# Patient Record
Sex: Female | Born: 1980 | Race: Black or African American | Hispanic: No | Marital: Single | Smoking: Current every day smoker
Health system: Southern US, Community
[De-identification: ages and names within clinical notes are randomized; demographics above are authoritative.]

## PROBLEM LIST (undated history)

## (undated) DIAGNOSIS — K8020 Calculus of gallbladder without cholecystitis without obstruction: Secondary | ICD-10-CM

## (undated) DIAGNOSIS — A549 Gonococcal infection, unspecified: Secondary | ICD-10-CM

## (undated) DIAGNOSIS — Z202 Contact with and (suspected) exposure to infections with a predominantly sexual mode of transmission: Secondary | ICD-10-CM

## (undated) DIAGNOSIS — R51 Headache: Secondary | ICD-10-CM

## (undated) DIAGNOSIS — D649 Anemia, unspecified: Secondary | ICD-10-CM

## (undated) DIAGNOSIS — R011 Cardiac murmur, unspecified: Secondary | ICD-10-CM

## (undated) HISTORY — PX: TUBAL LIGATION: SHX77

## (undated) HISTORY — PX: TONSILLECTOMY: SUR1361

---
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
## 2002-02-10 ENCOUNTER — Inpatient Hospital Stay (HOSPITAL_COMMUNITY): Admission: AD | Admit: 2002-02-10 | Discharge: 2002-02-10 | Payer: Self-pay | Admitting: Obstetrics and Gynecology

## 2002-03-07 ENCOUNTER — Inpatient Hospital Stay (HOSPITAL_COMMUNITY): Admission: AD | Admit: 2002-03-07 | Discharge: 2002-03-07 | Payer: Self-pay | Admitting: Obstetrics and Gynecology

## 2002-03-17 ENCOUNTER — Inpatient Hospital Stay (HOSPITAL_COMMUNITY): Admission: AD | Admit: 2002-03-17 | Discharge: 2002-03-17 | Payer: Self-pay | Admitting: Obstetrics and Gynecology

## 2002-03-20 ENCOUNTER — Inpatient Hospital Stay (HOSPITAL_COMMUNITY): Admission: AD | Admit: 2002-03-20 | Discharge: 2002-03-20 | Payer: Self-pay | Admitting: Obstetrics and Gynecology

## 2002-03-21 ENCOUNTER — Inpatient Hospital Stay (HOSPITAL_COMMUNITY): Admission: AD | Admit: 2002-03-21 | Discharge: 2002-03-21 | Payer: Self-pay | Admitting: Obstetrics and Gynecology

## 2002-03-30 ENCOUNTER — Inpatient Hospital Stay (HOSPITAL_COMMUNITY): Admission: AD | Admit: 2002-03-30 | Discharge: 2002-04-01 | Payer: Self-pay | Admitting: Obstetrics and Gynecology

## 2002-04-06 ENCOUNTER — Inpatient Hospital Stay (HOSPITAL_COMMUNITY): Admission: AD | Admit: 2002-04-06 | Discharge: 2002-04-06 | Payer: Self-pay | Admitting: Obstetrics and Gynecology

## 2002-11-22 ENCOUNTER — Ambulatory Visit (HOSPITAL_COMMUNITY): Admission: RE | Admit: 2002-11-22 | Discharge: 2002-11-22 | Payer: Self-pay | Admitting: Obstetrics and Gynecology

## 2002-11-22 ENCOUNTER — Other Ambulatory Visit: Admission: RE | Admit: 2002-11-22 | Discharge: 2002-11-22 | Payer: Self-pay | Admitting: Obstetrics and Gynecology

## 2002-11-22 ENCOUNTER — Encounter: Payer: Self-pay | Admitting: Obstetrics and Gynecology

## 2002-12-12 ENCOUNTER — Inpatient Hospital Stay (HOSPITAL_COMMUNITY): Admission: AD | Admit: 2002-12-12 | Discharge: 2002-12-12 | Payer: Self-pay | Admitting: Obstetrics and Gynecology

## 2003-01-30 ENCOUNTER — Inpatient Hospital Stay (HOSPITAL_COMMUNITY): Admission: AD | Admit: 2003-01-30 | Discharge: 2003-01-30 | Payer: Self-pay | Admitting: Obstetrics and Gynecology

## 2003-04-25 ENCOUNTER — Inpatient Hospital Stay (HOSPITAL_COMMUNITY): Admission: AD | Admit: 2003-04-25 | Discharge: 2003-04-25 | Payer: Self-pay | Admitting: Obstetrics and Gynecology

## 2003-05-22 ENCOUNTER — Inpatient Hospital Stay (HOSPITAL_COMMUNITY): Admission: AD | Admit: 2003-05-22 | Discharge: 2003-05-22 | Payer: Self-pay | Admitting: Obstetrics and Gynecology

## 2003-05-23 ENCOUNTER — Inpatient Hospital Stay (HOSPITAL_COMMUNITY): Admission: AD | Admit: 2003-05-23 | Discharge: 2003-05-23 | Payer: Self-pay | Admitting: Obstetrics and Gynecology

## 2003-05-24 ENCOUNTER — Inpatient Hospital Stay (HOSPITAL_COMMUNITY): Admission: AD | Admit: 2003-05-24 | Discharge: 2003-05-26 | Payer: Self-pay | Admitting: Obstetrics and Gynecology

## 2003-11-29 ENCOUNTER — Other Ambulatory Visit: Admission: RE | Admit: 2003-11-29 | Discharge: 2003-11-29 | Payer: Self-pay | Admitting: Obstetrics and Gynecology

## 2004-04-21 ENCOUNTER — Emergency Department (HOSPITAL_COMMUNITY): Admission: EM | Admit: 2004-04-21 | Discharge: 2004-04-21 | Payer: Self-pay | Admitting: Family Medicine

## 2004-06-12 ENCOUNTER — Inpatient Hospital Stay (HOSPITAL_COMMUNITY): Admission: RE | Admit: 2004-06-12 | Discharge: 2004-06-18 | Payer: Self-pay | Admitting: Psychiatry

## 2004-06-12 ENCOUNTER — Ambulatory Visit: Payer: Self-pay | Admitting: Psychiatry

## 2005-01-06 ENCOUNTER — Emergency Department (HOSPITAL_COMMUNITY): Admission: EM | Admit: 2005-01-06 | Discharge: 2005-01-06 | Payer: Self-pay | Admitting: Emergency Medicine

## 2005-11-22 ENCOUNTER — Encounter (INDEPENDENT_AMBULATORY_CARE_PROVIDER_SITE_OTHER): Payer: Self-pay | Admitting: *Deleted

## 2005-11-22 ENCOUNTER — Emergency Department (HOSPITAL_COMMUNITY): Admission: EM | Admit: 2005-11-22 | Discharge: 2005-11-22 | Payer: Self-pay | Admitting: Emergency Medicine

## 2005-12-04 ENCOUNTER — Inpatient Hospital Stay (HOSPITAL_COMMUNITY): Admission: AD | Admit: 2005-12-04 | Discharge: 2005-12-04 | Payer: Self-pay | Admitting: Obstetrics and Gynecology

## 2006-04-16 ENCOUNTER — Inpatient Hospital Stay (HOSPITAL_COMMUNITY): Admission: AD | Admit: 2006-04-16 | Discharge: 2006-04-16 | Payer: Self-pay | Admitting: Obstetrics and Gynecology

## 2006-04-16 ENCOUNTER — Encounter: Payer: Self-pay | Admitting: Vascular Surgery

## 2006-07-19 ENCOUNTER — Inpatient Hospital Stay (HOSPITAL_COMMUNITY): Admission: AD | Admit: 2006-07-19 | Discharge: 2006-07-22 | Payer: Self-pay | Admitting: Obstetrics and Gynecology

## 2006-09-04 ENCOUNTER — Ambulatory Visit (HOSPITAL_COMMUNITY): Admission: RE | Admit: 2006-09-04 | Discharge: 2006-09-04 | Payer: Self-pay | Admitting: Obstetrics and Gynecology

## 2007-09-03 ENCOUNTER — Emergency Department (HOSPITAL_COMMUNITY): Admission: EM | Admit: 2007-09-03 | Discharge: 2007-09-03 | Payer: Self-pay | Admitting: Emergency Medicine

## 2008-04-19 ENCOUNTER — Emergency Department (HOSPITAL_COMMUNITY): Admission: EM | Admit: 2008-04-19 | Discharge: 2008-04-19 | Payer: Self-pay | Admitting: Emergency Medicine

## 2008-10-05 ENCOUNTER — Emergency Department (HOSPITAL_COMMUNITY): Admission: EM | Admit: 2008-10-05 | Discharge: 2008-10-05 | Payer: Self-pay | Admitting: Emergency Medicine

## 2008-12-19 ENCOUNTER — Emergency Department (HOSPITAL_COMMUNITY): Admission: EM | Admit: 2008-12-19 | Discharge: 2008-12-20 | Payer: Self-pay | Admitting: Emergency Medicine

## 2010-06-12 ENCOUNTER — Inpatient Hospital Stay (HOSPITAL_COMMUNITY)
Admission: AD | Admit: 2010-06-12 | Discharge: 2010-06-12 | Disposition: A | Discharge status: Home / Self Care | Payer: Self-pay | Source: Ambulatory Visit | Attending: Obstetrics & Gynecology | Admitting: Obstetrics & Gynecology

## 2010-06-12 DIAGNOSIS — K089 Disorder of teeth and supporting structures, unspecified: Secondary | ICD-10-CM | POA: Insufficient documentation

## 2010-06-12 DIAGNOSIS — K120 Recurrent oral aphthae: Secondary | ICD-10-CM

## 2010-07-13 LAB — DIFFERENTIAL
Basophils Absolute: 0 10*3/uL (ref 0.0–0.1)
Basophils Relative: 0 % (ref 0–1)
Lymphocytes Relative: 32 % (ref 12–46)
Monocytes Absolute: 0.5 10*3/uL (ref 0.1–1.0)
Neutro Abs: 5.4 10*3/uL (ref 1.7–7.7)
Neutrophils Relative %: 62 % (ref 43–77)

## 2010-07-13 LAB — BASIC METABOLIC PANEL
Calcium: 8.7 mg/dL (ref 8.4–10.5)
Creatinine, Ser: 0.83 mg/dL (ref 0.4–1.2)
GFR calc Af Amer: >60 mL/min (ref >60)
GFR calc non Af Amer: >60 mL/min (ref >60)
Glucose, Bld: 98 mg/dL (ref 70–99)
Sodium: 134 mEq/L — ABNORMAL LOW (ref 135–145)

## 2010-07-13 LAB — CBC
Hemoglobin: 12.1 g/dL (ref 12.0–15.0)
RBC: 4.58 MIL/uL (ref 3.87–5.11)
RDW: 15.2 % (ref 11.5–15.5)

## 2010-07-13 LAB — URINE MICROSCOPIC-ADD ON

## 2010-07-13 LAB — URINALYSIS, ROUTINE W REFLEX MICROSCOPIC
Hgb urine dipstick: NEGATIVE
Nitrite: POSITIVE — AB
Protein, ur: NEGATIVE mg/dL
Specific Gravity, Urine: 1.014 (ref 1.005–1.030)
Urobilinogen, UA: 1 mg/dL (ref 0.0–1.0)

## 2010-07-13 LAB — URINE CULTURE

## 2010-07-16 LAB — POCT PREGNANCY, URINE: Preg Test, Ur: NEGATIVE

## 2010-07-16 LAB — CBC
Hemoglobin: 12.5 g/dL (ref 12.0–15.0)
RDW: 14.8 % (ref 11.5–15.5)

## 2010-07-16 LAB — URINALYSIS, ROUTINE W REFLEX MICROSCOPIC
Bilirubin Urine: NEGATIVE
Hgb urine dipstick: NEGATIVE
Nitrite: NEGATIVE
Specific Gravity, Urine: 1.014 (ref 1.005–1.030)
pH: 7.5 (ref 5.0–8.0)

## 2010-07-16 LAB — DIFFERENTIAL
Basophils Absolute: 0 10*3/uL (ref 0.0–0.1)
Lymphocytes Relative: 30 % (ref 12–46)
Monocytes Absolute: 0.6 10*3/uL (ref 0.1–1.0)
Neutro Abs: 5.1 10*3/uL (ref 1.7–7.7)
Neutrophils Relative %: 61 % (ref 43–77)

## 2010-07-16 LAB — BASIC METABOLIC PANEL
Calcium: 8.9 mg/dL (ref 8.4–10.5)
GFR calc Af Amer: >60 mL/min (ref >60)
GFR calc non Af Amer: >60 mL/min (ref >60)
Glucose, Bld: 66 mg/dL — ABNORMAL LOW (ref 70–99)
Sodium: 137 mEq/L (ref 135–145)

## 2010-07-16 LAB — URINE MICROSCOPIC-ADD ON

## 2010-07-16 LAB — GLUCOSE, CAPILLARY: Glucose-Capillary: 79 mg/dL (ref 70–99)

## 2010-08-21 NOTE — Op Note (Signed)
NAME:  Sabrina Harding, Sabrina Harding          ACCOUNT NO.:  [id number]   MEDICAL RECORD NO.:  [id number]          PATIENT TYPE:  AMB   LOCATION:  SDC                           FACILITY:  [facility]   PHYSICIAN:  Janine Limbo, M.D.DATE OF BIRTH:  [birth year]   DATE OF PROCEDURE:  09/04/2006  DATE OF DISCHARGE:                               OPERATIVE REPORT   PREOPERATIVE DIAGNOSIS:  1. Desires sterilization.   POSTOPERATIVE DIAGNOSIS:  1. Desires sterilization.   PROCEDURE:  Laparoscopic tubal cautery.   SURGEON:  Dr. Leonard Schwartz.   FIRST ASSISTANT:  None.   ANESTHETIC:  General.   DISPOSITION:  [name] is a 30 year old female, now para 3-0-1-3,  who desires sterilization.  She understands the indications for her  surgical procedure and she accepts the risks of, but not limited to,  anesthetic complications, bleeding, infection, possible damage to  surrounding organs, and possible tubal failure (24 per 1000).   FINDINGS:  The uterus, fallopian tubes, and ovaries were normal.  A  bowel and the upper abdomen appeared normal.   PROCEDURE:  The patient was taken to the operating room where a general  anesthetic was given.  The patient's abdomen was prepped with multiple  layers of Betadine as was the perineum and vagina.  Bladder was drained.  A Hulka tenaculum was placed inside the uterus.  The patient was  sterilely draped.  The subumbilical area was injected with 5 mL of half  percent Marcaine with epinephrine.  The subumbilical incision was made  and the Veress needle was inserted without difficulty.  Proper placement  was firmed using saline drop test.  A pneumoperitoneum was then  obtained.  The laparoscopic trocar and then the laparoscope were  substituted for the Veress needle.  The pelvis was visualized with  findings as mentioned above.  The right fallopian tube was identified  and followed to its fimbriated end.  The proximal portion of the right  fallopian  tube was cauterized in several segments using the bipolar  cautery.  Care was taken not to damage any of the surrounding organs.  Hemostasis was adequate.  An identical procedure was carried out on the  opposite side.  Again hemostasis was adequate.  The patient was given  Ancef 1 gram IV prior to her procedure.  She was given Toradol 30 mg IV  and 30 mg IM during the operative procedure.  All instruments were  removed.  The subumbilical fascia was closed using a figure-of-eight  suture of 0-0 Vicryl.  The skin was closed using a subcuticular suture  of 3-0 Monocryl.  Sponge, needle, instrument counts were correct on two  occasions.  Estimated blood loss for the procedure was 5 mL.  The  patient tolerated her procedure well.  She was awakened without  difficulty and taken to the recovery room in stable condition.   Follow-up instructions.  The patient was given a prescription for  Vicodin and she can take one or two tablets every four hours as needed  for severe pain.  She can take ibuprofen 600 mg every 6 hours as needed  for mild to  moderate  pain.  She will return to see Dr. Stefano Gaul in two to three weeks for  follow-up examination.  She was given a copy of the postoperative  instruction sheet as prepared by the [facility] for  patients who have undergone laparoscopy.      Janine Limbo, M.D.  Electronically Signed     AVS/MEDQ  D:  09/04/2006  T:  09/04/2006  Job:  [id number]

## 2010-08-21 NOTE — H&P (Signed)
NAME:  Sabrina Harding, Sabrina Harding NO.:  [id number]   MEDICAL RECORD NO.:  [id number]          PATIENT TYPE:  AMB   LOCATION:  SDC                           [facility]   PHYSICIAN:  Janine Limbo, M.D.DATE OF BIRTH:  [birth year]   DATE OF ADMISSION:  09/04/2006  DATE OF DISCHARGE:                              HISTORY & PHYSICAL   HISTORY OF PRESENT ILLNESS:  Sabrina Harding is a 30 year old female, now  para 3, 0-1-3, who presents for a laparoscopic tubal cautery.  The  patient has been followed at the [facility].   OBSTETRICAL HISTORY:  On July 20, 2006, the patient had a vaginal  delivery of a healthy female infant.  In 2005, the patient had a vaginal  delivery at term of a healthy female infant.  In 2003, the patient had a  vaginal delivery at term of a healthy female infant.  In 2000, the  patient had a first trimester miscarriage.   PAST MEDICAL HISTORY:  The patient has a history of Chlamydia dating  back to November, 2007.  She was appropriately treated.  Follow-up  cultures were negative.  The patient has a history of depression, but  she is currently not taking any medication.  The patient has a history  of sickle cell trait.   DRUG ALLERGIES:  No known drug allergies.   SOCIAL HISTORY:  The patient denies cigarette use, alcohol use, and  recreational drug use.   REVIEW OF SYSTEMS:  Noncontributory.   FAMILY HISTORY:  Noncontributory.   PHYSICAL EXAMINATION:  VITAL SIGNS:  Height is 5 feet 4 inches.  Weight  is 234 pounds.  HEENT:  Within normal limits.  CHEST:  Clear.  HEART:  Regular rate and rhythm.  BREASTS:  Without masses.  ABDOMEN:  Nontender.  EXTREMITIES:  Grossly normal.  NEUROLOGIC:  Grossly normal.  PELVIC:  External genitalia is normal.  Vagina is normal.  Cervix is  nontender.  Uterus is normal size, shape, and consistency.  Adnexa:  No  masses.   ASSESSMENT:  1. Desires sterilization.  2. Obesity.   PLAN:  The patient will undergo a laparoscopic tubal cautery.  She  understands the indications for her surgical procedure, and she accepts  the risks of but not limited to anesthetic complications, bleeding,  infection, possible damage to the surrounding organs, and possible tubal  failure (17 per 1000).      Janine Limbo, M.D.  Electronically Signed     AVS/MEDQ  D:  09/03/2006  T:  09/03/2006  Job:  [id number]

## 2010-08-24 NOTE — Discharge Summary (Signed)
NAME:  Sabrina Harding, KANTOR NO.:  [id number]   MEDICAL RECORD NO.:  [id number]          PATIENT TYPE:  IPS   LOCATION:  0301                          [facility]ACILITY:  [facility]   PHYSICIAN:  Jeanice Lim, M.D. DATE OF BIRTH:  01-04-1981   DATE OF ADMISSION:  06/12/2004  DATE OF DISCHARGE:  06/18/2004                                 DISCHARGE SUMMARY   IDENTIFYING DATA:  A 30 year old African-American female, single,  voluntarily admitted.  Mother of 2 daughters.  Presented with suicidal  thoughts.   ADMISSION MEDICATIONS:  None.   ALLERGIES:  No known drug allergies.   PHYSICAL EXAMINATION:  Within normal limits, neurologically nonfocal.   ROUTINE ADMISSION LABS:  Within normal limits.   MENTAL STATUS EXAM:  Fully alert, pleasant, cooperative, restricted affect,  appropriate.  Speech within normal limits.  Mood depressed, hopeless.  Thought processes goal directed, positive suicide ideation with plan.  Cognitively intact.  Judgment and insight fair.  Contracting for safety in  the hospital.   ADMISSION DIAGNOSES:   AXIS I:  1.  Major depressive disorder, recurrent severe.  2.  Rule out psychosis.   AXIS II:  Deferred.   AXIS III:  None.   AXIS IV:  Severe, financial and child stress, occupation and economic  issues.   AXIS V:  35/60.   HOSPITAL COURSE:  The patient was admitted and ordered routine p.r.n.  medications, underwent further monitoring, and was encouraged to participate  in individual, group and milieu therapy.  Pregnancy test, urine drug screen  and Risperdal and Celexa started to target depressive symptoms and thought  symptoms.  The patient reported suicide ideation, not sleeping well,  isolating and positive voices.  The patient reported  gradual decrease in  ruminating suicidal thoughts and more out of the room as mood improved.  A  family session was held and the patient had good support system and reported  a good plan and healthier coping  skills regarding her depression, showing a  positive response and tolerance to medications and crisis intervention.  The  patient was given medication education and discharged on:  1.  Ambien 10 mg q.h.s.  2.  Motrin 600 mg p.o. p.r.n.  3.  Depakote 250 mg 5 q.8 p.m.  4.  Celexa 20 mg q.a.m.  5.  Wellbutrin XL 150 mg q.a.m.  6.  Trazodone 150 mg q.8 p.m.  7.  Klonopin 0.5 mg 1/2 q.8 p.m.   DISPOSITION:  The patient discharged to follow up at [facility] on March  14 at 10 a.m.  Discharged in improved condition.   DISCHARGE DIAGNOSES:   AXIS I:  1.  Major depressive disorder, recurrent severe.  2.  Rule out psychosis.   AXIS II:  Deferred.   AXIS III:  None.   AXIS IV:  Severe, financial and child stress, occupation and economic  issues.   AXIS V:  Global assessment of function on discharge was 55.      JEM/MEDQ  D:  07/26/2004  T:  07/26/2004  Job:  [id number]
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
## 2010-10-07 ENCOUNTER — Emergency Department (HOSPITAL_COMMUNITY)
Admission: EM | Admit: 2010-10-07 | Discharge: 2010-10-08 | Disposition: A | Discharge status: Home / Self Care | Payer: Self-pay | Attending: Emergency Medicine | Admitting: Emergency Medicine

## 2010-10-07 DIAGNOSIS — M25469 Effusion, unspecified knee: Secondary | ICD-10-CM | POA: Insufficient documentation

## 2010-10-07 DIAGNOSIS — M7989 Other specified soft tissue disorders: Secondary | ICD-10-CM | POA: Insufficient documentation

## 2010-10-07 DIAGNOSIS — D573 Sickle-cell trait: Secondary | ICD-10-CM | POA: Insufficient documentation

## 2010-10-07 DIAGNOSIS — J45909 Unspecified asthma, uncomplicated: Secondary | ICD-10-CM | POA: Insufficient documentation

## 2010-10-07 DIAGNOSIS — M25569 Pain in unspecified knee: Secondary | ICD-10-CM | POA: Insufficient documentation

## 2010-10-07 DIAGNOSIS — R609 Edema, unspecified: Secondary | ICD-10-CM | POA: Insufficient documentation

## 2010-10-18 ENCOUNTER — Emergency Department (HOSPITAL_COMMUNITY): Payer: Self-pay

## 2010-10-18 ENCOUNTER — Emergency Department (HOSPITAL_COMMUNITY)
Admission: EM | Admit: 2010-10-18 | Discharge: 2010-10-18 | Disposition: A | Discharge status: Home / Self Care | Payer: Self-pay | Attending: Emergency Medicine | Admitting: Emergency Medicine

## 2010-10-18 DIAGNOSIS — M7989 Other specified soft tissue disorders: Secondary | ICD-10-CM | POA: Insufficient documentation

## 2010-10-18 DIAGNOSIS — J45909 Unspecified asthma, uncomplicated: Secondary | ICD-10-CM | POA: Insufficient documentation

## 2010-10-18 DIAGNOSIS — D573 Sickle-cell trait: Secondary | ICD-10-CM | POA: Insufficient documentation

## 2010-10-18 DIAGNOSIS — M25569 Pain in unspecified knee: Secondary | ICD-10-CM | POA: Insufficient documentation

## 2011-01-02 LAB — URINALYSIS, ROUTINE W REFLEX MICROSCOPIC
Bilirubin Urine: NEGATIVE
Glucose, UA: NEGATIVE
Hgb urine dipstick: NEGATIVE
Ketones, ur: NEGATIVE
Nitrite: POSITIVE — AB
Protein, ur: NEGATIVE
Specific Gravity, Urine: 1.007
Urobilinogen, UA: 0.2
pH: 6

## 2011-01-02 LAB — CBC
MCV: 77.4 — ABNORMAL LOW
RBC: 4.59
WBC: 8.3

## 2011-01-02 LAB — POCT PREGNANCY, URINE
Operator id: 272551
Preg Test, Ur: NEGATIVE

## 2011-01-02 LAB — COMPREHENSIVE METABOLIC PANEL
ALT: 15
AST: 19
Albumin: 3.6
CO2: 27
Chloride: 103
GFR calc Af Amer: >60
GFR calc non Af Amer: >60
Potassium: 4
Sodium: 135
Total Bilirubin: 0.6

## 2011-01-02 LAB — URINE MICROSCOPIC-ADD ON

## 2011-01-02 LAB — DIFFERENTIAL
Basophils Absolute: 0
Eosinophils Absolute: 0.1
Eosinophils Relative: 1
Monocytes Absolute: 0.6

## 2011-01-02 LAB — D-DIMER, QUANTITATIVE: D-Dimer, Quant: 0.82 — ABNORMAL HIGH

## 2011-10-01 ENCOUNTER — Other Ambulatory Visit: Payer: Self-pay

## 2011-10-17 ENCOUNTER — Encounter (HOSPITAL_COMMUNITY)
Admission: EM | Disposition: A | Discharge status: Home / Self Care | Payer: Self-pay | Source: Home / Self Care | Attending: Emergency Medicine

## 2011-10-17 ENCOUNTER — Encounter (HOSPITAL_COMMUNITY): Payer: Self-pay | Admitting: General Practice

## 2011-10-17 ENCOUNTER — Ambulatory Visit (HOSPITAL_COMMUNITY): Payer: Self-pay

## 2011-10-17 ENCOUNTER — Encounter (HOSPITAL_COMMUNITY): Payer: Self-pay | Admitting: Anesthesiology

## 2011-10-17 ENCOUNTER — Emergency Department (HOSPITAL_COMMUNITY): Payer: Self-pay

## 2011-10-17 ENCOUNTER — Ambulatory Visit (HOSPITAL_COMMUNITY)
Admission: EM | Admit: 2011-10-17 | Discharge: 2011-10-19 | Disposition: A | Discharge status: Home / Self Care | Payer: MEDICAID | Attending: Emergency Medicine | Admitting: Emergency Medicine

## 2011-10-17 ENCOUNTER — Encounter (HOSPITAL_COMMUNITY): Payer: Self-pay | Admitting: Emergency Medicine

## 2011-10-17 ENCOUNTER — Emergency Department (HOSPITAL_COMMUNITY): Payer: Self-pay | Admitting: Anesthesiology

## 2011-10-17 DIAGNOSIS — K810 Acute cholecystitis: Secondary | ICD-10-CM

## 2011-10-17 DIAGNOSIS — K8010 Calculus of gallbladder with chronic cholecystitis without obstruction: Secondary | ICD-10-CM | POA: Insufficient documentation

## 2011-10-17 HISTORY — DX: Cardiac murmur, unspecified: R01.1

## 2011-10-17 HISTORY — DX: Calculus of gallbladder without cholecystitis without obstruction: K80.20

## 2011-10-17 HISTORY — DX: Anemia, unspecified: D64.9

## 2011-10-17 HISTORY — PX: CHOLECYSTECTOMY: SHX55

## 2011-10-17 HISTORY — DX: Headache: R51

## 2011-10-17 LAB — CBC
MCHC: 33.4 g/dL (ref 30.0–36.0)
MCV: 76 fL — ABNORMAL LOW (ref 78.0–100.0)
Platelets: 334 10*3/uL (ref 150–400)
RDW: 15.8 % — ABNORMAL HIGH (ref 11.5–15.5)
WBC: 7.1 10*3/uL (ref 4.0–10.5)

## 2011-10-17 LAB — DIFFERENTIAL
Basophils Absolute: 0 10*3/uL (ref 0.0–0.1)
Basophils Relative: 0 % (ref 0–1)
Eosinophils Absolute: 0.2 10*3/uL (ref 0.0–0.7)
Eosinophils Relative: 2 % (ref 0–5)
Lymphocytes Relative: 35 % (ref 12–46)

## 2011-10-17 LAB — COMPREHENSIVE METABOLIC PANEL
ALT: 10 U/L (ref 0–35)
AST: 11 U/L (ref 0–37)
Albumin: 3.2 g/dL — ABNORMAL LOW (ref 3.5–5.2)
CO2: 25 mEq/L (ref 19–32)
Calcium: 9.7 mg/dL (ref 8.4–10.5)
Creatinine, Ser: 1.1 mg/dL (ref 0.50–1.10)
Sodium: 138 mEq/L (ref 135–145)
Total Protein: 7.1 g/dL (ref 6.0–8.3)

## 2011-10-17 LAB — URINALYSIS, ROUTINE W REFLEX MICROSCOPIC
Glucose, UA: NEGATIVE mg/dL
Hgb urine dipstick: NEGATIVE
Specific Gravity, Urine: 1.013 (ref 1.005–1.030)
pH: 6 (ref 5.0–8.0)

## 2011-10-17 LAB — URINE MICROSCOPIC-ADD ON

## 2011-10-17 LAB — CARDIAC PANEL(CRET KIN+CKTOT+MB+TROPI)
CK, MB: 1.3 ng/mL (ref 0.3–4.0)
Relative Index: 1.3 (ref 0.0–2.5)
Total CK: 104 U/L (ref 7–177)
Troponin I: <0.3 ng/mL (ref <0.30)

## 2011-10-17 LAB — POCT PREGNANCY, URINE: Preg Test, Ur: NEGATIVE

## 2011-10-17 SURGERY — LAPAROSCOPIC CHOLECYSTECTOMY
Anesthesia: General | Site: Abdomen | Wound class: Clean Contaminated

## 2011-10-17 MED ORDER — ENOXAPARIN SODIUM 40 MG/0.4ML ~~LOC~~ SOLN
40.0000 mg | SUBCUTANEOUS | Status: DC
  Administered 2011-10-18: 40 mg via SUBCUTANEOUS
  Filled 2011-10-17 (×2): qty 0.4

## 2011-10-17 MED ORDER — 0.9 % SODIUM CHLORIDE (POUR BTL) OPTIME
TOPICAL | Status: DC | PRN
  Administered 2011-10-17: 1000 mL

## 2011-10-17 MED ORDER — KETOROLAC TROMETHAMINE 30 MG/ML IJ SOLN
INTRAMUSCULAR | Status: AC
  Filled 2011-10-17: qty 1

## 2011-10-17 MED ORDER — KETOROLAC TROMETHAMINE 30 MG/ML IJ SOLN
30.0000 mg | Freq: Four times a day (QID) | INTRAMUSCULAR | Status: AC
  Administered 2011-10-17 (×2): 30 mg via INTRAVENOUS
  Filled 2011-10-17: qty 1

## 2011-10-17 MED ORDER — ONDANSETRON HCL 4 MG/2ML IJ SOLN
INTRAMUSCULAR | Status: DC | PRN
  Administered 2011-10-17: 4 mg via INTRAVENOUS

## 2011-10-17 MED ORDER — BUPIVACAINE-EPINEPHRINE 0.25% -1:200000 IJ SOLN
INTRAMUSCULAR | Status: DC | PRN
  Administered 2011-10-17: 20 mL

## 2011-10-17 MED ORDER — HYDROMORPHONE HCL PF 1 MG/ML IJ SOLN
INTRAMUSCULAR | Status: AC
  Filled 2011-10-17: qty 1

## 2011-10-17 MED ORDER — MIDAZOLAM HCL 5 MG/5ML IJ SOLN
INTRAMUSCULAR | Status: DC | PRN
  Administered 2011-10-17 (×2): 1 mg via INTRAVENOUS

## 2011-10-17 MED ORDER — SODIUM CHLORIDE 0.9 % IR SOLN
Status: DC | PRN
  Administered 2011-10-17: 1

## 2011-10-17 MED ORDER — HYDROMORPHONE HCL PF 1 MG/ML IJ SOLN
1.0000 mg | Freq: Once | INTRAMUSCULAR | Status: AC
  Administered 2011-10-17: 1 mg via INTRAVENOUS
  Filled 2011-10-17: qty 1

## 2011-10-17 MED ORDER — MORPHINE SULFATE 4 MG/ML IJ SOLN
4.0000 mg | INTRAMUSCULAR | Status: DC | PRN
  Administered 2011-10-17 (×2): 4 mg via INTRAVENOUS
  Filled 2011-10-17 (×2): qty 1

## 2011-10-17 MED ORDER — ONDANSETRON HCL 4 MG/2ML IJ SOLN
4.0000 mg | Freq: Four times a day (QID) | INTRAMUSCULAR | Status: DC | PRN
  Administered 2011-10-18 (×2): 4 mg via INTRAVENOUS
  Filled 2011-10-17 (×2): qty 2

## 2011-10-17 MED ORDER — LACTATED RINGERS IV SOLN
INTRAVENOUS | Status: DC | PRN
  Administered 2011-10-17 (×2): via INTRAVENOUS

## 2011-10-17 MED ORDER — ONDANSETRON HCL 4 MG/2ML IJ SOLN
4.0000 mg | Freq: Once | INTRAMUSCULAR | Status: AC
  Administered 2011-10-17: 4 mg via INTRAVENOUS
  Filled 2011-10-17: qty 2

## 2011-10-17 MED ORDER — NAPROXEN 500 MG PO TABS
500.0000 mg | ORAL_TABLET | Freq: Three times a day (TID) | ORAL | Status: DC | PRN
  Administered 2011-10-18: 500 mg via ORAL
  Filled 2011-10-17 (×2): qty 1

## 2011-10-17 MED ORDER — HYDROCODONE-ACETAMINOPHEN 5-325 MG PO TABS
1.0000 | ORAL_TABLET | ORAL | Status: DC | PRN
  Administered 2011-10-18 – 2011-10-19 (×6): 2 via ORAL
  Filled 2011-10-17 (×6): qty 2

## 2011-10-17 MED ORDER — ONDANSETRON HCL 4 MG/2ML IJ SOLN: 4.0000 mg | Freq: Once | INTRAMUSCULAR | Status: DC | PRN

## 2011-10-17 MED ORDER — HYDROMORPHONE HCL PF 1 MG/ML IJ SOLN
0.2500 mg | INTRAMUSCULAR | Status: DC | PRN
  Administered 2011-10-17 (×2): 0.5 mg via INTRAVENOUS

## 2011-10-17 MED ORDER — ONDANSETRON HCL 4 MG PO TABS
4.0000 mg | ORAL_TABLET | Freq: Four times a day (QID) | ORAL | Status: DC | PRN
  Administered 2011-10-17: 4 mg via ORAL
  Filled 2011-10-17: qty 1

## 2011-10-17 MED ORDER — POTASSIUM CHLORIDE IN NACL 20-0.9 MEQ/L-% IV SOLN
INTRAVENOUS | Status: DC
  Administered 2011-10-17 – 2011-10-19 (×4): via INTRAVENOUS
  Filled 2011-10-17 (×6): qty 1000

## 2011-10-17 MED ORDER — NEOSTIGMINE METHYLSULFATE 1 MG/ML IJ SOLN
INTRAMUSCULAR | Status: DC | PRN
  Administered 2011-10-17: 1 mg via INTRAVENOUS
  Administered 2011-10-17: 3 mg via INTRAVENOUS

## 2011-10-17 MED ORDER — CEFAZOLIN SODIUM-DEXTROSE 2-3 GM-% IV SOLR
2.0000 g | INTRAVENOUS | Status: AC
  Administered 2011-10-17: 2 g via INTRAVENOUS

## 2011-10-17 MED ORDER — GLYCOPYRROLATE 0.2 MG/ML IJ SOLN
INTRAMUSCULAR | Status: DC | PRN
  Administered 2011-10-17: 0.4 mg via INTRAVENOUS
  Administered 2011-10-17: 0.1 mg via INTRAVENOUS

## 2011-10-17 MED ORDER — ROCURONIUM BROMIDE 100 MG/10ML IV SOLN
INTRAVENOUS | Status: DC | PRN
  Administered 2011-10-17 (×2): 10 mg via INTRAVENOUS

## 2011-10-17 MED ORDER — FENTANYL CITRATE 0.05 MG/ML IJ SOLN
INTRAMUSCULAR | Status: DC | PRN
  Administered 2011-10-17 (×4): 50 ug via INTRAVENOUS

## 2011-10-17 MED ORDER — SUCCINYLCHOLINE CHLORIDE 20 MG/ML IJ SOLN
INTRAMUSCULAR | Status: DC | PRN
  Administered 2011-10-17: 100 mg via INTRAVENOUS

## 2011-10-17 MED ORDER — SODIUM CHLORIDE 0.9 % IV SOLN
INTRAVENOUS | Status: DC
  Administered 2011-10-17: 10:00:00 via INTRAVENOUS

## 2011-10-17 MED ORDER — PROPOFOL 10 MG/ML IV EMUL
INTRAVENOUS | Status: DC | PRN
  Administered 2011-10-17: 40 mg via INTRAVENOUS
  Administered 2011-10-17: 90 mg via INTRAVENOUS

## 2011-10-17 SURGICAL SUPPLY — 43 items
APPLIER CLIP 5 13 M/L LIGAMAX5 (MISCELLANEOUS) ×2
BANDAGE ADHESIVE 1X3 (GAUZE/BANDAGES/DRESSINGS) ×8 IMPLANT
BENZOIN TINCTURE PRP APPL 2/3 (GAUZE/BANDAGES/DRESSINGS) ×2 IMPLANT
CANISTER SUCTION 2500CC (MISCELLANEOUS) ×2 IMPLANT
CHLORAPREP W/TINT 26ML (MISCELLANEOUS) ×2 IMPLANT
CLIP APPLIE 5 13 M/L LIGAMAX5 (MISCELLANEOUS) ×1 IMPLANT
CLOTH BEACON ORANGE TIMEOUT ST (SAFETY) ×2 IMPLANT
COVER MAYO STAND STRL (DRAPES) IMPLANT
COVER SURGICAL LIGHT HANDLE (MISCELLANEOUS) ×2 IMPLANT
DECANTER SPIKE VIAL GLASS SM (MISCELLANEOUS) ×2 IMPLANT
DRAPE C-ARM 42X72 X-RAY (DRAPES) IMPLANT
DRSG TEGADERM 4X4.75 (GAUZE/BANDAGES/DRESSINGS) ×2 IMPLANT
ELECT REM PT RETURN 9FT ADLT (ELECTROSURGICAL) ×2
ELECTRODE REM PT RTRN 9FT ADLT (ELECTROSURGICAL) ×1 IMPLANT
GAUZE SPONGE 2X2 8PLY STRL LF (GAUZE/BANDAGES/DRESSINGS) ×1 IMPLANT
GLOVE BIO SURGEON STRL SZ7 (GLOVE) ×2 IMPLANT
GLOVE BIO SURGEON STRL SZ7.5 (GLOVE) ×2 IMPLANT
GLOVE BIOGEL PI IND STRL 7.0 (GLOVE) ×1 IMPLANT
GLOVE BIOGEL PI IND STRL 7.5 (GLOVE) ×1 IMPLANT
GLOVE BIOGEL PI INDICATOR 7.0 (GLOVE) ×1
GLOVE BIOGEL PI INDICATOR 7.5 (GLOVE) ×1
GLOVE SURG SIGNA 7.5 PF LTX (GLOVE) ×2 IMPLANT
GOWN PREVENTION PLUS XLARGE (GOWN DISPOSABLE) ×2 IMPLANT
GOWN STRL NON-REIN LRG LVL3 (GOWN DISPOSABLE) ×6 IMPLANT
KIT BASIN OR (CUSTOM PROCEDURE TRAY) ×2 IMPLANT
KIT ROOM TURNOVER OR (KITS) ×2 IMPLANT
NS IRRIG 1000ML POUR BTL (IV SOLUTION) ×2 IMPLANT
PAD ARMBOARD 7.5X6 YLW CONV (MISCELLANEOUS) ×4 IMPLANT
POUCH SPECIMEN RETRIEVAL 10MM (ENDOMECHANICALS) ×2 IMPLANT
SCISSORS LAP 5X35 DISP (ENDOMECHANICALS) ×2 IMPLANT
SET CHOLANGIOGRAPH 5 50 .035 (SET/KITS/TRAYS/PACK) IMPLANT
SET IRRIG TUBING LAPAROSCOPIC (IRRIGATION / IRRIGATOR) ×2 IMPLANT
SLEEVE ENDOPATH XCEL 5M (ENDOMECHANICALS) ×4 IMPLANT
SPECIMEN JAR SMALL (MISCELLANEOUS) ×2 IMPLANT
SPONGE GAUZE 2X2 STER 10/PKG (GAUZE/BANDAGES/DRESSINGS) ×1
STRIP CLOSURE SKIN 1/2X4 (GAUZE/BANDAGES/DRESSINGS) ×2 IMPLANT
SUT MON AB 4-0 PC3 18 (SUTURE) ×2 IMPLANT
TOWEL OR 17X24 6PK STRL BLUE (TOWEL DISPOSABLE) ×2 IMPLANT
TOWEL OR 17X26 10 PK STRL BLUE (TOWEL DISPOSABLE) ×2 IMPLANT
TRAY LAPAROSCOPIC (CUSTOM PROCEDURE TRAY) ×2 IMPLANT
TROCAR XCEL BLUNT TIP 100MML (ENDOMECHANICALS) ×2 IMPLANT
TROCAR XCEL NON-BLD 5MMX100MML (ENDOMECHANICALS) ×2 IMPLANT
WATER STERILE IRR 1000ML POUR (IV SOLUTION) IMPLANT

## 2011-10-17 NOTE — Anesthesia Procedure Notes (Signed)
Procedure Name: Intubation Date/Time: 10/17/2011 12:58 PM Performed by: Darcey Nora B Pre-anesthesia Checklist: Patient identified, Emergency Drugs available, Suction available and Patient being monitored Patient Re-evaluated:Patient Re-evaluated prior to inductionOxygen Delivery Method: Circle system utilized Preoxygenation: Pre-oxygenation with 100% oxygen Intubation Type: IV induction Ventilation: Oral airway inserted - appropriate to patient size and Mask ventilation without difficulty Laryngoscope Size: Mac and 3 Grade View: Grade II Tube type: Oral Number of attempts: 1 Airway Equipment and Method: Stylet Placement Confirmation: ETT inserted through vocal cords under direct vision,  breath sounds checked- equal and bilateral and positive ETCO2 Dental Injury: Teeth and Oropharynx as per pre-operative assessment

## 2011-10-17 NOTE — Progress Notes (Signed)
Called by primary RN to see pt with c/o chest pain after ambulation.  On arrival pt resting in bed & after having rcvd 4mg  Morphine IV.  Pt very sleepy but able to answer questions.  Pt states pain is in area of xiphoid, Lt clavicle, Rt mid chest under breast & moving to her back, plus Rt flank.  EKG without changes from previous.  Prob pain related to lap surgery.  MD aware.  Pending CXR & cardiac enzymes.  Will cont. To monitor.  Discussed with primary nurse importance of monitoring RR & O2 sats after Morphine due to increased sleepiness. Will cont. To monitor

## 2011-10-17 NOTE — ED Notes (Signed)
Waiting for MD to perform pelvic exam. No needs at this time. Pain still under control.

## 2011-10-17 NOTE — ED Notes (Signed)
Surgeon at bedside speaking with pt. 

## 2011-10-17 NOTE — ED Notes (Signed)
Patient transported to Ultrasound 

## 2011-10-17 NOTE — Anesthesia Preprocedure Evaluation (Signed)
Anesthesia Evaluation  Patient identified by MRN, date of birth, ID band  Reviewed: Allergy & Precautions, H&P , NPO status , Patient's Chart, lab work & pertinent test results  Airway Mallampati: I TM Distance: >3 FB Neck ROM: Full    Dental   Pulmonary          Cardiovascular     Neuro/Psych    GI/Hepatic   Endo/Other    Renal/GU      Musculoskeletal   Abdominal   Peds  Hematology   Anesthesia Other Findings   Reproductive/Obstetrics                           Anesthesia Physical Anesthesia Plan  ASA: II  Anesthesia Plan: General   Post-op Pain Management:    Induction: Intravenous  Airway Management Planned: Oral ETT  Additional Equipment:   Intra-op Plan:   Post-operative Plan: Extubation in OR  Informed Consent: I have reviewed the patients History and Physical, chart, labs and discussed the procedure including the risks, benefits and alternatives for the proposed anesthesia with the patient or authorized representative who has indicated his/her understanding and acceptance.     Plan Discussed with: CRNA and Surgeon  Anesthesia Plan Comments:         Anesthesia Quick Evaluation  

## 2011-10-17 NOTE — ED Notes (Signed)
Pt reports RLQ abdominal pain for the last two days radiating to back and flank area. Denies fevers, vomiting, changes in bowel movements c/o nausea. Pt with 20g IV left wrist. Prev hx gall stones, tubal ligation.

## 2011-10-17 NOTE — ED Provider Notes (Cosign Needed Addendum)
History     CSN: [id number]  Arrival date & time 10/17/11  0849   First MD Initiated Contact with Patient 10/17/11 [phone]      Chief Complaint  Patient presents with  . Abdominal Pain    (Consider location/radiation/quality/duration/timing/severity/associated sxs/prior treatment) HPI Comments: Patient is a 31 year old woman who complains of abdominal pain. It seemed to start in her periumbilical region and one into the right lower abdomen. Also though she feels it is her upper abdomen and in the right flank. The pain started yesterday it was not very bad. Tearing the night got much worse. She had nausea but no vomiting. There was no diarrhea and no dysuria. Her last menstrual period was June 25 was a normal period for her she has had a prior tubal ligation. She says that in the past she was diagnosed with gallstones in 2009, but did fairly did not pursue treatment at that time.  Patient is a 31 y.o. female presenting with abdominal pain. The history is provided by the patient.  Abdominal Pain The primary symptoms of the illness include abdominal pain and nausea. The primary symptoms of the illness do not include fever, vomiting or diarrhea. The current episode started yesterday. The onset of the illness was gradual. The problem has been gradually worsening.  Associated with: Nothing. The patient states that she believes she is currently not pregnant. The patient has not had a change in bowel habit. Risk factors: Prior tubal ligation. Symptoms associated with the illness do not include chills. Significant associated medical issues include gallstones.    Past Medical History  Diagnosis Date  . Gall stones     Past Surgical History  Procedure Date  . Tubal ligation     History reviewed. No pertinent family history.  History  Substance Use Topics  . Smoking status: Current Everyday Smoker -- 0.5 packs/day for 5 years    Types: Cigarettes  . Smokeless tobacco: Not on file  . Alcohol  Use: Yes     occasional    OB History    Grav Para Term Preterm Abortions TAB SAB Ect Mult Living                  Review of Systems  Constitutional: Negative.  Negative for fever and chills.  HENT: Negative.   Eyes: Negative.   Respiratory: Negative.   Cardiovascular: Negative.   Gastrointestinal: Positive for nausea and abdominal pain. Negative for vomiting and diarrhea.  Genitourinary: Negative.   Musculoskeletal: Negative.   Skin: Negative.   Neurological: Negative.   Psychiatric/Behavioral: Negative.     Allergies  Review of patient's allergies indicates no known allergies.  Home Medications   Current Outpatient Rx  Name Route Sig Dispense Refill  . NAPROXEN SODIUM 220 MG PO TABS Oral Take 440-660 mg by mouth 4 (four) times daily as needed. For pain      BP 123/62  Pulse 67  Temp 98.2 F (36.8 C) (Oral)  Resp 20  SpO2 100%  LMP 10/01/2011  Physical Exam  Nursing note and vitals reviewed. Constitutional: She is oriented to person, place, and time. She appears well-developed and well-nourished. Distressed: in mild to moderate distress with abdominal pain.  HENT:  Head: Normocephalic and atraumatic.  Right Ear: External ear normal.  Left Ear: External ear normal.  Mouth/Throat: Oropharynx is clear and moist.  Eyes: Conjunctivae and EOM are normal. Pupils are equal, round, and reactive to light. No scleral icterus.  Neck: Normal range of motion. Neck  supple.  Cardiovascular: Normal rate, regular rhythm and normal heart sounds.   Pulmonary/Chest: Effort normal and breath sounds normal.  Abdominal: Soft.       Right upper quadrant tenderness, no mass rebound or rigidity.  Musculoskeletal: Normal range of motion. She exhibits no edema and no tenderness.  Neurological: She is alert and oriented to person, place, and time.       No sensory or motor deficit.  Skin: Skin is warm and dry.  Psychiatric: She has a normal mood and affect. Her behavior is normal.     ED Course  Procedures (including critical care time)  Labs Reviewed  CBC - Abnormal; Notable for the following:    MCV 76.0 (*)     MCH 25.4 (*)     RDW 15.8 (*)     All other components within normal limits  COMPREHENSIVE METABOLIC PANEL - Abnormal; Notable for the following:    Albumin 3.2 (*)     Total Bilirubin 0.1 (*)     GFR calc non Af Amer 67 (*)     GFR calc Af Amer 78 (*)     All other components within normal limits  DIFFERENTIAL  LIPASE, BLOOD  URINALYSIS, ROUTINE W REFLEX MICROSCOPIC  URINE CULTURE  GC/CHLAMYDIA PROBE AMP, GENITAL  WET PREP, GENITAL   US Abdomen Complete  10/17/2011  *RADIOLOGY REPORT*  Clinical Data:  Right upper quadrant pain  COMPLETE ABDOMINAL ULTRASOUND  Comparison:  Ultrasound 08/26/2007  Findings:  Gallbladder:  There the gallbladder lumen is filled with echogenic material with dense posterior shadowing which limits evaluation of the posterior wall ("wall echo sign) ".  This is most consistent with a gallbladder filled with  small gallstones.  This finding is similar to comparison exam. Wall appears slightly thickened at 4 mm.  Difficult to assess the wall with the volume of stones. Positive sonographic Murphy's sign.  Common bile duct:  Upper limits of normal at 6 mm  Liver:  No focal lesion identified.  Within normal limits in parenchymal echogenicity.  IVC:  Appears normal.  Pancreas:  No focal abnormality seen.  Spleen:  Normal size and echogenicity.  Right Kidney:  10.0cm in length.  No evidence of hydronephrosis or stones.  Left Kidney:  10.20cm in length.  No evidence of hydronephrosis or stones.  Abdominal aorta:  No aneurysm identified.  IMPRESSION:  1.  "Wall echo sign" consistent with multiple gallstones packed within the gallbladder. This findings  is similar to prior ultrasound04/2000.  2. Gallbladder wall  thickening  coupled with positive sonographic Murphy's sign is  concerning for acute cholecystitis. 3.  Common bile duct is upper  limits of normal.  The findings conveyed to Dr. Ignacia Palma on 10/17/2007 at 1105 hours  Original Report Authenticated By: Genevive Bi, M.D.   11:29 AM Abdominal ultrasound shows gallstones, and signs of inflammation.  Call to [facility], who saw and will admit her for cholecystectomy.  1. Acute cholecystitis    12:06 PM  Date: 10/17/2011  Rate:61  Rhythm: normal sinus rhythm and premature atrial contractions (PAC)  QRS Axis: normal  Intervals: normal QRS:  Q waves in inferior lead suggests possible old inferior myocardial infarction.  ST/T Wave abnormalities: normal  Conduction Disutrbances:none  Narrative Interpretation: Abnormal EKG.  Old EKG Reviewed: none available         Carleene Cooper III, MD 10/17/11 1155  Carleene Cooper III, MD 10/17/11 [phone]

## 2011-10-17 NOTE — Preoperative (Signed)
Beta Blockers   Reason not to administer Beta Blockers:Not Applicable 

## 2011-10-17 NOTE — Op Note (Signed)
Laparoscopic Cholecystectomy Procedure Note  Indications: This patient presents with symptomatic gallbladder disease and will undergo laparoscopic cholecystectomy.  Pre-operative Diagnosis: Calculus of gallbladder without mention of cholecystitis or obstruction  Post-operative Diagnosis: Same  Surgeon: Abigail Miyamoto A   Assistants: 0  Anesthesia: General endotracheal anesthesia  ASA Class: 2  Procedure Details  The patient was seen again in the Holding Room. The risks, benefits, complications, treatment options, and expected outcomes were discussed with the patient. The possibilities of reaction to medication, pulmonary aspiration, perforation of viscus, bleeding, recurrent infection, finding a normal gallbladder, the need for additional procedures, failure to diagnose a condition, the possible need to convert to an open procedure, and creating a complication requiring transfusion or operation were discussed with the patient. The likelihood of improving the patient's symptoms with return to their baseline status is good.  The patient and/or family concurred with the proposed plan, giving informed consent. The site of surgery properly noted. The patient was taken to Operating Room, identified as Molli Hazard and the procedure verified as Laparoscopic Cholecystectomy with Intraoperative Cholangiogram. A Time Out was held and the above information confirmed.  Prior to the induction of general anesthesia, antibiotic prophylaxis was administered. General endotracheal anesthesia was then administered and tolerated well. After the induction, the abdomen was prepped with Chloraprep and draped in sterile fashion. The patient was positioned in the supine position.  Local anesthetic agent was injected into the skin near the umbilicus and an incision made. We dissected down to the abdominal fascia with blunt dissection.  The fascia was incised vertically and we entered the peritoneal cavity  bluntly.  A pursestring suture of 0-Vicryl was placed around the fascial opening.  The Hasson cannula was inserted and secured with the stay suture.  Pneumoperitoneum was then created with CO2 and tolerated well without any adverse changes in the patient's vital signs. An 11-mm port was placed in the subxiphoid position.  Two 5-mm ports were placed in the right upper quadrant. All skin incisions were infiltrated with a local anesthetic agent before making the incision and placing the trocars.   We positioned the patient in reverse Trendelenburg, tilted slightly to the patient's left.  The gallbladder was identified, the fundus grasped and retracted cephalad. Adhesions were lysed bluntly and with the electrocautery where indicated, taking care not to injure any adjacent organs or viscus. The infundibulum was grasped and retracted laterally, exposing the peritoneum overlying the triangle of Calot. This was then divided and exposed in a blunt fashion. The cystic duct was clearly identified and bluntly dissected circumferentially. A critical view of the cystic duct and cystic artery was obtained.  The cystic duct was then ligated with clips and divided. The cystic artery was, dissected free, ligated with clips and divided as well.   The gallbladder was dissected from the liver bed in retrograde fashion with the electrocautery. The gallbladder was removed and placed in an Endocatch sac. The liver bed was irrigated and inspected. Hemostasis was achieved with the electrocautery. Copious irrigation was utilized and was repeatedly aspirated until clear.  The gallbladder and Endocatch sac were then removed through the umbilical port site.  The pursestring suture was used to close the umbilical fascia.    We again inspected the right upper quadrant for hemostasis.  Pneumoperitoneum was released as we removed the trocars.  4-0 Monocryl was used to close the skin.   Benzoin, steri-strips, and clean dressings were applied.  The patient was then extubated and brought to the recovery room  in stable condition. Instrument, sponge, and needle counts were correct at closure and at the conclusion of the case.   Findings: Cholelithiasis without acute cholecystitis  Estimated Blood Loss: Minimal         Drains: 0         Specimens: Gallbladder           Complications: None; patient tolerated the procedure well.         Disposition: PACU - hemodynamically stable.         Condition: stable

## 2011-10-17 NOTE — Transfer of Care (Signed)
Immediate Anesthesia Transfer of Care Note  Patient: Sabrina Harding  Procedure(s) Performed: Procedure(s) (LRB): LAPAROSCOPIC CHOLECYSTECTOMY (N/A)  Patient Location: PACU  Anesthesia Type: General  Level of Consciousness: awake, alert  and pateint uncooperative  Airway & Oxygen Therapy: Patient Spontanous Breathing and Patient connected to nasal cannula oxygen Pt would not wear nasal cannula...  Post-op Assessment: Report given to PACU RN, Post -op Vital signs reviewed and stable and Patient moving all extremities  Post vital signs: Reviewed and stable  Complications: No apparent anesthesia complications

## 2011-10-17 NOTE — H&P (Signed)
Sabrina Harding is an 31 y.o. female.   Chief Complaint: Abdominal pain, nausea HPI: 31 yr old female with several day history of worsening RUQ pain.  She relates multiple episodes of this over the last several years but none that have been this severe or persistent.  She was diagnosed with gallstones in 2009 however at that time her symptoms were mild and intermittent.  She has had nausea and fevers in the last several days and her pain and nausea are now persistent.  Past Medical History  Diagnosis Date  . Gall stones     Past Surgical History  Procedure Date  . Tubal ligation     History reviewed. No pertinent family history. Social History:  reports that she has been smoking Cigarettes.  She has a 2.5 pack-year smoking history. She does not have any smokeless tobacco history on file. She reports that she drinks alcohol. She reports that she does not use illicit drugs.  Allergies: No Known Allergies   (Not in a hospital admission)  Results for orders placed during the hospital encounter of 10/17/11 (from the past 48 hour(s))  CBC     Status: Abnormal   Collection Time   10/17/11  8:59 AM      Component Value Range Comment   WBC 7.1  4.0 - 10.5 K/uL    RBC 4.80  3.87 - 5.11 MIL/uL    Hemoglobin 12.2  12.0 - 15.0 g/dL    HCT 96.0  45.4 - 09.8 %    MCV 76.0 (*) 78.0 - 100.0 fL    MCH 25.4 (*) 26.0 - 34.0 pg    MCHC 33.4  30.0 - 36.0 g/dL    RDW 11.9 (*) 14.7 - 15.5 %    Platelets 334  150 - 400 K/uL   DIFFERENTIAL     Status: Normal   Collection Time   10/17/11  8:59 AM      Component Value Range Comment   Neutrophils Relative 56  43 - 77 %    Neutro Abs 4.0  1.7 - 7.7 K/uL    Lymphocytes Relative 35  12 - 46 %    Lymphs Abs 2.5  0.7 - 4.0 K/uL    Monocytes Relative 6  3 - 12 %    Monocytes Absolute 0.4  0.1 - 1.0 K/uL    Eosinophils Relative 2  0 - 5 %    Eosinophils Absolute 0.2  0.0 - 0.7 K/uL    Basophils Relative 0  0 - 1 %    Basophils Absolute 0.0  0.0 - 0.1  K/uL   COMPREHENSIVE METABOLIC PANEL     Status: Abnormal   Collection Time   10/17/11  8:59 AM      Component Value Range Comment   Sodium 138  135 - 145 mEq/L    Potassium 4.4  3.5 - 5.1 mEq/L    Chloride 104  96 - 112 mEq/L    CO2 25  19 - 32 mEq/L    Glucose, Bld 80  70 - 99 mg/dL    BUN 17  6 - 23 mg/dL    Creatinine, Ser 8.29  0.50 - 1.10 mg/dL    Calcium 9.7  8.4 - 56.2 mg/dL    Total Protein 7.1  6.0 - 8.3 g/dL    Albumin 3.2 (*) 3.5 - 5.2 g/dL    AST 11  0 - 37 U/L    ALT 10  0 - 35 U/L  Alkaline Phosphatase 76  39 - 117 U/L    Total Bilirubin 0.1 (*) 0.3 - 1.2 mg/dL    GFR calc non Af Amer 67 (*) >90 mL/min    GFR calc Af Amer 78 (*) >90 mL/min   LIPASE, BLOOD     Status: Normal   Collection Time   10/17/11  9:42 AM      Component Value Range Comment   Lipase 18  11 - 59 U/L    US Abdomen Complete  10/17/2011  *RADIOLOGY REPORT*  Clinical Data:  Right upper quadrant pain  COMPLETE ABDOMINAL ULTRASOUND  Comparison:  Ultrasound 08/26/2007  Findings:  Gallbladder:  There the gallbladder lumen is filled with echogenic material with dense posterior shadowing which limits evaluation of the posterior wall ("wall echo sign) ".  This is most consistent with a gallbladder filled with  small gallstones.  This finding is similar to comparison exam. Wall appears slightly thickened at 4 mm.  Difficult to assess the wall with the volume of stones. Positive sonographic Murphy's sign.  Common bile duct:  Upper limits of normal at 6 mm  Liver:  No focal lesion identified.  Within normal limits in parenchymal echogenicity.  IVC:  Appears normal.  Pancreas:  No focal abnormality seen.  Spleen:  Normal size and echogenicity.  Right Kidney:  10.0cm in length.  No evidence of hydronephrosis or stones.  Left Kidney:  10.20cm in length.  No evidence of hydronephrosis or stones.  Abdominal aorta:  No aneurysm identified.  IMPRESSION:  1.  "Wall echo sign" consistent with multiple gallstones packed within  the gallbladder. This findings  is similar to prior ultrasound04/2000.  2. Gallbladder wall  thickening  coupled with positive sonographic Murphy's sign is  concerning for acute cholecystitis. 3.  Common bile duct is upper limits of normal.  The findings conveyed to Dr. Ignacia Palma on 10/17/2007 at 1105 hours  Original Report Authenticated By: Genevive Bi, M.D.    Review of Systems  Constitutional: Positive for fever. Negative for chills and weight loss.  HENT: Negative.   Eyes: Negative.   Respiratory: Negative.   Cardiovascular: Negative.   Gastrointestinal: Positive for nausea and abdominal pain.  Genitourinary: Negative.   Musculoskeletal: Negative.   Skin: Negative.   Neurological: Negative.   Endo/Heme/Allergies: Negative.   Psychiatric/Behavioral: Negative.     Blood pressure 123/62, pulse 67, temperature 98.2 F (36.8 C), temperature source Oral, resp. rate 20, last menstrual period 10/01/2011, SpO2 100.00%. Physical Exam  Constitutional: She is oriented to person, place, and time. She appears well-developed and well-nourished.  HENT:  Head: Normocephalic and atraumatic.  Eyes: EOM are normal. Pupils are equal, round, and reactive to light.  Neck: Normal range of motion. Neck supple.  Cardiovascular: Normal rate and regular rhythm.   Respiratory: Effort normal and breath sounds normal.  Genitourinary:       Deferred   Musculoskeletal: Normal range of motion.  Neurological: She is alert and oriented to person, place, and time.  Skin: Skin is warm and dry.  Psychiatric: She has a normal mood and affect. Her behavior is normal.     Assessment/Plan 1.  Acute cholecystitis/cholelithiasis: the patient now has persistent and severe symptoms related to her gallbladder.  There are no signs of obstruction at this time.  We will plan to take the patient to surgery today and undergo laparoscopic cholecystectomy with IOC.  RBA were discussed with the patient by Dr. Magnus Ivan and the  patient expresses understanding and wishes to  proceed.  She will be admitted to the hospital post-operatively.  Katria Botts 10/17/2011, 11:55 AM

## 2011-10-17 NOTE — H&P (Signed)
I have seen and examined the patient and agree with the assessment and plans.  Plan lap chole today.  Risks discussed in detail with patient.  These include, but are not limited to bleeding, infection, CBD injury, bile leak, injury to other structures, need to convert to an open procedure, etc.  Likelihood of success is good.  Sabrina Harding A. Magnus Ivan  MD, FACS

## 2011-10-17 NOTE — Anesthesia Postprocedure Evaluation (Signed)
  Anesthesia Post-op Note  Patient: Sabrina Harding  Procedure(s) Performed: Procedure(s) (LRB): LAPAROSCOPIC CHOLECYSTECTOMY (N/A)  Patient Location: PACU  Anesthesia Type: General  Level of Consciousness: awake  Airway and Oxygen Therapy: Patient Spontanous Breathing and Patient connected to nasal cannula oxygen  Post-op Pain: mild  Post-op Assessment: Post-op Vital signs reviewed, Patient's Cardiovascular Status Stable, Respiratory Function Stable, Patent Airway and No signs of Nausea or vomiting  Post-op Vital Signs: Reviewed and stable  Complications: No apparent anesthesia complications

## 2011-10-18 ENCOUNTER — Encounter (HOSPITAL_COMMUNITY): Payer: Self-pay | Admitting: Surgery

## 2011-10-18 MED ORDER — HYDROCODONE-ACETAMINOPHEN 5-325 MG PO TABS: 1.0000 | ORAL_TABLET | ORAL | Status: DC | PRN

## 2011-10-18 NOTE — Discharge Summary (Signed)
Physician Discharge Summary  Patient ID: Sabrina Harding MRN: [id number] DOB/AGE: [birth year] 31 y.o.  Admit date: 10/17/2011 Discharge date: 10/18/2011  Admitting Diagnosis: Acute Cholecystitis  Discharge Diagnosis S/P Laparoscopic Cholecystectomy   Consultants NONE  Procedures Laparoscopic Cholecystectomy 10/17/11   Hospital Course: 31 yr old female who presented to [facility] with several day history of abdominal pain and nausea.  Work up showed that she had cholelithiasis and gallbladder wall thickening.  She was diagnosed with gallstones in 2009 but was not having major problems until now.  She was admitted and underwent the procedure listed above.  She tolerated this well.  Post-operatively the patient did have some nausea and was slow to advance her diet therefore she stayed in the hospital an extra night.  Her abdomen was benign, vitals stable and overall pain well controlled.  Her anticipated discharge will be on 10/19/11.    Medication List  As of 10/18/2011  1:31 PM   TAKE these medications         ALEVE 220 MG tablet   Generic drug: naproxen sodium   Take 440-660 mg by mouth 4 (four) times daily as needed. For pain      HYDROcodone-acetaminophen 5-325 MG per tablet   Commonly known as: NORCO   Take 1-2 tablets by mouth every 4 (four) hours as needed.             Follow-up Information    Follow up with [facility], MD. Schedule an appointment as soon as possible for a visit in 2 weeks. (Call our office to schedule your follow up appointment in 2 weeks.)    Contact information:   [facility] [address], [address] [place] [address] [phone]          Signed: Denny Levy [facility] [phone]  10/18/2011, 1:31 PM

## 2011-10-18 NOTE — Discharge Instructions (Signed)
[facility] ______[facility] LAPAROSCOPIC SURGERY: POST OP INSTRUCTIONS Always review your discharge instruction sheet given to you by the facility where your surgery was performed. IF YOU HAVE DISABILITY OR FAMILY LEAVE FORMS, YOU MUST BRING THEM TO THE OFFICE FOR PROCESSING.   DO NOT GIVE THEM TO YOUR DOCTOR.  1. A prescription for pain medication may be given to you upon discharge.  Take your pain medication as prescribed, if needed.  If narcotic pain medicine is not needed, then you may take acetaminophen (Tylenol) or ibuprofen (Advil) as needed. 2. Take your usually prescribed medications unless otherwise directed. 3. If you need a refill on your pain medication, please contact your pharmacy.  They will contact our office to request authorization. Prescriptions will not be filled after 5pm or on week-ends. 4. You should follow a light diet the first few days after arrival home, such as soup and crackers, etc.  Be sure to include lots of fluids daily. 5. Most patients will experience some swelling and bruising in the area of the incisions.  Ice packs will help.  Swelling and bruising can take several days to resolve.  6. It is common to experience some constipation if taking pain medication after surgery.  Increasing fluid intake and taking a stool softener (such as Colace) will usually help or prevent this problem from occurring.  A mild laxative (Milk of Magnesia or Miralax) should be taken according to package instructions if there are no bowel movements after 48 hours. 7. Unless discharge instructions indicate otherwise, you may remove your bandages 24-48 hours after surgery, and you may shower at that time.  You may have steri-strips (small skin tapes) in place directly over the incision.  These strips should be left on the skin for 7-10 days.  If your surgeon used skin glue on the incision, you may shower in 24 hours.  The glue will flake off over the next 2-3 weeks.  Any sutures or  staples will be removed at the office during your follow-up visit. 8. ACTIVITIES:  You may resume regular (light) daily activities beginning the next day--such as daily self-care, walking, climbing stairs--gradually increasing activities as tolerated.  You may have sexual intercourse when it is comfortable.  Refrain from any heavy lifting or straining until approved by your doctor. a. You may drive when you are no longer taking prescription pain medication, you can comfortably wear a seatbelt, and you can safely maneuver your car and apply brakes. b. RETURN TO WORK:  ___________7-10 days_______________________________________________ 9. You should see your doctor in the office for a follow-up appointment approximately 2-3 weeks after your surgery.  Make sure that you call for this appointment within a day or two after you arrive home to insure a convenient appointment time. 10. OTHER INSTRUCTIONS: __________________________________________________________________________________________________________________________ __________________________________________________________________________________________________________________________ WHEN TO CALL YOUR DOCTOR: 1. Fever over 101.0 2. Inability to urinate 3. Continued bleeding from incision. 4. Increased pain, redness, or drainage from the incision. 5. Increasing abdominal pain  The clinic staff is available to answer your questions during regular business hours.  Please don't hesitate to call and ask to speak to one of the nurses for clinical concerns.  If you have a medical emergency, go to the nearest emergency room or call 911.  A surgeon from [facility] is always on call at the hospital. [address], [address], [place], [place]  [address] ? [address] [address], [place], [place]   [address] [phone] ? [phone] ? FAX [phone] Web  site: [email or web address]

## 2011-10-18 NOTE — Progress Notes (Signed)
I have seen and examined the patient and agree with the assessment and plans.  Krishon Adkison A. Taji Sather  MD, FACS  

## 2011-10-18 NOTE — Progress Notes (Signed)
1 Day Post-Op  Subjective: Feels nauseated this am, no report of emesis though, receiving zofran.  Objective: Vital signs in last 24 hours: Temp:  [97.4 F (36.3 C)-98.6 F (37 C)] 98.4 F (36.9 C) (07/12 1011) Pulse Rate:  [55-78] 66  (07/12 1011) Resp:  [14-34] 18  (07/12 1011) BP: (107-133)/(58-88) 109/58 mmHg (07/12 1011) SpO2:  [97 %-100 %] 100 % (07/12 1011) Weight:  [220 lb (99.791 kg)] 220 lb (99.791 kg) (07/11 1505) Last BM Date: 10/17/11  Intake/Output from previous day: 07/11 0701 - 07/12 0700 In: 2545 [P.O.:60; I.V.:2485] Out: 825 [Urine:825] Intake/Output this shift:    General appearance: alert, cooperative, appears stated age, fatigued, mild distress and moderately obese Chest: CTA chest pain c/o from last pm has resolved without reoccurance. Cardiac enzymes wnl Abdomen: tender to palpation, minimal BS, tympanic. Surgical sites appear to be healing well, scant amount of old bloody drainage on band aids. No erythema.  Lab Results:   [facility] 10/17/11 0859  WBC 7.1  HGB 12.2  HCT 36.5  PLT 334   BMET  Basename 10/17/11 0859  NA 138  K 4.4  CL 104  CO2 25  GLUCOSE 80  BUN 17  CREATININE 1.10  CALCIUM 9.7   PT/INR No results found for this basename: LABPROT:2,INR:2 in the last 72 hours ABG No results found for this basename: PHART:2,PCO2:2,PO2:2,HCO3:2 in the last 72 hours  Studies/Results: US Abdomen Complete  10/17/2011  *RADIOLOGY REPORT*  Clinical Data:  Right upper quadrant pain  COMPLETE ABDOMINAL ULTRASOUND  Comparison:  Ultrasound 08/26/2007  Findings:  Gallbladder:  There the gallbladder lumen is filled with echogenic material with dense posterior shadowing which limits evaluation of the posterior wall ("wall echo sign) ".  This is most consistent with a gallbladder filled with  small gallstones.  This finding is similar to comparison exam. Wall appears slightly thickened at 4 mm.  Difficult to assess the wall with the volume of stones.  Positive sonographic Murphy's sign.  Common bile duct:  Upper limits of normal at 6 mm  Liver:  No focal lesion identified.  Within normal limits in parenchymal echogenicity.  IVC:  Appears normal.  Pancreas:  No focal abnormality seen.  Spleen:  Normal size and echogenicity.  Right Kidney:  10.0cm in length.  No evidence of hydronephrosis or stones.  Left Kidney:  10.20cm in length.  No evidence of hydronephrosis or stones.  Abdominal aorta:  No aneurysm identified.  IMPRESSION:  1.  "Wall echo sign" consistent with multiple gallstones packed within the gallbladder. This findings  is similar to prior ultrasound04/2000.  2. Gallbladder wall  thickening  coupled with positive sonographic Murphy's sign is  concerning for acute cholecystitis. 3.  Common bile duct is upper limits of normal.  The findings conveyed to Dr. Ignacia Palma on 10/17/2007 at 1105 hours  Original Report Authenticated By: Genevive Bi, M.D.   Dg Chest Port 1 View  10/17/2011  *RADIOLOGY REPORT*  Clinical Data: Chest pain and shortness of breath.  PORTABLE CHEST - 1 VIEW  Comparison: Chest radiograph performed 10/05/2008  Findings: The lungs are mildly hypoexpanded; mild retrocardiac density is thought to reflect overlying soft tissues.  There is no evidence of pleural effusion or pneumothorax.  The cardiomediastinal silhouette is within normal limits.  No acute osseous abnormalities are seen.  IMPRESSION: Lungs mildly hypoexpanded but grossly clear.  Original Report Authenticated By: Tonia Ghent, M.D.    Anti-infectives: Anti-infectives     Start     Dose/Rate Route  Frequency Ordered Stop   10/17/11 1151   ceFAZolin (ANCEF) IVPB 2 g/50 mL premix        2 g 100 mL/hr over 30 Minutes Intravenous 60 min pre-op 10/17/11 1151 10/17/11 1304          Assessment/Plan: s/p Procedure(s) (LRB): LAPAROSCOPIC CHOLECYSTECTOMY (N/A) 1. Plan to discharge soon.  LOS: 1 day    Sabrina Harding 10/18/2011

## 2011-10-18 NOTE — Progress Notes (Signed)
Pt c/o chest pain in the mid to left chest area. States pain is an 8/10 and feels, "like it's tight and throbbing." When asked if it radiates anywhere she stated, "in my jaw a little bit." Patient had just received 4mg  of morphine IV. Pt states she's never had this type of pain before. 2L O2 applied via [place]. Rapid response paged. MD paged. Dr. Carolynne Edouard notified and cardiac enzymes + troponin ordered and chest xray to be done. Will continue to monitor.

## 2011-10-18 NOTE — Progress Notes (Deleted)
This  Is on the wrong patient

## 2011-10-19 LAB — URINE CULTURE: Colony Count: >=100000

## 2011-10-19 MED ORDER — HYDROCODONE-ACETAMINOPHEN 5-325 MG PO TABS: 1.0000 | ORAL_TABLET | ORAL | Status: DC | PRN

## 2011-10-19 NOTE — Progress Notes (Signed)
Pt was given all instructions for home care , S/S of infection /sites of sx also fu care with her RX, pt states that she understood with repeating that she needs to make her appoint as order and do fu care

## 2011-10-19 NOTE — Discharge Summary (Signed)
Physician Discharge Summary [facility]  Patient ID: Sabrina Harding MRN: [id number] DOB/AGE: [birth year] 29 y.o.  Admit date: 10/17/2011 Discharge date: 10/19/2011  Admission Diagnoses:  Symptomatic cholelithiasis  Discharge Diagnoses:  Active Problems:  * No active hospital problems. *    Discharged Condition: good  Hospital Course: patient admitted after lap chole.  Slow progress without complication.  Now tolerating diet and ambulatory.  Prepared for discharge home with family.  Consults: None  Significant Diagnostic Studies: none  Treatments: surgery: laparoscopic cholecystectomy  Discharge Exam: Blood pressure 113/62, pulse 64, temperature 98 F (36.7 C), temperature source Oral, resp. rate 16, height 5\' 5"  (1.651 m), weight 220 lb (99.791 kg), last menstrual period 10/01/2011, SpO2 100.00%. HEENT - clear Chest - clear bilat Cor - RRR Abd - soft, obese; BS present; wounds clear and dry  Disposition: Home with sister  Discharge Orders    Future Orders Please Complete By Expires   Diet - low sodium heart healthy      Increase activity slowly      Discharge instructions      Comments:   [facility] LAPAROSCOPIC SURGERY: POST OP INSTRUCTIONS  Always review your discharge instruction sheet given to you by the facility where your surgery was performed.  A prescription for pain medication may be given to you upon discharge.  Take your pain medication as prescribed.  If narcotic pain medicine is not needed, then you may take acetaminophen (Tylenol) or ibuprofen (Advil) as needed. Take your usually prescribed medications unless otherwise directed. If you need a refill on your pain medication, please contact your pharmacy.  They will contact our office to request authorization. Prescriptions will not be filled after 5 P.M. or on weekends. You should follow a light diet the first few days after arrival home, such as soup and  crackers or toast.  Be sure to include plenty of fluids daily. Most patients will experience some swelling and bruising in the area of the incisions.  Ice packs will help.  Swelling and bruising can take several days to resolve.  It is common to experience some constipation if taking pain medication after surgery.  Increasing fluid intake and taking a stool softener (such as Colace) will usually help or prevent this problem from occurring.  A mild laxative (Milk of Magnesia or Miralax) should be taken according to package instructions if there are no bowel movements after 48 hours. Unless discharge instructions indicate otherwise, you may remove your bandages 24-48 hours after surgery, and you may shower at that time.  You may have steri-strips (small skin tapes) in place directly over the incision.  These strips should be left on the skin for 7-10 days.  If your surgeon used skin glue on the incision, you may shower in 24 hours.  The glue will flake off over the next 2-3 weeks.  Any sutures or staples will be removed at the office during your follow-up visit. ACTIVITIES:  You may resume regular (light) daily activities beginning the next day-such as daily self-care, walking, climbing stairs-gradually increasing activities as tolerated.  You may have sexual intercourse when it is comfortable.  Refrain from any heavy lifting or straining until approved by your doctor. You may drive when you are no longer taking prescription pain medication, you can comfortably wear a seatbelt, and you can safely maneuver your car and apply brakes. You should see your doctor in the office for a follow-up appointment approximately 2-3 weeks after your  surgery.  Make sure that you call for this appointment within a day or two after you arrive home to insure a convenient appointment time.  WHEN TO CALL YOUR DOCTOR: Fever over 101.0 Inability to urinate Continued bleeding from incision Increased pain, redness, or drainage from  the incision Increasing abdominal pain  The clinic staff is available to answer your questions during regular business hours.  Please don't hesitate to call and ask to speak to one of the nurses for clinical concerns.  If you have a medical emergency, go to the nearest emergency room or call 911.  A surgeon from [facility] is always on call for the hospital. [phone] ? [phone] ? FAX [phone] Web site: [email or web address]   Remove dressing in 24 hours        Medication List  As of 10/19/2011  9:29 AM   TAKE these medications         ALEVE 220 MG tablet   Generic drug: naproxen sodium   Take 440-660 mg by mouth 4 (four) times daily as needed. For pain      HYDROcodone-acetaminophen 5-325 MG per tablet   Commonly known as: NORCO   Take 1-2 tablets by mouth every 4 (four) hours as needed.      HYDROcodone-acetaminophen 5-325 MG per tablet   Commonly known as: NORCO   Take 1-2 tablets by mouth every 4 (four) hours as needed for pain.           Follow-up Information    Follow up with [facility] A, MD. Schedule an appointment as soon as possible for a visit in 2 weeks. (Call our office to schedule your follow up appointment in 2 weeks.)    Contact information:   [facility] [address], [address] [place] [address] [phone]         Velora Heckler, MD, [facility] Office: [phone]    Signed: Velora Heckler 10/19/2011, 9:29 AM

## 2011-10-20 ENCOUNTER — Emergency Department (HOSPITAL_COMMUNITY)
Admission: EM | Admit: 2011-10-20 | Discharge: 2011-10-20 | Disposition: A | Discharge status: Home / Self Care | Payer: Self-pay | Attending: Emergency Medicine | Admitting: Emergency Medicine

## 2011-10-20 ENCOUNTER — Emergency Department (HOSPITAL_COMMUNITY): Payer: Self-pay

## 2011-10-20 ENCOUNTER — Encounter (HOSPITAL_COMMUNITY): Payer: Self-pay | Admitting: Family Medicine

## 2011-10-20 DIAGNOSIS — M542 Cervicalgia: Secondary | ICD-10-CM | POA: Insufficient documentation

## 2011-10-20 DIAGNOSIS — R51 Headache: Secondary | ICD-10-CM | POA: Insufficient documentation

## 2011-10-20 DIAGNOSIS — F17200 Nicotine dependence, unspecified, uncomplicated: Secondary | ICD-10-CM | POA: Insufficient documentation

## 2011-10-20 MED ORDER — DIPHENHYDRAMINE HCL 25 MG PO CAPS
25.0000 mg | ORAL_CAPSULE | Freq: Once | ORAL | Status: AC
  Administered 2011-10-20: 25 mg via ORAL
  Filled 2011-10-20: qty 1

## 2011-10-20 MED ORDER — HYDROMORPHONE HCL PF 2 MG/ML IJ SOLN
2.0000 mg | Freq: Once | INTRAMUSCULAR | Status: DC
  Filled 2011-10-20: qty 1

## 2011-10-20 MED ORDER — KETOROLAC TROMETHAMINE 60 MG/2ML IM SOLN
30.0000 mg | Freq: Once | INTRAMUSCULAR | Status: AC
  Administered 2011-10-20: 60 mg via INTRAMUSCULAR
  Filled 2011-10-20: qty 2

## 2011-10-20 MED ORDER — HYDROMORPHONE HCL PF 1 MG/ML IJ SOLN
1.0000 mg | Freq: Once | INTRAMUSCULAR | Status: AC
  Administered 2011-10-20: 1 mg via INTRAMUSCULAR

## 2011-10-20 MED ORDER — METOCLOPRAMIDE HCL 10 MG PO TABS
10.0000 mg | ORAL_TABLET | Freq: Once | ORAL | Status: AC
  Administered 2011-10-20: 10 mg via ORAL
  Filled 2011-10-20 (×2): qty 1

## 2011-10-20 NOTE — ED Notes (Signed)
Pt has had a severe HA since gallbladder surgery on Thursday. Pt also has knot on the back of her neck and swollen.

## 2011-10-20 NOTE — ED Notes (Signed)
+   Urine Chart sent to EDP office for review. 

## 2011-10-21 NOTE — ED Provider Notes (Signed)
History     CSN: [id number]  Arrival date & time 10/20/11  1406   First MD Initiated Contact with Patient 10/20/11 1426      Chief Complaint  Patient presents with  . Headache    (Consider location/radiation/quality/duration/timing/severity/associated sxs/prior treatment) Patient is a 31 y.o. female presenting with headaches. The history is provided by the patient and a parent. No language interpreter was used.  Headache  This is a recurrent problem. The current episode started more than 2 days ago. The problem occurs constantly. The headache is associated with nothing. The pain is located in the occipital region. The quality of the pain is described as throbbing. The pain is at a severity of 10/10. The pain radiates to the left neck. Pertinent negatives include no fever, no shortness of breath, no nausea and no vomiting. She has tried oral narcotic analgesics for the symptoms. The treatment provided mild relief.   31 year old patient post cholecystectomy 3 days ago with one of her typical headaches. States that the pain started in her cervical spine and radiates into her occipital lobe. States she's taken her narcotic pain medication with no relief. Denies nausea vomiting photophobia fever. Nontoxic appearance. Surgical site unremarkable. Past medical history tubal ligation tonsillectomy, headaches, anemia. Patient is a smoker. Neuro intact. Will check C-spine films. I suspect this pain is from lying on the OR table.  Past Medical History  Diagnosis Date  . Gall stones   . Heart murmur     childhood  . Headache   . Anemia     Past Surgical History  Procedure Date  . Tubal ligation   . Cholecystectomy 10/17/2011  . Tonsillectomy   . Cholecystectomy 10/17/2011    Procedure: LAPAROSCOPIC CHOLECYSTECTOMY;  Surgeon: Shelly Rubenstein, MD;  Location: [facility] OR;  Service: General;  Laterality: N/A;    History reviewed. No pertinent family history.  History  Substance Use Topics  .  Smoking status: Current Everyday Smoker -- 0.5 packs/day for 5 years    Types: Cigarettes  . Smokeless tobacco: Never Used  . Alcohol Use: Yes     occasional    OB History    Grav Para Term Preterm Abortions TAB SAB Ect Mult Living                  Review of Systems  Constitutional: Negative.  Negative for fever.  HENT: Positive for neck pain. Negative for neck stiffness.   Eyes: Negative.  Negative for photophobia, pain and visual disturbance.  Respiratory: Negative.  Negative for shortness of breath.   Cardiovascular: Negative.   Gastrointestinal: Negative.  Negative for nausea and vomiting.  Genitourinary: Negative for dysuria, urgency, frequency and vaginal discharge.  Musculoskeletal: Negative for back pain.  Neurological: Positive for headaches. Negative for weakness, light-headedness and numbness.  Psychiatric/Behavioral: Negative.   All other systems reviewed and are negative.    Allergies  Review of patient's allergies indicates no known allergies.  Home Medications   Current Outpatient Rx  Name Route Sig Dispense Refill  . HYDROCODONE-ACETAMINOPHEN 5-325 MG PO TABS Oral Take 1-2 tablets by mouth every 4 (four) hours as needed. For pain.    Marland Kitchen NAPROXEN SODIUM 220 MG PO TABS Oral Take 440-660 mg by mouth 4 (four) times daily as needed. For pain      BP 117/66  Pulse 80  Temp 97.9 F (36.6 C) (Oral)  Resp 18  SpO2 99%  LMP 10/01/2011  Physical Exam  Nursing note and vitals reviewed. Constitutional:  She is oriented to person, place, and time. She appears well-developed and well-nourished.  HENT:  Head: Normocephalic and atraumatic.  Eyes: Conjunctivae and EOM are normal. Pupils are equal, round, and reactive to light.  Neck: Normal range of motion. Neck supple.  Cardiovascular: Normal rate.   Pulmonary/Chest: Effort normal.  Abdominal: Soft.  Musculoskeletal: Normal range of motion. She exhibits no edema and no tenderness.  Neurological: She is alert and  oriented to person, place, and time. She has normal strength and normal reflexes. No cranial nerve deficit or sensory deficit. She displays a negative Romberg sign. GCS eye subscore is 4. GCS verbal subscore is 5. GCS motor subscore is 6.  Skin: Skin is warm and dry.  Psychiatric: She has a normal mood and affect.    ED Course  Procedures (including critical care time)  Labs Reviewed - No data to display Dg Cervical Spine Complete  10/20/2011  *RADIOLOGY REPORT*  Clinical Data: Neck pain.  CERVICAL SPINE - 4+ VIEWS  Comparison:  None.  Findings:  There is no evidence of cervical spine fracture or prevertebral soft tissue swelling.  Alignment is normal.  No other significant bone abnormalities are identified.  IMPRESSION: Negative cervical spine radiographs.  Original Report Authenticated By: Danae Orleans, M.D.     1. Headache, chronic daily       MDM  Typical headache with cervical point tenderness.  Cholecystectomy 3 days ago.  Some relief with migraine cocktail and dilaudid.  Will follow up with Pcp form list.  Continue norco at home for post op pain. Non-toxic.  No fever. Cervical films unremarkable.         Remi Haggard, NP 10/21/11 1029

## 2011-10-21 NOTE — ED Provider Notes (Signed)
Medical screening examination/treatment/procedure(s) were performed by non-physician practitioner and as supervising physician I was immediately available for consultation/collaboration.   Omere Marti Y. Oniyah Rohe, MD 10/21/11 1805 

## 2011-10-23 NOTE — ED Notes (Addendum)
Patient was hospitalized for cholecystectomy  and got abx treatment during her stay so I suspect she's probably been treated-but if she is symptomatic, Macrobid 100 po BID x 7 days #14 per Grant Fontana PAC.

## 2011-10-24 NOTE — ED Notes (Signed)
Attempt made to contact patient-no answer

## 2012-05-28 ENCOUNTER — Encounter (HOSPITAL_COMMUNITY): Payer: Self-pay | Admitting: *Deleted

## 2012-05-28 ENCOUNTER — Emergency Department (HOSPITAL_COMMUNITY): Payer: Self-pay

## 2012-05-28 ENCOUNTER — Emergency Department (HOSPITAL_COMMUNITY)
Admission: EM | Admit: 2012-05-28 | Discharge: 2012-05-29 | Disposition: A | Discharge status: Home / Self Care | Payer: Self-pay | Attending: Emergency Medicine | Admitting: Emergency Medicine

## 2012-05-28 DIAGNOSIS — M940 Chondrocostal junction syndrome [Tietze]: Secondary | ICD-10-CM | POA: Insufficient documentation

## 2012-05-28 DIAGNOSIS — R109 Unspecified abdominal pain: Secondary | ICD-10-CM | POA: Insufficient documentation

## 2012-05-28 DIAGNOSIS — L538 Other specified erythematous conditions: Secondary | ICD-10-CM | POA: Insufficient documentation

## 2012-05-28 DIAGNOSIS — F17200 Nicotine dependence, unspecified, uncomplicated: Secondary | ICD-10-CM | POA: Insufficient documentation

## 2012-05-28 DIAGNOSIS — Z8679 Personal history of other diseases of the circulatory system: Secondary | ICD-10-CM | POA: Insufficient documentation

## 2012-05-28 DIAGNOSIS — Z8719 Personal history of other diseases of the digestive system: Secondary | ICD-10-CM | POA: Insufficient documentation

## 2012-05-28 DIAGNOSIS — B9789 Other viral agents as the cause of diseases classified elsewhere: Secondary | ICD-10-CM | POA: Insufficient documentation

## 2012-05-28 DIAGNOSIS — J029 Acute pharyngitis, unspecified: Secondary | ICD-10-CM | POA: Insufficient documentation

## 2012-05-28 DIAGNOSIS — Z862 Personal history of diseases of the blood and blood-forming organs and certain disorders involving the immune mechanism: Secondary | ICD-10-CM | POA: Insufficient documentation

## 2012-05-28 DIAGNOSIS — J069 Acute upper respiratory infection, unspecified: Secondary | ICD-10-CM | POA: Insufficient documentation

## 2012-05-28 DIAGNOSIS — R079 Chest pain, unspecified: Secondary | ICD-10-CM | POA: Insufficient documentation

## 2012-05-28 DIAGNOSIS — Q248 Other specified congenital malformations of heart: Secondary | ICD-10-CM | POA: Insufficient documentation

## 2012-05-28 LAB — CBC WITH DIFFERENTIAL/PLATELET
Basophils Relative: 0 % (ref 0–1)
HCT: 35.8 % — ABNORMAL LOW (ref 36.0–46.0)
Hemoglobin: 12 g/dL (ref 12.0–15.0)
Lymphs Abs: 1.9 10*3/uL (ref 0.7–4.0)
MCHC: 33.5 g/dL (ref 30.0–36.0)
Monocytes Absolute: 0.6 10*3/uL (ref 0.1–1.0)
Monocytes Relative: 11 % (ref 3–12)
Neutro Abs: 2.7 10*3/uL (ref 1.7–7.7)
Neutrophils Relative %: 49 % (ref 43–77)
RBC: 4.73 MIL/uL (ref 3.87–5.11)

## 2012-05-28 LAB — POCT I-STAT, CHEM 8
BUN: 11 mg/dL (ref 6–23)
Calcium, Ion: 1.19 mmol/L (ref 1.12–1.23)
Chloride: 105 mEq/L (ref 96–112)
Creatinine, Ser: 0.9 mg/dL (ref 0.50–1.10)
Glucose, Bld: 96 mg/dL (ref 70–99)
HCT: 37 % (ref 36.0–46.0)
Potassium: 3.8 mEq/L (ref 3.5–5.1)

## 2012-05-28 NOTE — ED Notes (Addendum)
Throat has been hurting; headache. Taking tylenol and aleve without relief. Coughing a lot. Substernal cp with cough. Lower abd. Body been aching. X 2 weeks.

## 2012-05-29 MED ORDER — DM-GUAIFENESIN ER 30-600 MG PO TB12: 1.0000 | ORAL_TABLET | Freq: Two times a day (BID) | ORAL | Status: DC

## 2012-05-29 MED ORDER — DM-GUAIFENESIN ER 30-600 MG PO TB12
1.0000 | ORAL_TABLET | Freq: Once | ORAL | Status: AC
  Administered 2012-05-29: 1 via ORAL
  Filled 2012-05-29: qty 1

## 2012-05-29 MED ORDER — IBUPROFEN 800 MG PO TABS: 800.0000 mg | ORAL_TABLET | Freq: Three times a day (TID) | ORAL | Status: DC

## 2012-05-29 NOTE — ED Provider Notes (Signed)
History     CSN: [id number]  Arrival date & time 05/28/12  2207   First MD Initiated Contact with Patient 05/28/12 2357      Chief Complaint  Patient presents with  . Cough  . Abdominal Pain    (Consider location/radiation/quality/duration/timing/severity/associated sxs/prior treatment) HPI 32 year old female presents to the emergency department with complaint of 2 weeks of intermittent central chest pain worse with palpation and movement. She has had 3-4 days of nasal drainage, sore throat, cough. She denies any fevers. She has had spells of being hot and cold. She reports multiple people at work and around her have been ill. She is concerned that she may have strep throat or the flu. She's been taking Tylenol and Aleve without significant improvement in her chest symptoms. She denies any shortness of breath. She reports some mild abdominal pain with cough. He denies any nausea or vomiting. Bowel movements have been normal.  Patient reports diffuse body aches at times. Past Medical History  Diagnosis Date  . Gall stones   . Heart murmur     childhood  . Headache   . Anemia     Past Surgical History  Procedure Laterality Date  . Tubal ligation    . Cholecystectomy  10/17/2011  . Tonsillectomy    . Cholecystectomy  10/17/2011    Procedure: LAPAROSCOPIC CHOLECYSTECTOMY;  Surgeon: Shelly Rubenstein, MD;  Location: [facility] OR;  Service: General;  Laterality: N/A;    No family history on file.  History  Substance Use Topics  . Smoking status: Current Every Day Smoker -- 0.50 packs/day for 5 years    Types: Cigarettes  . Smokeless tobacco: Never Used  . Alcohol Use: Yes     Comment: occasional    OB History   Grav Para Term Preterm Abortions TAB SAB Ect Mult Living                  Review of Systems  All other systems reviewed and are negative.    Allergies  Review of patient's allergies indicates no known allergies.  Home Medications   Current Outpatient Rx  Name   Route  Sig  Dispense  Refill  . HYDROcodone-acetaminophen (NORCO) 5-325 MG per tablet   Oral   Take 1-2 tablets by mouth every 4 (four) hours as needed. For pain.         . naproxen sodium (ALEVE) 220 MG tablet   Oral   Take 440-660 mg by mouth 4 (four) times daily as needed. For pain           BP 125/71  Pulse 87  Temp(Src) 98.4 F (36.9 C) (Oral)  Resp 12  SpO2 100%  LMP 04/26/2012  Physical Exam  Nursing note and vitals reviewed. Constitutional: She is oriented to person, place, and time. She appears well-developed and well-nourished.  HENT:  Head: Normocephalic and atraumatic.  Right Ear: External ear normal.  Left Ear: External ear normal.  Rhinorrhea, mild erythema to posterior pharynx without exudate  Eyes: Conjunctivae and EOM are normal. Pupils are equal, round, and reactive to light.  Neck: Normal range of motion. Neck supple. No JVD present. No tracheal deviation present. No thyromegaly present.  Cardiovascular: Normal rate, regular rhythm, normal heart sounds and intact distal pulses.  Exam reveals no gallop and no friction rub.   No murmur heard. Pulmonary/Chest: Effort normal and breath sounds normal. No stridor. No respiratory distress. She has no wheezes. She has no rales. She exhibits no tenderness.  Abdominal: Soft. Bowel sounds are normal. She exhibits no distension and no mass. There is no tenderness. There is no rebound and no guarding.  Musculoskeletal: Normal range of motion. She exhibits no edema and no tenderness.  Lymphadenopathy:    She has no cervical adenopathy.  Neurological: She is alert and oriented to person, place, and time. No cranial nerve deficit. She exhibits normal muscle tone. Coordination normal.  Skin: Skin is warm and dry. No rash noted. No erythema. No pallor.  Psychiatric: She has a normal mood and affect. Her behavior is normal. Judgment and thought content normal.    ED Course  Procedures (including critical care  time)  Labs Reviewed  CBC WITH DIFFERENTIAL - Abnormal; Notable for the following:    HCT 35.8 (*)    MCV 75.7 (*)    MCH 25.4 (*)    Eosinophils Relative 6 (*)    All other components within normal limits  URINALYSIS, ROUTINE W REFLEX MICROSCOPIC  POCT I-STAT, CHEM 8   Dg Chest 2 View  05/28/2012  *RADIOLOGY REPORT*  Clinical Data: Chest pain, short of breath  CHEST - 2 VIEW  Comparison: Prior chest x-ray 10/17/2011  Findings: The lungs are well-aerated and free from pulmonary edema, focal airspace consolidation or pulmonary nodule.  Cardiac and mediastinal contours are within normal limits.  No pneumothorax, or pleural effusion. No acute osseous findings. surgical changes of prior cholecystectomy.  IMPRESSION:  No acute cardiopulmonary disease.   Original Report Authenticated By: Malachy Moan, M.D.      1. Viral upper respiratory illness   2. Costochondritis       MDM  31 year old female with upper respiratory infection. Patient advised to treat symptoms. She may have an element of costochondritis as well. Will have her continue NSAIDs.        Olivia Mackie, MD 05/29/12 [phone]

## 2012-07-05 ENCOUNTER — Emergency Department (HOSPITAL_COMMUNITY)
Admission: EM | Admit: 2012-07-05 | Discharge: 2012-07-06 | Disposition: A | Discharge status: Home / Self Care | Payer: Self-pay | Attending: Emergency Medicine | Admitting: Emergency Medicine

## 2012-07-05 ENCOUNTER — Encounter (HOSPITAL_COMMUNITY): Payer: Self-pay | Admitting: *Deleted

## 2012-07-05 DIAGNOSIS — Z8719 Personal history of other diseases of the digestive system: Secondary | ICD-10-CM | POA: Insufficient documentation

## 2012-07-05 DIAGNOSIS — Z862 Personal history of diseases of the blood and blood-forming organs and certain disorders involving the immune mechanism: Secondary | ICD-10-CM | POA: Insufficient documentation

## 2012-07-05 DIAGNOSIS — J020 Streptococcal pharyngitis: Secondary | ICD-10-CM | POA: Insufficient documentation

## 2012-07-05 DIAGNOSIS — R011 Cardiac murmur, unspecified: Secondary | ICD-10-CM | POA: Insufficient documentation

## 2012-07-05 DIAGNOSIS — F17200 Nicotine dependence, unspecified, uncomplicated: Secondary | ICD-10-CM | POA: Insufficient documentation

## 2012-07-05 DIAGNOSIS — R42 Dizziness and giddiness: Secondary | ICD-10-CM | POA: Insufficient documentation

## 2012-07-05 MED ORDER — OXYCODONE-ACETAMINOPHEN 5-325 MG PO TABS
1.0000 | ORAL_TABLET | Freq: Once | ORAL | Status: AC
  Administered 2012-07-05: 1 via ORAL
  Filled 2012-07-05: qty 1

## 2012-07-05 MED ORDER — IBUPROFEN 200 MG PO TABS
400.0000 mg | ORAL_TABLET | Freq: Once | ORAL | Status: AC
  Administered 2012-07-05: 400 mg via ORAL
  Filled 2012-07-05: qty 2

## 2012-07-05 MED ORDER — HYDROCODONE-ACETAMINOPHEN 5-325 MG PO TABS: 1.0000 | ORAL_TABLET | Freq: Four times a day (QID) | ORAL | Status: DC | PRN

## 2012-07-05 MED ORDER — ONDANSETRON 4 MG PO TBDP
8.0000 mg | ORAL_TABLET | Freq: Once | ORAL | Status: AC
  Administered 2012-07-05: 8 mg via ORAL
  Filled 2012-07-05: qty 2

## 2012-07-05 MED ORDER — HYDROCODONE-ACETAMINOPHEN 5-325 MG PO TABS
1.0000 | ORAL_TABLET | Freq: Once | ORAL | Status: AC
  Administered 2012-07-05: 1 via ORAL
  Filled 2012-07-05: qty 1

## 2012-07-05 MED ORDER — PENICILLIN G BENZATHINE 1200000 UNIT/2ML IM SUSP
1.2000 10*6.[IU] | Freq: Once | INTRAMUSCULAR | Status: AC
  Administered 2012-07-05: 1.2 10*6.[IU] via INTRAMUSCULAR
  Filled 2012-07-05: qty 2

## 2012-07-05 NOTE — ED Notes (Signed)
C/o sore throat, onset 2d ago, also reports dizziness, Ha and bilateral earache. Took 1 - 200mg  ibuprofen at 1700. Low grade fever noted in triage. Throat red irritated and swollen. Difficult to visualize posterior wall of orpharynx. Exudate possible. Rapid strep swab sent from triage.

## 2012-07-05 NOTE — ED Provider Notes (Signed)
History     CSN: [id number]  Arrival date & time 07/05/12  2134   First MD Initiated Contact with Patient 07/05/12 2258      Chief Complaint  Patient presents with  . Sore Throat  . Dizziness    HPI Patient presents to emergency room with complaints of sore throat that started 2 days ago. Patient also has a headache and bilateral ear pain. She has felt chilled but did not take a temperature at home. Today her symptoms have progressed. It is painful for her to swallow. She denies any nasal congestion. No cough no vomiting or diarrhea. Does have a headache associated with her symptoms Past Medical History  Diagnosis Date  . Gall stones   . Heart murmur     childhood  . Headache   . Anemia     Past Surgical History  Procedure Laterality Date  . Tubal ligation    . Cholecystectomy  10/17/2011  . Tonsillectomy    . Cholecystectomy  10/17/2011    Procedure: LAPAROSCOPIC CHOLECYSTECTOMY;  Surgeon: Shelly Rubenstein, MD;  Location: [facility] OR;  Service: General;  Laterality: N/A;    History reviewed. No pertinent family history.  History  Substance Use Topics  . Smoking status: Current Every Day Smoker -- 0.50 packs/day for 5 years    Types: Cigarettes  . Smokeless tobacco: Never Used  . Alcohol Use: Yes     Comment: occasional    OB History   Grav Para Term Preterm Abortions TAB SAB Ect Mult Living                  Review of Systems  All other systems reviewed and are negative.    Allergies  Review of patient's allergies indicates no known allergies.  Home Medications   Current Outpatient Rx  Name  Route  Sig  Dispense  Refill  . dextromethorphan-guaiFENesin (MUCINEX DM) 30-600 MG per 12 hr tablet   Oral   Take 1 tablet by mouth every 12 (twelve) hours.   30 tablet   0   . HYDROcodone-acetaminophen (NORCO) 5-325 MG per tablet   Oral   Take 1-2 tablets by mouth every 6 (six) hours as needed for pain.   16 tablet   0   . ibuprofen (ADVIL,MOTRIN) 800 MG  tablet   Oral   Take 1 tablet (800 mg total) by mouth 3 (three) times daily.   21 tablet   0   . naproxen sodium (ALEVE) 220 MG tablet   Oral   Take 440-660 mg by mouth 4 (four) times daily as needed. For pain           BP 126/82  Pulse 99  Temp(Src) 99.5 F (37.5 C) (Oral)  Resp 20  SpO2 100%  LMP 06/30/2012  Physical Exam  Nursing note and vitals reviewed. Constitutional: No distress.  Uncomfortable appearing  HENT:  Head: Normocephalic and atraumatic.  Right Ear: Tympanic membrane, external ear and ear canal normal.  Left Ear: External ear and ear canal normal.  Mouth/Throat: No edematous. Oropharyngeal exudate, posterior oropharyngeal edema and posterior oropharyngeal erythema present. No tonsillar abscesses.  Normal tympanic membranes bilaterally  Eyes: Conjunctivae are normal. Right eye exhibits no discharge. Left eye exhibits no discharge. No scleral icterus.  Neck: Neck supple. No tracheal deviation present.  Cardiovascular: Normal rate, regular rhythm and intact distal pulses.   Pulmonary/Chest: Effort normal and breath sounds normal. No stridor. No respiratory distress. She has no wheezes. She has no  rales.  Abdominal: Soft. Bowel sounds are normal. She exhibits no distension. There is no tenderness. There is no rebound and no guarding.  Musculoskeletal: She exhibits no edema and no tenderness.  Neurological: She is alert. She has normal strength. No sensory deficit. Cranial nerve deficit:  no gross defecits noted. She exhibits normal muscle tone. She displays no seizure activity. Coordination normal.  Skin: Skin is warm and dry. No rash noted.  Psychiatric: She has a normal mood and affect.    ED Course  Procedures (including critical care time)  Labs Reviewed  RAPID STREP SCREEN - Abnormal; Notable for the following:    Streptococcus, Group A Screen (Direct) POSITIVE (*)    All other components within normal limits   No results found.   1. Strep  throat       MDM  Patient will be treated with an injection of penicillin for her streptococcal pharyngitis. Will give her prescription for hydrocodone for her pain and discomfort        Celene Kras, MD 07/05/12 [phone]

## 2012-07-05 NOTE — ED Notes (Signed)
meds crushed in apple sauce 

## 2012-08-18 ENCOUNTER — Emergency Department (HOSPITAL_COMMUNITY): Payer: Self-pay

## 2012-08-18 ENCOUNTER — Encounter (HOSPITAL_COMMUNITY): Payer: Self-pay | Admitting: Emergency Medicine

## 2012-08-18 ENCOUNTER — Emergency Department (HOSPITAL_COMMUNITY)
Admission: EM | Admit: 2012-08-18 | Discharge: 2012-08-18 | Disposition: A | Discharge status: Home / Self Care | Payer: Self-pay | Attending: Emergency Medicine | Admitting: Emergency Medicine

## 2012-08-18 DIAGNOSIS — R112 Nausea with vomiting, unspecified: Secondary | ICD-10-CM | POA: Insufficient documentation

## 2012-08-18 DIAGNOSIS — R51 Headache: Secondary | ICD-10-CM | POA: Insufficient documentation

## 2012-08-18 DIAGNOSIS — R5383 Other fatigue: Secondary | ICD-10-CM | POA: Insufficient documentation

## 2012-08-18 DIAGNOSIS — D649 Anemia, unspecified: Secondary | ICD-10-CM | POA: Insufficient documentation

## 2012-08-18 DIAGNOSIS — R0789 Other chest pain: Secondary | ICD-10-CM | POA: Insufficient documentation

## 2012-08-18 DIAGNOSIS — R42 Dizziness and giddiness: Secondary | ICD-10-CM | POA: Insufficient documentation

## 2012-08-18 DIAGNOSIS — A5900 Urogenital trichomoniasis, unspecified: Secondary | ICD-10-CM | POA: Insufficient documentation

## 2012-08-18 DIAGNOSIS — F17200 Nicotine dependence, unspecified, uncomplicated: Secondary | ICD-10-CM | POA: Insufficient documentation

## 2012-08-18 DIAGNOSIS — A599 Trichomoniasis, unspecified: Secondary | ICD-10-CM

## 2012-08-18 DIAGNOSIS — R5381 Other malaise: Secondary | ICD-10-CM | POA: Insufficient documentation

## 2012-08-18 DIAGNOSIS — R011 Cardiac murmur, unspecified: Secondary | ICD-10-CM | POA: Insufficient documentation

## 2012-08-18 LAB — URINALYSIS W MICROSCOPIC + REFLEX CULTURE
Glucose, UA: NEGATIVE mg/dL
Ketones, ur: NEGATIVE mg/dL
Protein, ur: NEGATIVE mg/dL
Urobilinogen, UA: 1 mg/dL (ref 0.0–1.0)

## 2012-08-18 LAB — CBC
Hemoglobin: 12.4 g/dL (ref 12.0–15.0)
MCH: 25.4 pg — ABNORMAL LOW (ref 26.0–34.0)
RBC: 4.89 MIL/uL (ref 3.87–5.11)

## 2012-08-18 LAB — COMPREHENSIVE METABOLIC PANEL
ALT: 12 U/L (ref 0–35)
Alkaline Phosphatase: 82 U/L (ref 39–117)
CO2: 26 mEq/L (ref 19–32)
GFR calc Af Amer: 85 mL/min — ABNORMAL LOW (ref >90)
GFR calc non Af Amer: 73 mL/min — ABNORMAL LOW (ref >90)
Glucose, Bld: 88 mg/dL (ref 70–99)
Potassium: 3.9 mEq/L (ref 3.5–5.1)
Sodium: 136 mEq/L (ref 135–145)
Total Bilirubin: 0.3 mg/dL (ref 0.3–1.2)

## 2012-08-18 LAB — POCT I-STAT TROPONIN I: Troponin i, poc: 0 ng/mL (ref 0.00–0.08)

## 2012-08-18 MED ORDER — MORPHINE SULFATE 4 MG/ML IJ SOLN
2.0000 mg | Freq: Once | INTRAMUSCULAR | Status: AC
  Administered 2012-08-18: 2 mg via INTRAVENOUS
  Filled 2012-08-18: qty 1

## 2012-08-18 MED ORDER — SODIUM CHLORIDE 0.9 % IV BOLUS (SEPSIS)
500.0000 mL | Freq: Once | INTRAVENOUS | Status: AC
  Administered 2012-08-18: 500 mL via INTRAVENOUS

## 2012-08-18 MED ORDER — METRONIDAZOLE 500 MG PO TABS
2000.0000 mg | ORAL_TABLET | Freq: Once | ORAL | Status: AC
  Administered 2012-08-18: 2000 mg via ORAL
  Filled 2012-08-18: qty 4

## 2012-08-18 MED ORDER — MORPHINE SULFATE 4 MG/ML IJ SOLN: 2.0000 mg | Freq: Once | INTRAMUSCULAR | Status: DC

## 2012-08-18 MED ORDER — METOCLOPRAMIDE HCL 5 MG/ML IJ SOLN
10.0000 mg | Freq: Once | INTRAMUSCULAR | Status: AC
  Administered 2012-08-18: 10 mg via INTRAVENOUS
  Filled 2012-08-18: qty 2

## 2012-08-18 MED ORDER — DIPHENHYDRAMINE HCL 50 MG/ML IJ SOLN
25.0000 mg | Freq: Once | INTRAMUSCULAR | Status: AC
  Administered 2012-08-18: 25 mg via INTRAVENOUS
  Filled 2012-08-18: qty 1

## 2012-08-18 MED ORDER — SODIUM CHLORIDE 0.9 % IV SOLN
Freq: Once | INTRAVENOUS | Status: AC
  Administered 2012-08-18: 13:00:00 via INTRAVENOUS

## 2012-08-18 NOTE — ED Notes (Signed)
Pt alert and mentating appropriately upon d/c. Pt given d/c teaching and follow up care instructions. Pt verbalizes understanding and has no further questions upon d/c. NAD noted upon d/c. Pt instructed not to drive home and was brought here by EMS. PT endorses she will not be driving home and will be getting her sister to pick her from ED.

## 2012-08-18 NOTE — ED Notes (Signed)
Jen, PA at bedside.

## 2012-08-18 NOTE — ED Notes (Signed)
Sabrina Harding, [place] verifies she wants the morphine to be given now

## 2012-08-18 NOTE — ED Provider Notes (Signed)
History     CSN: [id number]  Arrival date & time 08/18/12  1119   First MD Initiated Contact with Patient 08/18/12 1124      Chief Complaint  Patient presents with  . Near Syncope    (Consider location/radiation/quality/duration/timing/severity/associated sxs/prior treatment) HPI  Patient is a 32 year old female past medical history significant for headaches presenting to the emergency department EMS for lightheadedness that began at work this morning. Patient states she woke up not feeling well which included headache with typical symptoms for her headaches, nausea. Patient also states she had central nonradiating chest pressure. She states she has had pains like this in the past with negative workups. She took one aspirin for the headache. Patient states she went to work at a [employer] progressively felt worse and became lightheaded and had 2 episodes of nonbloody nonbilious emesis. States she became so lightheaded she slipped on the wall to the ground and she bumped the left side of her head. She remembers the coworker giving her a course facecloth and then the paramedics arriving. Patient denies thunderclap headache. Denies shortness of breath or diaphoresis.  Past Medical History  Diagnosis Date  . Gall stones   . Heart murmur     childhood  . Headache   . Anemia     Past Surgical History  Procedure Laterality Date  . Tubal ligation    . Cholecystectomy  10/17/2011  . Tonsillectomy    . Cholecystectomy  10/17/2011    Procedure: LAPAROSCOPIC CHOLECYSTECTOMY;  Surgeon: Shelly Rubenstein, MD;  Location: [facility] OR;  Service: General;  Laterality: N/A;    History reviewed. No pertinent family history.  History  Substance Use Topics  . Smoking status: Current Every Day Smoker -- 0.50 packs/day for 5 years    Types: Cigarettes  . Smokeless tobacco: Never Used  . Alcohol Use: Yes     Comment: occasional    OB History   Grav Para Term Preterm Abortions TAB SAB Ect  Mult Living                  Review of Systems  Constitutional: Positive for fatigue. Negative for fever, chills and diaphoresis.  HENT: Negative for facial swelling, neck pain and neck stiffness.   Eyes: Negative for pain and visual disturbance.  Respiratory: Negative for cough and shortness of breath.   Cardiovascular: Positive for chest pain. Negative for palpitations and leg swelling.  Gastrointestinal: Positive for nausea and vomiting. Negative for abdominal pain, diarrhea, constipation and blood in stool.  Genitourinary: Negative for dysuria, vaginal bleeding, vaginal discharge, difficulty urinating and vaginal pain.  Musculoskeletal: Negative for back pain.  Skin: Negative.   Neurological: Positive for light-headedness and headaches. Negative for tremors, seizures, facial asymmetry, speech difficulty and numbness.    Allergies  Review of patient's allergies indicates no known allergies.  Home Medications   No current outpatient prescriptions on file.  BP 116/68  Pulse 58  Temp(Src) 98.3 F (36.8 C) (Oral)  Resp 13  Ht 5\' 6"  (1.676 m)  SpO2 100%  LMP 07/11/2012  Physical Exam  Constitutional: She is oriented to person, place, and time. She appears well-developed and well-nourished. No distress.  HENT:  Head: Normocephalic and atraumatic.  Mouth/Throat: Oropharynx is clear and moist. No oropharyngeal exudate.  Eyes: EOM are normal. Pupils are equal, round, and reactive to light.  Neck: Normal range of motion. Neck supple.  Cardiovascular: Normal rate, regular rhythm, normal heart sounds and intact distal pulses.  Pulmonary/Chest: Effort normal and breath sounds normal. No respiratory distress. She has no wheezes. She has no rales. She exhibits no tenderness.  Abdominal: Soft. Bowel sounds are normal. There is no tenderness.  Neurological: She is alert and oriented to person, place, and time. She has normal strength. She displays no atrophy and no tremor. No cranial  nerve deficit or sensory deficit. She exhibits normal muscle tone. She displays a negative Romberg sign. She displays no seizure activity. Coordination and gait normal. GCS eye subscore is 4. GCS verbal subscore is 5. GCS motor subscore is 6.  Skin: Skin is warm and dry. No rash noted. She is not diaphoretic.  Psychiatric: She has a normal mood and affect.    ED Course  Procedures (including critical care time)   Date: 08/18/2012  Rate: 67  Rhythm: sinus rhythm  QRS Axis: normal  Intervals: normal  ST/T Wave abnormalities: normal  Conduction Disutrbances:none  Narrative Interpretation:   Old EKG Reviewed: unchanged    Labs Reviewed  URINALYSIS W MICROSCOPIC + REFLEX CULTURE - Abnormal; Notable for the following:    APPearance CLOUDY (*)    Hgb urine dipstick TRACE (*)    Nitrite POSITIVE (*)    Leukocytes, UA SMALL (*)    Bacteria, UA MANY (*)    All other components within normal limits  CBC - Abnormal; Notable for the following:    MCV 75.9 (*)    MCH 25.4 (*)    All other components within normal limits  COMPREHENSIVE METABOLIC PANEL - Abnormal; Notable for the following:    Albumin 2.9 (*)    GFR calc non Af Amer 73 (*)    GFR calc Af Amer 85 (*)    All other components within normal limits  URINE CULTURE  LIPASE, BLOOD  POCT I-STAT TROPONIN I   Dg Chest 2 View  08/18/2012  *RADIOLOGY REPORT*  Clinical Data: Dizziness, weakness, shortness of breath, headache  CHEST - 2 VIEW  Comparison: 05/28/2012; 10/17/2011; 10/05/2008; chest CT - 11/22/2005.  Findings:  Grossly unchanged cardiac silhouette and mediastinal contours.  No focal airspace opacities.  No pleural effusion or pneumothorax.  No evidence of edema.  Unchanged bones.  Post cholecystectomy.  IMPRESSION: No acute cardiopulmonary disease.   Original Report Authenticated By: Tacey Ruiz, MD    Ct Head Wo Contrast  08/18/2012  *RADIOLOGY REPORT*  Clinical Data: Headache, nausea, vomiting, near-syncopal episode  CT  HEAD WITHOUT CONTRAST  Technique:  Contiguous axial images were obtained from the base of the skull through the vertex without contrast.  Comparison: None.  Findings:  Wallace Cullens white differentiation is maintained.  No CT evidence of acute large territory infarct.  No intraparenchymal or extra-axial mass or hemorrhage.  Normal size and configuration of the ventricles and basilar cisterns.  No midline shift.  Limited visualization of the paranasal sinuses and mastoid air cells are normal.  Regional soft tissues are normal.  No displaced calvarial fracture.  IMPRESSION: Negative noncontrast head CT.   Original Report Authenticated By: Tacey Ruiz, MD      1. Headache   2. Trichomonas       MDM  1) Headache: Pt HA treated and improved while in ED.  Presentation is like pts typical HA and non concerning for [facility], ICH, Meningitis, or temporal arteritis. CT r/o mass. Pt is afebrile with no focal neuro deficits, nuchal rigidity, or change in vision. Pt able to ambulate. Pt is to follow up with PCP to discuss prophylactic  medication. Patient is to be discharged with recommendation to follow up with PCP in regards to today's hospital visit.   2) Chest pain: Chest pain is not likely of cardiac or pulmonary etiology d/t presentation, perc negative, VSS, no tracheal deviation, no JVD or new murmur, RRR, breath sounds equal bilaterally, EKG without acute abnormalities, negative troponin, and negative CXR. Pt has been advised to return to the ED is CP becomes exertional, associated with diaphoresis or nausea, radiates to left jaw/arm, worsens or becomes concerning in any way. Pt appears reliable for follow up and is agreeable to discharge.   Pt treated for trichomonas in ED. Pt verbalizes understanding and is agreeable with plan to dc. Case has been discussed with and seen by Dr. Fredderick Phenix who agrees with the above plan to discharge.              Jeannetta Ellis, PA-C 08/19/12 1122

## 2012-08-18 NOTE — ED Notes (Signed)
Pt reports to the ED via [facility] for eval of near syncopal episode while she was at work. Pt reports she awoke this am with a HA and N/V. Pt went to work and began having chest tightness. Pt attempted to calm herself by taking deep breaths and reports she passed out. Pt extremely anxious upon arrival. Pt calm and cooperative at this time. Pt reports hitting her left forehead. No swelling noted. Pt A&O x4. No neuro deficits noted. Pt reports pain that begins in the epigastric region and radiates into her chest. Pt denies SOB and diaphoresis. Pt reports dizziness and N/V. 12 lead unremarkable per EMS.

## 2012-08-18 NOTE — ED Notes (Signed)
Pt taken to CT and xray

## 2012-08-19 NOTE — ED Provider Notes (Signed)
Medical screening examination/treatment/procedure(s) were performed by non-physician practitioner and as supervising physician I was immediately available for consultation/collaboration.   Rolan Bucco, MD 08/19/12 [phone]

## 2012-08-22 LAB — URINE CULTURE

## 2012-08-23 ENCOUNTER — Telehealth (HOSPITAL_COMMUNITY): Payer: Self-pay | Admitting: Emergency Medicine

## 2012-08-23 NOTE — Progress Notes (Signed)
ED Antimicrobial Stewardship Positive Culture Follow Up   Sabrina Harding is an 32 y.o. female who presented to [facility] on 08/18/2012 with a chief complaint of  Chief Complaint  Patient presents with  . Near Syncope    Recent Results (from the past 720 hour(s))  URINE CULTURE     Status: None   Collection Time    08/18/12  1:56 PM      Result Value Range Status   Specimen Description URINE, RANDOM   Final   Special Requests NONE   Final   Culture  Setup Time 08/18/2012 15:30   Final   Colony Count >=100,000 COLONIES/ML   Final   Culture     Final   Value: ESCHERICHIA COLI     Note: Two isolates with different morphologies were identified as the same organism.The most resistant organism was reported.   Report Status 08/22/2012 FINAL   Final   Organism ID, Bacteria ESCHERICHIA COLI   Final     [x]  Patient discharged originally without antimicrobial agent and treatment is now indicated  New antibiotic prescription: perform symptom check, find out if patient follow-up with PCP as directed by EDP, if symptoms of dysuria use cephalexin 500mg  PO four times daily x 7 days  ED Provider: Tyler Deis, PA-C   Laurence Slate 08/23/2012, 12:58 PM Infectious Diseases Pharmacist Phone# [phone]

## 2012-08-23 NOTE — ED Notes (Signed)
Post ED Visit - Positive Culture Follow-up: Successful Patient Follow-Up  Culture assessed and recommendations reviewed by: []  Wes Dulaney, Pharm.D., BCPS []  Celedonio Miyamoto, Pharm.D., BCPS []  Georgina Pillion, Pharm.D., BCPS []  [place], [address].D., BCPS, AAHIVP []  Estella Husk, Pharm.D., BCPS, AAHIVP [x]  Laurence Slate, Pharm.D.  Positive urine culture  [x]  Patient discharged without antimicrobial prescription and treatment is now indicated []  Organism is resistant to prescribed ED discharge antimicrobial []  Patient with positive blood cultures  Changes discussed with ED provider: Francee Piccolo PA-C New antibiotic prescription: Keflex 500 mg QID x 7 days    Sabrina Harding 08/23/2012, 2:37 PM

## 2012-09-28 ENCOUNTER — Encounter (HOSPITAL_COMMUNITY): Payer: Self-pay | Admitting: *Deleted

## 2012-09-28 ENCOUNTER — Emergency Department (HOSPITAL_COMMUNITY)
Admission: EM | Admit: 2012-09-28 | Discharge: 2012-09-28 | Disposition: A | Discharge status: Home / Self Care | Payer: Self-pay | Attending: Emergency Medicine | Admitting: Emergency Medicine

## 2012-09-28 DIAGNOSIS — Z862 Personal history of diseases of the blood and blood-forming organs and certain disorders involving the immune mechanism: Secondary | ICD-10-CM | POA: Insufficient documentation

## 2012-09-28 DIAGNOSIS — Z79899 Other long term (current) drug therapy: Secondary | ICD-10-CM | POA: Insufficient documentation

## 2012-09-28 DIAGNOSIS — R52 Pain, unspecified: Secondary | ICD-10-CM | POA: Insufficient documentation

## 2012-09-28 DIAGNOSIS — R011 Cardiac murmur, unspecified: Secondary | ICD-10-CM | POA: Insufficient documentation

## 2012-09-28 DIAGNOSIS — G43909 Migraine, unspecified, not intractable, without status migrainosus: Secondary | ICD-10-CM | POA: Insufficient documentation

## 2012-09-28 DIAGNOSIS — R1110 Vomiting, unspecified: Secondary | ICD-10-CM | POA: Insufficient documentation

## 2012-09-28 DIAGNOSIS — Z8719 Personal history of other diseases of the digestive system: Secondary | ICD-10-CM | POA: Insufficient documentation

## 2012-09-28 DIAGNOSIS — J029 Acute pharyngitis, unspecified: Secondary | ICD-10-CM | POA: Insufficient documentation

## 2012-09-28 DIAGNOSIS — Z8669 Personal history of other diseases of the nervous system and sense organs: Secondary | ICD-10-CM

## 2012-09-28 DIAGNOSIS — F17200 Nicotine dependence, unspecified, uncomplicated: Secondary | ICD-10-CM | POA: Insufficient documentation

## 2012-09-28 MED ORDER — HYDROMORPHONE HCL PF 1 MG/ML IJ SOLN
1.0000 mg | Freq: Once | INTRAMUSCULAR | Status: AC
  Administered 2012-09-28: 1 mg via INTRAMUSCULAR
  Filled 2012-09-28: qty 1

## 2012-09-28 MED ORDER — DIPHENHYDRAMINE HCL 25 MG PO CAPS
25.0000 mg | ORAL_CAPSULE | Freq: Once | ORAL | Status: AC
  Administered 2012-09-28: 25 mg via ORAL
  Filled 2012-09-28: qty 1

## 2012-09-28 MED ORDER — METOCLOPRAMIDE HCL 10 MG PO TABS: 10.0000 mg | ORAL_TABLET | Freq: Once | ORAL | Status: DC

## 2012-09-28 MED ORDER — TOPIRAMATE 25 MG PO TABS: 25.0000 mg | ORAL_TABLET | Freq: Every day | ORAL | Status: DC

## 2012-09-28 MED ORDER — KETOROLAC TROMETHAMINE 30 MG/ML IJ SOLN
30.0000 mg | Freq: Once | INTRAMUSCULAR | Status: AC
  Administered 2012-09-28: 30 mg via INTRAMUSCULAR
  Filled 2012-09-28: qty 1

## 2012-09-28 NOTE — ED Provider Notes (Signed)
History    CSN: [id number] Arrival date & time 09/28/12  4098  First MD Initiated Contact with Patient 09/28/12 2120     Chief Complaint  Patient presents with  . Headache  . Emesis   HPI Pt states that she has had a headache since Saturday; pt states that she has a history of migraines and that this is a typical migraine headache; pt states that she also has vomiting and a sore throat; pt states that she has vomited x 2 today; denies diarrhea; pt also c/o generalized body aches.    Past Medical History  Diagnosis Date  . Gall stones   . Heart murmur     childhood  . Headache(784.0)   . Anemia    Past Surgical History  Procedure Laterality Date  . Tubal ligation    . Cholecystectomy  10/17/2011  . Tonsillectomy    . Cholecystectomy  10/17/2011    Procedure: LAPAROSCOPIC CHOLECYSTECTOMY;  Surgeon: Shelly Rubenstein, MD;  Location: [facility] OR;  Service: General;  Laterality: N/A;   No family history on file. History  Substance Use Topics  . Smoking status: Current Every Day Smoker -- 0.50 packs/day for 5 years    Types: Cigarettes  . Smokeless tobacco: Never Used  . Alcohol Use: Yes     Comment: occasional   OB History   Grav Para Term Preterm Abortions TAB SAB Ect Mult Living                 Review of Systems  All other systems reviewed and are negative.   All other systems reviewed and are negative  Allergies  Review of patient's allergies indicates no known allergies.  Home Medications   Current Outpatient Rx  Name  Route  Sig  Dispense  Refill  . Aspirin-Salicylamide-Caffeine (BC HEADACHE POWDER PO)   Oral   Take 1 packet by mouth 2 (two) times daily as needed (for pain).         Marland Kitchen ibuprofen (ADVIL,MOTRIN) 200 MG tablet   Oral   Take 400 mg by mouth every 8 (eight) hours as needed for pain.         . naproxen sodium (ANAPROX) 220 MG tablet   Oral   Take 440 mg by mouth 2 (two) times daily as needed (for pain).         . topiramate (TOPAMAX) 25  MG tablet   Oral   Take 1 tablet (25 mg total) by mouth daily.   30 tablet   0     Take every evening    BP 115/68  Pulse 71  Temp(Src) 98.1 F (36.7 C) (Oral)  Resp 18  Ht 5\' 5"  (1.651 m)  Wt 245 lb (111.131 kg)  BMI 40.77 kg/m2  SpO2 100%  LMP 09/01/2012 Physical Exam  Nursing note and vitals reviewed. Constitutional: She is oriented to person, place, and time. She appears well-developed and well-nourished. No distress.  HENT:  Head: Normocephalic and atraumatic.  Eyes: Pupils are equal, round, and reactive to light.  Neck: Normal range of motion.  Cardiovascular: Normal rate and intact distal pulses.   Pulmonary/Chest: No respiratory distress.  Abdominal: Normal appearance. She exhibits no distension.  Musculoskeletal: Normal range of motion.  Neurological: She is alert and oriented to person, place, and time. No cranial nerve deficit.  Skin: Skin is warm and dry. No rash noted.  Psychiatric: She has a normal mood and affect. Her behavior is normal.    ED  Course  Procedures (including critical care time) Medications  diphenhydrAMINE (BENADRYL) capsule 25 mg (not administered)  metoCLOPramide (REGLAN) tablet 10 mg (not administered)  HYDROmorphone (DILAUDID) injection 1 mg (not administered)  ketorolac (TORADOL) 30 MG/ML injection 30 mg (not administered)      Labs Reviewed - No data to display No results found. 1. History of migraine headaches     MDM    Nelia Shi, MD 10/08/12 2020

## 2012-09-28 NOTE — ED Notes (Signed)
Pt states that she has had a headache since Saturday; pt states that she has a history of migraines and that this is a typical migraine headache; pt states that she also has vomiting and a sore throat; pt states that she has vomited x 2 today; denies diarrhea; pt also c/o generalized body aches.

## 2012-11-19 ENCOUNTER — Emergency Department (HOSPITAL_COMMUNITY)
Admission: EM | Admit: 2012-11-19 | Discharge: 2012-11-19 | Disposition: A | Discharge status: Home / Self Care | Payer: Self-pay | Attending: Emergency Medicine | Admitting: Emergency Medicine

## 2012-11-19 ENCOUNTER — Encounter (HOSPITAL_COMMUNITY): Payer: Self-pay | Admitting: *Deleted

## 2012-11-19 DIAGNOSIS — L03011 Cellulitis of right finger: Secondary | ICD-10-CM

## 2012-11-19 DIAGNOSIS — F17200 Nicotine dependence, unspecified, uncomplicated: Secondary | ICD-10-CM | POA: Insufficient documentation

## 2012-11-19 DIAGNOSIS — L03019 Cellulitis of unspecified finger: Secondary | ICD-10-CM | POA: Insufficient documentation

## 2012-11-19 DIAGNOSIS — D649 Anemia, unspecified: Secondary | ICD-10-CM | POA: Insufficient documentation

## 2012-11-19 DIAGNOSIS — Z8669 Personal history of other diseases of the nervous system and sense organs: Secondary | ICD-10-CM | POA: Insufficient documentation

## 2012-11-19 DIAGNOSIS — Z8719 Personal history of other diseases of the digestive system: Secondary | ICD-10-CM | POA: Insufficient documentation

## 2012-11-19 DIAGNOSIS — R011 Cardiac murmur, unspecified: Secondary | ICD-10-CM | POA: Insufficient documentation

## 2012-11-19 DIAGNOSIS — M79609 Pain in unspecified limb: Secondary | ICD-10-CM | POA: Insufficient documentation

## 2012-11-19 MED ORDER — TRAMADOL HCL 50 MG PO TABS
50.0000 mg | ORAL_TABLET | Freq: Once | ORAL | Status: AC
  Administered 2012-11-19: 50 mg via ORAL
  Filled 2012-11-19: qty 1

## 2012-11-19 MED ORDER — CEPHALEXIN 500 MG PO CAPS: 500.0000 mg | ORAL_CAPSULE | Freq: Four times a day (QID) | ORAL | Status: DC

## 2012-11-19 NOTE — ED Notes (Signed)
To ED for eval of right ring finger swelling, pain, and drainage for the past week. No known injury. Pt works with food and washes dishes at work.

## 2012-11-19 NOTE — ED Provider Notes (Signed)
Medical screening examination/treatment/procedure(s) were performed by non-physician practitioner and as supervising physician I was immediately available for consultation/collaboration.  Darlys Gales, MD 11/19/12 [phone]

## 2012-11-19 NOTE — ED Provider Notes (Signed)
CSN: [id number]     Arrival date & time 11/19/12  1215 History     First MD Initiated Contact with Patient 11/19/12 1236     Chief Complaint  Patient presents with  . Hand Pain   (Consider location/radiation/quality/duration/timing/severity/associated sxs/prior Treatment) HPI Comments: Patient is a 32 year old female presents to the emergency department for right distal ring finger pain and swelling with some purulent drainage over the last week. 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 Patient rates her pain time to time with no alleviating factors. Patient states her pain is worsened with palpation and movement. Denies fevers or chills.   Past Medical History  Diagnosis Date  . Gall stones   . Heart murmur     childhood  . Headache(784.0)   . Anemia    Past Surgical History  Procedure Laterality Date  . Tubal ligation    . Cholecystectomy  10/17/2011  . Tonsillectomy    . Cholecystectomy  10/17/2011    Procedure: LAPAROSCOPIC CHOLECYSTECTOMY;  Surgeon: Shelly Rubenstein, MD;  Location: [facility] OR;  Service: General;  Laterality: N/A;   History reviewed. No pertinent family history. History  Substance Use Topics  . Smoking status: Current Every Day Smoker -- 0.50 packs/day for 5 years    Types: Cigarettes  . Smokeless tobacco: Never Used  . Alcohol Use: Yes     Comment: occasional   OB History   Grav Para Term Preterm Abortions TAB SAB Ect Mult Living                 Review of Systems  Constitutional: Negative for fever.  Musculoskeletal: Negative for joint swelling.  Skin: Positive for color change. Negative for rash.    Allergies  Review of patient's allergies indicates no known allergies.  Home Medications   Current Outpatient Rx  Name  Route  Sig  Dispense  Refill  . cephALEXin (KEFLEX) 500 MG capsule   Oral   Take 1 capsule (500 mg total) by mouth 4 (four) times daily.   28 capsule   0    BP  136/87  Pulse 81  Temp(Src) 98.5 F (36.9 C) (Oral)  Resp 18  SpO2 100% Physical Exam  Constitutional: She is oriented to person, place, and time. She appears well-developed and well-nourished. No distress.  HENT:  Head: Normocephalic and atraumatic.  Eyes: Conjunctivae are normal.  Neck: Neck supple.  Musculoskeletal:       Right forearm: She exhibits tenderness.       Arms: Tender swollen area around right ring finger fingernail without fluctuance. Able to express some purulent discharge. Range of motion intact. Sensation intact. Refill less than 2 seconds.  Neurological: She is alert and oriented to person, place, and time.  Skin: Skin is warm and dry. She is not diaphoretic.  Psychiatric: She has a normal mood and affect.    ED Course   Procedures (including critical care time)  Labs Reviewed - No data to display No results found. 1. Paronychia of finger, right     MDM  R ring finger neurovascularly intact w/ swelling around the nail bed. Purulent drainage was expressed from area. No need to further open up paronychia. Given exposure to bacterial at work pt will be started on Abx. Advised to soak finger in clean warm water to help with drainage. Return precautions discussed. Advised PCP f/u. Patient is agreeable to plan. Patient is stable at time of discharge  Jeannetta Ellis, PA-C 11/19/12 1547

## 2015-05-25 ENCOUNTER — Emergency Department (INDEPENDENT_AMBULATORY_CARE_PROVIDER_SITE_OTHER)
Admission: EM | Admit: 2015-05-25 | Discharge: 2015-05-25 | Disposition: A | Discharge status: Home / Self Care | Payer: Self-pay | Source: Home / Self Care | Attending: Family Medicine | Admitting: Family Medicine

## 2015-05-25 ENCOUNTER — Encounter (HOSPITAL_COMMUNITY): Payer: Self-pay

## 2015-05-25 DIAGNOSIS — N912 Amenorrhea, unspecified: Secondary | ICD-10-CM

## 2015-05-25 NOTE — Discharge Instructions (Signed)
Primary Amenorrhea Primary amenorrhea is the absence of any menstrual flow in a female by the age of 27 years. An average age for the start of menstruation is the age of 46 years. Primary amenorrhea is not considered to have occurred until a female is older than 15 years and has never menstruated. This may occur with or without other signs of puberty. CAUSES  Some common causes of not menstruating include:  Chromosomal abnormality causing the ovaries to malfunction is the most common cause of primary amenorrhea.  Malnutrition.  Low blood sugar (hypoglycemia).  Polycystic ovary syndrome (cysts in the ovaries, not ovulating).  Absence of the vagina, uterus, or ovaries since birth (congenital).  Extreme obesity.  Cystic fibrosis.  Drastic weight loss from any cause.  Over-exercising (running, biking) causing loss of body fat.  Pituitary gland tumor in the brain.  Long-term (chronic) illnesses.  Cushing disease.  Thyroid disease (hypothyroidism, hyperthyroidism).  Part of the brain (hypothalamus) not functioning normally.  Premature ovarian failure. SYMPTOMS  No menstruation by age 31 years in normally developed females is the primary symptom. Other symptoms may include:  Discharge from the breasts.  Hot flashes.  Adult acne.  Facial or chest hair.  Headaches.  Impaired vision.  Recent stress.  Changes in weight, diet, or exercise patterns. DIAGNOSIS  Primary amenorrhea is diagnosed with the help of a medical history and a physical exam. Other tests that may be recommended include:  Blood tests to check for pregnancy, hormonal changes, a bleeding or thyroid disorder, low iron levels (anemia), or other problems.  Urine tests.  Specialized X-ray exams. TREATMENT  Treatment will depend on the cause. For example, some of the causes of primary amenorrhea, such as congenital absence of sex organs, will require surgery to correct. Others may respond to treatment  with medicine. SEEK MEDICAL CARE IF:  There has not been any menstrual flow by age 42 years.  Body maturation does not occur at a level typical of peers.  Pelvic area pain occurs.  There is unusual weight gain or hair growth.   This information is not intended to replace advice given to you by your health care provider. Make sure you discuss any questions you have with your health care provider.   Document Released: 03/25/2005 Document Revised: 04/15/2014 Document Reviewed: 11/04/2012 Elsevier Interactive Patient Education [employer].

## 2015-05-25 NOTE — ED Provider Notes (Signed)
CSN: [id number]     Arrival date & time 05/25/15  1318 History   First MD Initiated Contact with Patient 05/25/15 1455     Chief Complaint  Patient presents with  . Emesis  . Nausea   (Consider location/radiation/quality/duration/timing/severity/associated sxs/prior Treatment) HPI Patient presents with chief complaint that she has not had a period in 1 year. She has no pain she states that she has had a tubal ligation in the past approximately 9 years ago she states that she does not believe that she is pregnant. She has also had a bit of nausea and vomiting over the last couple of days. He states that she has been very afraid to go to the doctor for the last year to have her periods checked. She denies any other symptoms at this time. Past Medical History  Diagnosis Date  . Gall stones   . Heart murmur     childhood  . Headache(784.0)   . Anemia    Past Surgical History  Procedure Laterality Date  . Tubal ligation    . Cholecystectomy  10/17/2011  . Tonsillectomy    . Cholecystectomy  10/17/2011    Procedure: LAPAROSCOPIC CHOLECYSTECTOMY;  Surgeon: Harl Bowie, MD;  Location: [place];  Service: General;  Laterality: N/A;   History reviewed. No pertinent family history. Social History  Substance Use Topics  . Smoking status: Current Every Day Smoker -- 0.50 packs/day for 5 years    Types: Cigarettes  . Smokeless tobacco: Never Used  . Alcohol Use: Yes     Comment: occasional   OB History    No data available     Review of Systems Amenorrhea Allergies  Review of patient's allergies indicates no known allergies.  Home Medications   Prior to Admission medications   Medication Sig Start Date End Date Taking? Authorizing Provider  cephALEXin (KEFLEX) 500 MG capsule Take 1 capsule (500 mg total) by mouth 4 (four) times daily. 11/19/12   Baron Sane, PA-C   Meds Ordered and Administered this Visit  Medications - No data to display  BP 148/92 mmHg  Pulse 90   Temp(Src) 98.1 F (36.7 C) (Oral)  Resp 16  SpO2 100% No data found.   Physical Exam  Constitutional: She is oriented to person, place, and time. She appears well-developed and well-nourished.  HENT:  Head: Normocephalic and atraumatic.  Eyes: Conjunctivae are normal.  Musculoskeletal: Normal range of motion.  Neurological: She is alert and oriented to person, place, and time.  Skin: Skin is warm and dry.  Psychiatric: She has a normal mood and affect.  Vitals reviewed.   ED Course  Procedures (including critical care time)  Labs Review Labs Reviewed - No data to display  Imaging Review No results found.   Visual Acuity Review  Right Eye Distance:   Left Eye Distance:   Bilateral Distance:    Right Eye Near:   Left Eye Near:    Bilateral Near:        I have discussed with patient the utility of obtaining a urinalysis for both infection and possible pregnancy. Patient states that she would like to decline doing the test because she knows that she is not pregnant. And she would like to decline a pelvic examination at this time. She states that she will go to [facility] and discussed this with a gynecologist. I have agreed that this is probably most appropriate. MDM   1. Amenorrhea    Patient is advised to continue  home symptomatic treatment.  Patient is advised that if there are new or worsening symptoms or attend the emergency department, or contact primary care provider. Instructions of care provided discharged home in stable condition. Return to work/school note provided.  THIS NOTE WAS GENERATED USING A VOICE RECOGNITION SOFTWARE PROGRAM. ALL REASONABLE EFFORTS  WERE MADE TO PROOFREAD THIS DOCUMENT FOR ACCURACY.     Konrad Felix, [place] 05/25/15 [phone]

## 2015-05-25 NOTE — ED Notes (Signed)
Pt stated that she has not a menstrual cycle in a year but has had her tubes tied and she has recently been having nausea and vomiting.  Pt has had not abdominal pain Pt alert and oriented

## 2015-06-17 ENCOUNTER — Emergency Department (HOSPITAL_COMMUNITY)
Admission: EM | Admit: 2015-06-17 | Discharge: 2015-06-17 | Disposition: A | Discharge status: Home / Self Care | Payer: Self-pay | Attending: Emergency Medicine | Admitting: Emergency Medicine

## 2015-06-17 ENCOUNTER — Encounter (HOSPITAL_COMMUNITY): Payer: Self-pay

## 2015-06-17 DIAGNOSIS — Z792 Long term (current) use of antibiotics: Secondary | ICD-10-CM | POA: Insufficient documentation

## 2015-06-17 DIAGNOSIS — R5382 Chronic fatigue, unspecified: Secondary | ICD-10-CM

## 2015-06-17 DIAGNOSIS — N390 Urinary tract infection, site not specified: Secondary | ICD-10-CM | POA: Insufficient documentation

## 2015-06-17 DIAGNOSIS — F17210 Nicotine dependence, cigarettes, uncomplicated: Secondary | ICD-10-CM | POA: Insufficient documentation

## 2015-06-17 DIAGNOSIS — Z8719 Personal history of other diseases of the digestive system: Secondary | ICD-10-CM | POA: Insufficient documentation

## 2015-06-17 DIAGNOSIS — Z862 Personal history of diseases of the blood and blood-forming organs and certain disorders involving the immune mechanism: Secondary | ICD-10-CM | POA: Insufficient documentation

## 2015-06-17 DIAGNOSIS — R5383 Other fatigue: Secondary | ICD-10-CM | POA: Insufficient documentation

## 2015-06-17 DIAGNOSIS — R011 Cardiac murmur, unspecified: Secondary | ICD-10-CM | POA: Insufficient documentation

## 2015-06-17 DIAGNOSIS — Z3202 Encounter for pregnancy test, result negative: Secondary | ICD-10-CM | POA: Insufficient documentation

## 2015-06-17 LAB — POC URINE PREG, ED: Preg Test, Ur: NEGATIVE

## 2015-06-17 LAB — URINE MICROSCOPIC-ADD ON

## 2015-06-17 LAB — URINALYSIS, ROUTINE W REFLEX MICROSCOPIC
BILIRUBIN URINE: NEGATIVE
GLUCOSE, UA: NEGATIVE mg/dL
KETONES UR: NEGATIVE mg/dL
Nitrite: POSITIVE — AB
PH: 5.5 (ref 5.0–8.0)
Protein, ur: NEGATIVE mg/dL
Specific Gravity, Urine: 1.015 (ref 1.005–1.030)

## 2015-06-17 LAB — CBC
HEMATOCRIT: 37.8 % (ref 36.0–46.0)
Hemoglobin: 12.1 g/dL (ref 12.0–15.0)
MCH: 24.3 pg — AB (ref 26.0–34.0)
MCHC: 32 g/dL (ref 30.0–36.0)
MCV: 75.9 fL — AB (ref 78.0–100.0)
PLATELETS: 327 10*3/uL (ref 150–400)
RBC: 4.98 MIL/uL (ref 3.87–5.11)
RDW: 15.4 % (ref 11.5–15.5)
WBC: 7.8 10*3/uL (ref 4.0–10.5)

## 2015-06-17 LAB — COMPREHENSIVE METABOLIC PANEL
ALBUMIN: 3.5 g/dL (ref 3.5–5.0)
ALT: 19 U/L (ref 14–54)
AST: 21 U/L (ref 15–41)
Alkaline Phosphatase: 89 U/L (ref 38–126)
Anion gap: 13 (ref 5–15)
BUN: 8 mg/dL (ref 6–20)
CHLORIDE: 99 mmol/L — AB (ref 101–111)
CO2: 28 mmol/L (ref 22–32)
CREATININE: 0.98 mg/dL (ref 0.44–1.00)
Calcium: 9.2 mg/dL (ref 8.9–10.3)
GFR calc Af Amer: >60 mL/min (ref >60)
GFR calc non Af Amer: >60 mL/min (ref >60)
GLUCOSE: 103 mg/dL — AB (ref 65–99)
POTASSIUM: 3.9 mmol/L (ref 3.5–5.1)
Sodium: 140 mmol/L (ref 135–145)
Total Bilirubin: 0.5 mg/dL (ref 0.3–1.2)
Total Protein: 7.5 g/dL (ref 6.5–8.1)

## 2015-06-17 LAB — LIPASE, BLOOD: LIPASE: 20 U/L (ref 11–51)

## 2015-06-17 MED ORDER — ONDANSETRON 4 MG PO TBDP
4.0000 mg | ORAL_TABLET | Freq: Once | ORAL | Status: AC
  Administered 2015-06-17: 4 mg via ORAL
  Filled 2015-06-17: qty 1

## 2015-06-17 MED ORDER — ONDANSETRON 4 MG PO TBDP
ORAL_TABLET | ORAL | Status: AC
  Filled 2015-06-17: qty 1

## 2015-06-17 MED ORDER — CEPHALEXIN 500 MG PO CAPS: 500.0000 mg | ORAL_CAPSULE | Freq: Four times a day (QID) | ORAL | Status: DC

## 2015-06-17 MED ORDER — ONDANSETRON 4 MG PO TBDP
4.0000 mg | ORAL_TABLET | Freq: Once | ORAL | Status: AC | PRN
  Administered 2015-06-17: 4 mg via ORAL

## 2015-06-17 NOTE — ED Notes (Signed)
Pt stable, ambulatory, states understanding of discharge instructions 

## 2015-06-17 NOTE — Discharge Instructions (Signed)

## 2015-06-17 NOTE — ED Notes (Signed)
Nausea and vomiting for over 2  Months.  Pt. Feels very tired and exhausted.   Pt. Also has not had a period in 2 years.  Pt. Also having intermittent dizziness. Pt. Also having a headache. Skin is warm and dry.  Pt. Is alert and oriented X4

## 2015-06-17 NOTE — ED Provider Notes (Signed)
CSN: [id number]     Arrival date & time 06/17/15  1804 History   First MD Initiated Contact with Patient 06/17/15 2217     Chief Complaint  Patient presents with  . Emesis     (Consider location/radiation/quality/duration/timing/severity/associated sxs/prior Treatment) Patient is a 35 y.o. female presenting with vomiting. The history is provided by the patient. No language interpreter was used.  Emesis Severity:  Moderate Timing:  Constant Number of daily episodes:  Multiple Quality:  Undigested food Able to tolerate:  Liquids Progression:  Worsening Chronicity:  New Relieved by:  Nothing Worsened by:  Nothing tried Associated symptoms: no abdominal pain   Risk factors: no alcohol use, no diabetes and not pregnant now   Pt reports no period in 2 years.  Pt complains of nausea, tired,  Past Medical History  Diagnosis Date  . Gall stones   . Heart murmur     childhood  . Headache(784.0)   . Anemia    Past Surgical History  Procedure Laterality Date  . Tubal ligation    . Cholecystectomy  10/17/2011  . Tonsillectomy    . Cholecystectomy  10/17/2011    Procedure: LAPAROSCOPIC CHOLECYSTECTOMY;  Surgeon: Harl Bowie, MD;  Location: [place];  Service: General;  Laterality: N/A;   No family history on file. Social History  Substance Use Topics  . Smoking status: Current Every Day Smoker -- 0.50 packs/day for 5 years    Types: Cigarettes  . Smokeless tobacco: Never Used  . Alcohol Use: Yes     Comment: occasional   OB History    No data available     Review of Systems  Gastrointestinal: Positive for vomiting. Negative for abdominal pain.  All other systems reviewed and are negative.     Allergies  Review of patient's allergies indicates no known allergies.  Home Medications   Prior to Admission medications   Medication Sig Start Date End Date Taking? Authorizing Provider  cephALEXin (KEFLEX) 500 MG capsule Take 1 capsule (500 mg total) by mouth 4 (four)  times daily. 11/19/12   Jennifer Piepenbrink, PA-C   BP 128/106 mmHg  Temp(Src) 98.6 F (37 C) (Oral)  Resp 18  Ht 5\' 6"  (1.676 m)  Wt 124.314 kg  BMI 44.26 kg/m2  SpO2 100% Physical Exam  Constitutional: She is oriented to person, place, and time. She appears well-developed and well-nourished.  HENT:  Head: Normocephalic.  Eyes: EOM are normal. Pupils are equal, round, and reactive to light.  Neck: Normal range of motion.  Pulmonary/Chest: Effort normal.  Abdominal: Soft. She exhibits no distension.  Musculoskeletal: Normal range of motion.  Neurological: She is alert and oriented to person, place, and time.  Skin: Skin is warm.  Psychiatric: She has a normal mood and affect.  Nursing note and vitals reviewed.   ED Course  Procedures (including critical care time) Labs Review Labs Reviewed  COMPREHENSIVE METABOLIC PANEL - Abnormal; Notable for the following:    Chloride 99 (*)    Glucose, Bld 103 (*)    All other components within normal limits  CBC - Abnormal; Notable for the following:    MCV 75.9 (*)    MCH 24.3 (*)    All other components within normal limits  URINALYSIS, ROUTINE W REFLEX MICROSCOPIC (NOT AT [facility]) - Abnormal; Notable for the following:    APPearance CLOUDY (*)    Hgb urine dipstick SMALL (*)    Nitrite POSITIVE (*)    Leukocytes, UA TRACE (*)  All other components within normal limits  URINE MICROSCOPIC-ADD ON - Abnormal; Notable for the following:    Squamous Epithelial / LPF 0-5 (*)    Bacteria, UA MANY (*)    All other components within normal limits  LIPASE, BLOOD  POC URINE PREG, ED    Imaging Review No results found. I have personally reviewed and evaluated these images and lab results as part of my medical decision-making.   EKG Interpretation None      MDM Pt has many bacteria.  I will culture and cover for uti.   Pt advised to follow up at gyn clinic for evaluation.  Pt advised primary care    Final diagnoses:  UTI (lower  urinary tract infection)  Chronic fatigue    Meds ordered this encounter  Medications  . ondansetron (ZOFRAN-ODT) disintegrating tablet 4 mg    Sig:   . ondansetron (ZOFRAN-ODT) 4 MG disintegrating tablet    Sig:     Berdik, Lori   : cabinet override  . ondansetron (ZOFRAN-ODT) disintegrating tablet 4 mg    Sig:   . cephALEXin (KEFLEX) 500 MG capsule    Sig: Take 1 capsule (500 mg total) by mouth 4 (four) times daily.    Dispense:  28 capsule    Refill:  0    Order Specific Question:  Supervising Provider    Answer:  Noemi Chapel [[id number]]   An After Visit Summary was printed and given to the patient.   Hollace Kinnier Hoskins, PA-C 06/17/15 [place], MD 06/17/15 [phone]

## 2015-06-23 ENCOUNTER — Inpatient Hospital Stay (HOSPITAL_COMMUNITY)
Admission: AD | Admit: 2015-06-23 | Discharge: 2015-06-23 | Disposition: A | Discharge status: Home / Self Care | Payer: Self-pay | Source: Ambulatory Visit | Attending: Obstetrics & Gynecology | Admitting: Obstetrics & Gynecology

## 2015-06-23 ENCOUNTER — Encounter (HOSPITAL_COMMUNITY): Payer: Self-pay

## 2015-06-23 DIAGNOSIS — Z3202 Encounter for pregnancy test, result negative: Secondary | ICD-10-CM | POA: Insufficient documentation

## 2015-06-23 DIAGNOSIS — F17210 Nicotine dependence, cigarettes, uncomplicated: Secondary | ICD-10-CM | POA: Insufficient documentation

## 2015-06-23 DIAGNOSIS — R519 Headache, unspecified: Secondary | ICD-10-CM

## 2015-06-23 DIAGNOSIS — N911 Secondary amenorrhea: Secondary | ICD-10-CM | POA: Insufficient documentation

## 2015-06-23 DIAGNOSIS — R51 Headache: Secondary | ICD-10-CM | POA: Insufficient documentation

## 2015-06-23 DIAGNOSIS — R5382 Chronic fatigue, unspecified: Secondary | ICD-10-CM | POA: Insufficient documentation

## 2015-06-23 LAB — URINALYSIS, ROUTINE W REFLEX MICROSCOPIC
BILIRUBIN URINE: NEGATIVE
Glucose, UA: NEGATIVE mg/dL
HGB URINE DIPSTICK: NEGATIVE
KETONES UR: NEGATIVE mg/dL
Leukocytes, UA: NEGATIVE
NITRITE: POSITIVE — AB
PH: 6.5 (ref 5.0–8.0)
Protein, ur: NEGATIVE mg/dL
Specific Gravity, Urine: 1.01 (ref 1.005–1.030)

## 2015-06-23 LAB — URINE MICROSCOPIC-ADD ON

## 2015-06-23 LAB — POCT PREGNANCY, URINE: Preg Test, Ur: NEGATIVE

## 2015-06-23 MED ORDER — IBUPROFEN 600 MG PO TABS: 600.0000 mg | ORAL_TABLET | Freq: Four times a day (QID) | ORAL | Status: DC | PRN

## 2015-06-23 MED ORDER — IBUPROFEN 600 MG PO TABS
600.0000 mg | ORAL_TABLET | Freq: Once | ORAL | Status: AC
  Administered 2015-06-23: 600 mg via ORAL
  Filled 2015-06-23: qty 1

## 2015-06-23 MED ORDER — MEDROXYPROGESTERONE ACETATE 10 MG PO TABS: 10.0000 mg | ORAL_TABLET | Freq: Every day | ORAL | Status: DC

## 2015-06-23 MED ORDER — PROMETHAZINE HCL 25 MG PO TABS: 25.0000 mg | ORAL_TABLET | Freq: Four times a day (QID) | ORAL | Status: DC | PRN

## 2015-06-23 NOTE — MAU Note (Addendum)
Has not a period in 2 years, having headaches, N/V/D, tiredness. 3 episodes of diarrhea today, was seen at [facility] ED about a week ago told her BP was high, couldn't go to work today felt too bad.

## 2015-06-23 NOTE — Discharge Instructions (Signed)
Secondary Amenorrhea Secondary amenorrhea is the stopping of menstrual flow for 3-6 months in a female who has previously had periods. There are many possible causes. Most of these causes are not serious. Usually, treating the underlying problem causing the loss of menses will return your periods to normal. CAUSES  Some common and uncommon causes of not menstruating include:  Malnutrition.  Low blood sugar (hypoglycemia).  Polycystic ovary disease.  Stress or fear.  Breastfeeding.  Hormone imbalance.  Ovarian failure.  Medicines.  Extreme obesity.  Cystic fibrosis.  Low body weight or drastic weight reduction from any cause.  Early menopause.  Removal of ovaries or uterus.  Contraceptives.  Illness.  Long-term (chronic) illnesses.  Cushing syndrome.  Thyroid problems.  Birth control pills, patches, or vaginal rings for birth control. RISK FACTORS You may be at greater risk of secondary amenorrhea if:  You have a family history of this condition.  You have an eating disorder.  You do athletic training. DIAGNOSIS  A diagnosis is made by your health care provider taking a medical history and doing a physical exam. This will include a pelvic exam to check for problems with your reproductive organs. Pregnancy must be ruled out. Often, numerous blood tests are done to measure different hormones in the body. Urine testing may be done. Specialized exams (ultrasound, CT scan, MRI, or hysteroscopy) may have to be done as well as measuring the body mass index (BMI). TREATMENT  Treatment depends on the cause of the amenorrhea. If an eating disorder is present, this can be treated with an adequate diet and therapy. Chronic illnesses may improve with treatment of the illness. Amenorrhea may be corrected with medicines, lifestyle changes, or surgery. If the amenorrhea cannot be corrected, it is sometimes possible to create a false menstruation with medicines. HOME CARE  INSTRUCTIONS  Maintain a healthy diet.  Manage weight problems.  Exercise regularly but not excessively.  Get adequate sleep.  Manage stress.  Be aware of changes in your menstrual cycle. Keep a record of when your periods occur. Note the date your period starts, how long it lasts, and any problems. SEEK MEDICAL CARE IF: Your symptoms do not get better with treatment.   This information is not intended to replace advice given to you by your health care provider. Make sure you discuss any questions you have with your health care provider.   Document Released: 05/06/2006 Document Revised: 04/15/2014 Document Reviewed: 09/10/2012 Elsevier Interactive Patient Education 2016 [place] Headache Without Cause A headache is pain or discomfort felt around the head or neck area. The specific cause of a headache may not be found. There are many causes and types of headaches. A few common ones are:  Tension headaches.  Migraine headaches.  Cluster headaches.  Chronic daily headaches. HOME CARE INSTRUCTIONS  Watch your condition for any changes. Take these steps to help with your condition: Managing Pain  Take over-the-counter and prescription medicines only as told by your health care provider.  Lie down in a dark, quiet room when you have a headache.  If directed, apply ice to the head and neck area:  Put ice in a plastic bag.  Place a towel between your skin and the bag.  Leave the ice on for 20 minutes, 2-3 times per day.  Use a heating pad or hot shower to apply heat to the head and neck area as told by your health care provider.  Keep lights dim if bright lights bother  you or make your headaches worse. Eating and Drinking  Eat meals on a regular schedule.  Limit alcohol use.  Decrease the amount of caffeine you drink, or stop drinking caffeine. General Instructions  Keep all follow-up visits as told by your health care provider. This is  important.  Keep a headache journal to help find out what may trigger your headaches. For example, write down:  What you eat and drink.  How much sleep you get.  Any change to your diet or medicines.  Try massage or other relaxation techniques.  Limit stress.  Sit up straight, and do not tense your muscles.  Do not use tobacco products, including cigarettes, chewing tobacco, or e-cigarettes. If you need help quitting, ask your health care provider.  Exercise regularly as told by your health care provider.  Sleep on a regular schedule. Get 7-9 hours of sleep, or the amount recommended by your health care provider. SEEK MEDICAL CARE IF:   Your symptoms are not helped by medicine.  You have a headache that is different from the usual headache.  You have nausea or you vomit.  You have a fever. SEEK IMMEDIATE MEDICAL CARE IF:   Your headache becomes severe.  You have repeated vomiting.  You have a stiff neck.  You have a loss of vision.  You have problems with speech.  You have pain in the eye or ear.  You have muscular weakness or loss of muscle control.  You lose your balance or have trouble walking.  You feel faint or pass out.  You have confusion.   This information is not intended to replace advice given to you by your health care provider. Make sure you discuss any questions you have with your health care provider.   Document Released: 03/25/2005 Document Revised: 12/14/2014 Document Reviewed: 07/18/2014 Elsevier Interactive Patient Education [employer].

## 2015-06-23 NOTE — MAU Provider Note (Signed)
Chief Complaint: Amenorrhea; Emesis; Diarrhea; and Headache  First Provider Initiated Contact with Patient 06/23/15 2251      SUBJECTIVE HPI: Sabrina Harding is a 35 y.o. G3P0 female who presents to [facility] reporting: 1. No menstrual period x 2 years. Used to have regular monthly cycles. Is not on any birth control. Doesn't have a Editor, commissioning and has not had any work-up for this problem. 2. N/V/D that she was seen in [facility] ED on 06/17/15, improving, but still felt too bad to got to work today. The loose stools today.  3. Concerned because she was told her BP was elevated at [facility] last week. No Hx HTN.  4. Concerned because her energy level has been very low "for a while" Has not seen PCP for this problem. No anemia at [facility] ED last week. 5. Moderate HA. Has Hx of same. Hasn't tried anything for pain. No fever, chills, vision changes, difficulties w/ speech or gait.  Was Dx'd w/ UTI at [facility]. Taking Keflex as directed. Sx resolved. Denies associated fever, chills, flanks pain.   Past Medical History  Diagnosis Date  . Gall stones   . Heart murmur     childhood  . Headache(784.0)   . Anemia    OB History  Gravida Para Term Preterm AB SAB TAB Ectopic Multiple Living  3         3    # Outcome Date GA Lbr Len/2nd Weight Sex Delivery Anes PTL Lv  3 Gravida      Vag-Spont   Y  2 Gravida      Vag-Spont   Y  1 Gravida      Vag-Spont   Y     Past Surgical History  Procedure Laterality Date  . Tubal ligation    . Cholecystectomy  10/17/2011  . Tonsillectomy    . Cholecystectomy  10/17/2011    Procedure: LAPAROSCOPIC CHOLECYSTECTOMY;  Surgeon: Harl Bowie, MD;  Location: [facility];  Service: General;  Laterality: N/A;   Social History   Social History  . Marital Status: Single    Spouse Name: N/A  . Number of Children: N/A  . Years of Education: N/A   Occupational History  . Not on file.   Social History Main Topics  . Smoking status: Current Every Day Smoker --  0.50 packs/day for 5 years    Types: Cigarettes  . Smokeless tobacco: Never Used  . Alcohol Use: Yes     Comment: occasional  . Drug Use: No  . Sexual Activity: Yes   Other Topics Concern  . Not on file   Social History Narrative   No current facility-administered medications on file prior to encounter.   Current Outpatient Prescriptions on File Prior to Encounter  Medication Sig Dispense Refill  . cephALEXin (KEFLEX) 500 MG capsule Take 1 capsule (500 mg total) by mouth 4 (four) times daily. 28 capsule 0   No Known Allergies  I have reviewed the past Medical Hx, Surgical Hx, Social Hx, Allergies and Medications.   Review of Systems  Constitutional: Negative for fever, chills and unexpected weight change.  Gastrointestinal: Positive for nausea, vomiting and diarrhea. Negative for abdominal pain, constipation and abdominal distention.  Endocrine: Negative for cold intolerance and heat intolerance.  Genitourinary: Positive for menstrual problem. Negative for dysuria, frequency, hematuria, flank pain, vaginal bleeding, vaginal discharge and pelvic pain.  Neurological: Negative for dizziness.    OBJECTIVE Patient Vitals for the past 24 hrs:  BP Temp Temp  src Pulse Resp SpO2 Height Weight  06/23/15 2207 112/72 mmHg 98.8 F (37.1 C) Oral 77 16 100 % - -  06/23/15 1900 125/78 mmHg 98.4 F (36.9 C) Oral - 18 - 5\' 5"  (1.651 m) 270 lb 12.8 oz (122.834 kg)   Constitutional: Well-developed, well-nourished, obese female in no acute distress.  Head: Mucus membranes moist Cardiovascular: normal rate Respiratory: normal rate and effort.  GI: Abd soft, non-tender. Pos BS x 4 Neurologic: Alert and oriented x 4.  GU: Neg CVAT.  LAB RESULTS Results for orders placed or performed during the hospital encounter of 06/23/15 (from the past 24 hour(s))  Urinalysis, Routine w reflex microscopic (not at [facility])     Status: Abnormal   Collection Time: 06/23/15  7:04 PM  Result Value Ref Range    Color, Urine YELLOW YELLOW   APPearance CLEAR CLEAR   Specific Gravity, Urine 1.010 1.005 - 1.030   pH 6.5 5.0 - 8.0   Glucose, UA NEGATIVE NEGATIVE mg/dL   Hgb urine dipstick NEGATIVE NEGATIVE   Bilirubin Urine NEGATIVE NEGATIVE   Ketones, ur NEGATIVE NEGATIVE mg/dL   Protein, ur NEGATIVE NEGATIVE mg/dL   Nitrite POSITIVE (A) NEGATIVE   Leukocytes, UA NEGATIVE NEGATIVE  Urine microscopic-add on     Status: Abnormal   Collection Time: 06/23/15  7:04 PM  Result Value Ref Range   Squamous Epithelial / LPF 0-5 (A) NONE SEEN   WBC, UA 0-5 0 - 5 WBC/hpf   RBC / HPF 0-5 0 - 5 RBC/hpf   Bacteria, UA MANY (A) NONE SEEN  Pregnancy, urine POC     Status: None   Collection Time: 06/23/15  7:18 PM  Result Value Ref Range   Preg Test, Ur NEGATIVE NEGATIVE    IMAGING No results found.  MAU COURSE UPT, UA, ibuprofen. Offered antiemetic, declined.  HA resolved. No N/V/D in MAU.    MDM - 35 year-old non-pregnant female w/ secondary amenorrhea of unknown etiology. Will try Provera to stimulate a withdrawal bleed. F/U for additional work-up in Gyn clinic. - N/V/D likely viral gastroenteritis, improving. Keeping down PO's. No dehydration.  - UTI resolving. No evidence of pyelo. - Normotensive today.   ASSESSMENT 1. Nonintractable headache   2. Chronic fatigue   3. Secondary amenorrhea     PLAN Discharge home in stable condition. HA Precautions Provera for withdrawal bleed. In-basket message sent to [facility].  Given contact info for PCP. Discussed role of ED's vs office PCP, Gynecologist.      Follow-up Information    Follow up with [facility].   Why:  Primary Care   Contact information:   [address] [place] [phone]      Follow up with [facility].   Why:  Primary Care   Contact information:   [address] [place] [phone]      Follow up with [facility].   Specialty:  Obstetrics and Gynecology   Why:  Will call you to schedule Gynecology appointment   Contact information:   [place] [phone]      Follow up with [facility].   Why:  As needed in emergencies   Contact information:   [address] [id number] [place] [phone]       Medication List    TAKE these medications  cephALEXin 500 MG capsule  Commonly known as:  KEFLEX  Take 1 capsule (500 mg total) by mouth 4 (four) times daily.     ibuprofen 600 MG tablet  Commonly known as:  ADVIL,MOTRIN  Take 1 tablet (600 mg total) by mouth every 6 (six) hours as needed.     medroxyPROGESTERone 10 MG tablet  Commonly known as:  PROVERA  Take 1 tablet (10 mg total) by mouth daily. Use for ten days     promethazine 25 MG tablet  Commonly known as:  PHENERGAN  Take 1 tablet (25 mg total) by mouth every 6 (six) hours as needed for nausea or vomiting.       [place], CNM 06/23/2015  11:15 PM

## 2015-07-09 ENCOUNTER — Encounter (HOSPITAL_COMMUNITY): Payer: Self-pay | Admitting: Emergency Medicine

## 2015-07-09 ENCOUNTER — Emergency Department (HOSPITAL_COMMUNITY): Payer: Self-pay

## 2015-07-09 ENCOUNTER — Emergency Department (HOSPITAL_COMMUNITY)
Admission: EM | Admit: 2015-07-09 | Discharge: 2015-07-09 | Disposition: A | Discharge status: Home / Self Care | Payer: Self-pay | Attending: Emergency Medicine | Admitting: Emergency Medicine

## 2015-07-09 DIAGNOSIS — Y9289 Other specified places as the place of occurrence of the external cause: Secondary | ICD-10-CM | POA: Insufficient documentation

## 2015-07-09 DIAGNOSIS — F17210 Nicotine dependence, cigarettes, uncomplicated: Secondary | ICD-10-CM | POA: Insufficient documentation

## 2015-07-09 DIAGNOSIS — S8251XA Displaced fracture of medial malleolus of right tibia, initial encounter for closed fracture: Secondary | ICD-10-CM | POA: Insufficient documentation

## 2015-07-09 DIAGNOSIS — Z8719 Personal history of other diseases of the digestive system: Secondary | ICD-10-CM | POA: Insufficient documentation

## 2015-07-09 DIAGNOSIS — W1849XA Other slipping, tripping and stumbling without falling, initial encounter: Secondary | ICD-10-CM | POA: Insufficient documentation

## 2015-07-09 DIAGNOSIS — Y9301 Activity, walking, marching and hiking: Secondary | ICD-10-CM | POA: Insufficient documentation

## 2015-07-09 DIAGNOSIS — S82891A Other fracture of right lower leg, initial encounter for closed fracture: Secondary | ICD-10-CM

## 2015-07-09 DIAGNOSIS — Z792 Long term (current) use of antibiotics: Secondary | ICD-10-CM | POA: Insufficient documentation

## 2015-07-09 DIAGNOSIS — Z79899 Other long term (current) drug therapy: Secondary | ICD-10-CM | POA: Insufficient documentation

## 2015-07-09 DIAGNOSIS — Z862 Personal history of diseases of the blood and blood-forming organs and certain disorders involving the immune mechanism: Secondary | ICD-10-CM | POA: Insufficient documentation

## 2015-07-09 DIAGNOSIS — E669 Obesity, unspecified: Secondary | ICD-10-CM | POA: Insufficient documentation

## 2015-07-09 DIAGNOSIS — R011 Cardiac murmur, unspecified: Secondary | ICD-10-CM | POA: Insufficient documentation

## 2015-07-09 DIAGNOSIS — Y998 Other external cause status: Secondary | ICD-10-CM | POA: Insufficient documentation

## 2015-07-09 MED ORDER — OXYCODONE-ACETAMINOPHEN 5-325 MG PO TABS: 2.0000 | ORAL_TABLET | ORAL | Status: DC | PRN

## 2015-07-09 MED ORDER — OXYCODONE-ACETAMINOPHEN 5-325 MG PO TABS
1.0000 | ORAL_TABLET | Freq: Once | ORAL | Status: AC
  Administered 2015-07-09: 1 via ORAL
  Filled 2015-07-09: qty 1

## 2015-07-09 NOTE — ED Notes (Signed)
Per EMS, pt walking outside when missed a step, slipping off uneven ground. Pt says heard a pop & felt pain. No crepitus or obvious deformity per EMS.

## 2015-07-09 NOTE — ED Provider Notes (Signed)
CSN: [id number]     Arrival date & time 07/09/15  1909 History   First MD Initiated Contact with Patient 07/09/15 1952     Chief Complaint  Patient presents with  . Ankle Pain     (Consider location/radiation/quality/duration/timing/severity/associated sxs/prior Treatment) HPI   35 year old female brought here via EMS for evaluation of ankle injury. Patient report prior to arrival she was at a cook out while walking down the steps she may have misstepped and fell down injuring her right ankle. She report that she felt a pop follows with acute onset of sharp throbbing pain throughout her ankle, 10 out of 10, nonradiating, unable to bear weight on her foot. She denies any hip or knee pain. She denies any precipitating symptoms prior to the fall. She did report feeling lightheadedness after injuring herself. She has not received any specific treatment aside from immobilizer that was placed by EMS. No complaints of numbness or weakness. She has sprained her ankle in the past.  Past Medical History  Diagnosis Date  . Gall stones   . Heart murmur     childhood  . Headache(784.0)   . Anemia    Past Surgical History  Procedure Laterality Date  . Tubal ligation    . Cholecystectomy  10/17/2011  . Tonsillectomy    . Cholecystectomy  10/17/2011    Procedure: LAPAROSCOPIC CHOLECYSTECTOMY;  Surgeon: Harl , MD;  Location: [facility] OR;  Service: General;  Laterality: N/A;   Family History  Problem Relation Age of Onset  . Hypertension Father    Social History  Substance Use Topics  . Smoking status: Current Every Day Smoker -- 0.50 packs/day for 5 years    Types: Cigarettes  . Smokeless tobacco: Never Used  . Alcohol Use: Yes     Comment: occasional   OB History    Gravida Para Term Preterm AB TAB SAB Ectopic Multiple Living   3         3     Review of Systems  Constitutional: Negative for fever.  Musculoskeletal: Positive for arthralgias.  Skin: Negative for rash and wound.   Neurological: Negative for numbness.      Allergies  Review of patient's allergies indicates no known allergies.  Home Medications   Prior to Admission medications   Medication Sig Start Date End Date Taking? Authorizing Provider  aspirin-acetaminophen-caffeine (EXCEDRIN MIGRAINE) [phone] MG tablet Take 1 tablet by mouth every 6 (six) hours as needed for headache.    Historical Provider, MD  cephALEXin (KEFLEX) 500 MG capsule Take 1 capsule (500 mg total) by mouth 4 (four) times daily. 06/17/15   Fransico Meadow, PA-C  ibuprofen (ADVIL,MOTRIN) 600 MG tablet Take 1 tablet (600 mg total) by mouth every 6 (six) hours as needed. 06/23/15   [name], CNM  medroxyPROGESTERone (PROVERA) 10 MG tablet Take 1 tablet (10 mg total) by mouth daily. Use for ten days 06/23/15   [name], CNM  promethazine (PHENERGAN) 25 MG tablet Take 1 tablet (25 mg total) by mouth every 6 (six) hours as needed for nausea or vomiting. 06/23/15   Virginia Smith, CNM   BP 122/90 mmHg  Pulse 85  Temp(Src) 98.1 F (36.7 C) (Oral)  Resp 20  SpO2 96% Physical Exam  Constitutional: She appears well-developed and well-nourished. No distress.  Obese African-American female laying in bed in no acute discomfort.  HENT:  Head: Atraumatic.  Eyes: Conjunctivae are normal.  Neck: Neck supple.  Musculoskeletal: She exhibits tenderness (Right ankle:  Diffuse tenderness to light palpation without obvious swelling or deformity noted. Decreased range of motion secondary to pain. Pain at fifth metatarsal region on palpation.).  Dorsalis pedis pulse palpable bilaterally with intact sensation throughout bilateral lower extremities. Brisk as refills to all toes. Right hip and right ankle nontender to palpation.  Neurological: She is alert.  Skin: No rash noted.  Psychiatric: She has a normal mood and affect.  Nursing note and vitals reviewed.   ED Course  Procedures (including critical care time) Labs Review Labs  Reviewed - No data to display  Imaging Review Dg Ankle Complete Right  07/09/2015  CLINICAL DATA:  [place] today and injured right ankle. EXAM: RIGHT ANKLE - COMPLETE 3+ VIEW COMPARISON:  None. FINDINGS: The ankle mortise is maintained. On the mortise view there is a small bony density between the tip of the medial malleolus and the talus. This is suspicious for a small avulsion fracture. No osteochondral abnormality. No fibular fracture. No joint effusion. The mid and hindfoot bony structures are intact. IMPRESSION: Small avulsion fracture near the distal tip of the medial malleolus. Electronically Signed   By: Marijo Sanes M.D.   On: 07/09/2015 20:41   Dg Foot Complete Right  07/09/2015  CLINICAL DATA:  Injured foot today. EXAM: RIGHT FOOT COMPLETE - 3+ VIEW COMPARISON:  Ankle films, same date. FINDINGS: The joint spaces are maintained. There is a small bony density near the tip of the medial malleolus which is most likely an avulsion fracture off of the talus. No other bony abnormalities are identified. IMPRESSION: Small avulsion fracture near the medial malleolus is most likely off of the medial talus. Electronically Signed   By: Marijo Sanes M.D.   On: 07/09/2015 20:43   I have personally reviewed and evaluated these images and lab results as part of my medical decision-making.   EKG Interpretation None      MDM   Final diagnoses:  Ankle fracture, right, closed, initial encounter    BP 122/90 mmHg  Pulse 85  Temp(Src) 98.1 F (36.7 C) (Oral)  Resp 20  SpO2 96%   Patient injured her right ankle and right foot when she misstepped. Significant tenderness noted to ankle on exam with decreased range of motion. No significant swelling or deformity noted  9:22 PM X-ray of right ankle demonstrate a small avulsion fracture near the distal tip of the medial malleolus. This is a closed injury. Patient will be provided with an Ace wrap, Cam Walker, as well as crutches for stability and  support. Pain medication prescribed. She will follow-up with orthopedics for further care. [place] therapy discussed.  [name], PA-C 07/09/15 [place], DO 07/09/15 2308

## 2015-07-09 NOTE — ED Notes (Signed)
Pt departed in NAD.  

## 2015-07-09 NOTE — Discharge Instructions (Signed)
Ankle Fracture A fracture is a break in a bone. The ankle joint is made up of three bones. These include the lower (distal)sections of your lower leg bones, called the tibia and fibula, along with a bone in your foot, called the talus. Depending on how bad the break is and if more than one ankle joint bone is broken, a cast or splint is used to protect and keep your injured bone from moving while it heals. Sometimes, surgery is required to help the fracture heal properly.  There are two general types of fractures:  Stable fracture. This includes a single fracture line through one bone, with no injury to ankle ligaments. A fracture of the talus that does not have any displacement (movement of the bone on either side of the fracture line) is also stable.  Unstable fracture. This includes more than one fracture line through one or more bones in the ankle joint. It also includes fractures that have displacement of the bone on either side of the fracture line. CAUSES  A direct blow to the ankle.   Quickly and severely twisting your ankle.  Trauma, such as a car accident or falling from a significant height. RISK FACTORS You may be at a higher risk of ankle fracture if:  You have certain medical conditions.  You are involved in high-impact sports.  You are involved in a high-impact car accident. SIGNS AND SYMPTOMS   Tender and swollen ankle.  Bruising around the injured ankle.  Pain on movement of the ankle.  Difficulty walking or putting weight on the ankle.  A cold foot below the site of the ankle injury. This can occur if the blood vessels passing through your injured ankle were also damaged.  Numbness in the foot below the site of the ankle injury. DIAGNOSIS  An ankle fracture is usually diagnosed with a physical exam and X-rays. A CT scan may also be required for complex fractures. TREATMENT  Stable fractures are treated with a cast or splint and using crutches to avoid putting  weight on your injured ankle. This is followed by an ankle strengthening program. Some patients require a special type of cast, depending on other medical problems they may have. Unstable fractures require surgery to ensure the bones heal properly. Your health care provider will tell you what type of fracture you have and the best treatment for your condition. HOME CARE INSTRUCTIONS   Review correct crutch use with your health care provider and use your crutches as directed. Safe use of crutches is extremely important. Misuse of crutches can cause you to fall or cause injury to nerves in your hands or armpits.  Do not put weight or pressure on the injured ankle until directed by your health care provider.  To lessen the swelling, keep the injured leg elevated while sitting or lying down.  Apply ice to the injured area:  Put ice in a plastic bag.  Place a towel between your cast and the bag.  Leave the ice on for 20 minutes, 2-3 times a day.  If you have a plaster or fiberglass cast:  Do not try to scratch the skin under the cast with any objects. This can increase your risk of skin infection.  Check the skin around the cast every day. You may put lotion on any red or sore areas.  Keep your cast dry and clean.  If you have a plaster splint:  Wear the splint as directed.  You may loosen the elastic   around the splint if your toes become numb, tingle, or turn cold or blue.  Do not put pressure on any part of your cast or splint; it may break. Rest your cast only on a pillow the first 24 hours until it is fully hardened.  Your cast or splint can be protected during bathing with a plastic bag sealed to your skin with medical tape. Do not lower the cast or splint into water.  Take medicines as directed by your health care provider. Only take over-the-counter or prescription medicines for pain, discomfort, or fever as directed by your health care provider.  Do not drive a vehicle until  your health care provider specifically tells you it is safe to do so.  If your health care provider has given you a follow-up appointment, it is very important to keep that appointment. Not keeping the appointment could result in a chronic or permanent injury, pain, and disability. If you have any problem keeping the appointment, call the facility for assistance. SEEK MEDICAL CARE IF: You develop increased swelling or discomfort. SEEK IMMEDIATE MEDICAL CARE IF:   Your cast gets damaged or breaks.  You have continued severe pain.  You develop new pain or swelling after the cast was put on.  Your skin or toenails below the injury turn blue or gray.  Your skin or toenails below the injury feel cold, numb, or have loss of sensitivity to touch.  There is a bad smell or pus draining from under the cast. MAKE SURE YOU:   Understand these instructions.  Will watch your condition.  Will get help right away if you are not doing well or get worse.   This information is not intended to replace advice given to you by your health care provider. Make sure you discuss any questions you have with your health care provider.   Document Released: 03/22/2000 Document Revised: 03/30/2013 Document Reviewed: 10/22/2012 Elsevier Interactive Patient Education 2016 [employer]  

## 2015-07-17 ENCOUNTER — Encounter: Payer: Self-pay | Admitting: Obstetrics & Gynecology

## 2015-07-17 ENCOUNTER — Ambulatory Visit (INDEPENDENT_AMBULATORY_CARE_PROVIDER_SITE_OTHER): Payer: Self-pay | Admitting: Obstetrics & Gynecology

## 2015-07-17 VITALS — BP 140/81 | HR 86 | Wt 270.8 lb

## 2015-07-17 DIAGNOSIS — N912 Amenorrhea, unspecified: Secondary | ICD-10-CM

## 2015-07-17 LAB — TSH: TSH: 0.79 m[IU]/L

## 2015-07-17 MED ORDER — MEGESTROL ACETATE 40 MG PO TABS: 40.0000 mg | ORAL_TABLET | Freq: Two times a day (BID) | ORAL | Status: DC

## 2015-07-17 NOTE — Progress Notes (Signed)
   Subjective:    Patient ID: Sabrina Harding, female    DOB: Jan 04, 1981, 35 y.o.   MRN: [id number]  HPI 35 yo SAAP3 (80, 24, and 53 yo kids) here for the issue of no periods for 2 years. She is having sex, using condoms and she had a BTL. [facility]. She took provera for 10 days but did not bleed.   Review of Systems     Objective:   Physical Exam        Assessment & Plan:  Preventative care-refer to free cancer screening clinic Extended amenorrhea- check TSH, gyn u/s

## 2015-07-26 ENCOUNTER — Ambulatory Visit (HOSPITAL_COMMUNITY)
Admission: RE | Admit: 2015-07-26 | Discharge: 2015-07-26 | Disposition: A | Discharge status: Home / Self Care | Payer: Self-pay | Source: Ambulatory Visit | Attending: Obstetrics & Gynecology | Admitting: Obstetrics & Gynecology

## 2015-07-26 DIAGNOSIS — N912 Amenorrhea, unspecified: Secondary | ICD-10-CM | POA: Insufficient documentation

## 2015-07-26 DIAGNOSIS — D251 Intramural leiomyoma of uterus: Secondary | ICD-10-CM | POA: Insufficient documentation

## 2015-07-26 DIAGNOSIS — D250 Submucous leiomyoma of uterus: Secondary | ICD-10-CM | POA: Insufficient documentation

## 2015-07-31 ENCOUNTER — Telehealth: Payer: Self-pay | Admitting: *Deleted

## 2015-07-31 NOTE — Telephone Encounter (Signed)
B[place]lin called today at 12:00 and left a message requesting results.  I called her and she wanted results of Her Ultrasound. I informed her I would need Dr. Hulan Fray to interpret results and tell us whether or not she Needed any follow up /treatment. I explained I would route to Dr. Hulan Fray and we will call her back when We Dr. Hulan Fray responds. She voices understanding.

## 2015-08-11 NOTE — Telephone Encounter (Signed)
Per Dr. Hulan Fray will need to make an appointment to come in and discuss results.   Called Sabrina Harding and left message I was calling back from her previous call. I am going to have front office Call next week with an appointment to come in to disuss results. If she does not receive a call -please call our office.

## 2015-08-24 ENCOUNTER — Encounter (HOSPITAL_COMMUNITY): Payer: Self-pay | Admitting: *Deleted

## 2015-08-24 ENCOUNTER — Inpatient Hospital Stay (HOSPITAL_COMMUNITY)
Admission: AD | Admit: 2015-08-24 | Discharge: 2015-08-25 | Disposition: A | Discharge status: Home / Self Care | Payer: Self-pay | Source: Ambulatory Visit | Attending: Obstetrics & Gynecology | Admitting: Obstetrics & Gynecology

## 2015-08-24 DIAGNOSIS — R109 Unspecified abdominal pain: Secondary | ICD-10-CM | POA: Insufficient documentation

## 2015-08-24 DIAGNOSIS — A5901 Trichomonal vulvovaginitis: Secondary | ICD-10-CM | POA: Insufficient documentation

## 2015-08-24 DIAGNOSIS — F17210 Nicotine dependence, cigarettes, uncomplicated: Secondary | ICD-10-CM | POA: Insufficient documentation

## 2015-08-24 DIAGNOSIS — Z7982 Long term (current) use of aspirin: Secondary | ICD-10-CM | POA: Insufficient documentation

## 2015-08-24 DIAGNOSIS — A599 Trichomoniasis, unspecified: Secondary | ICD-10-CM

## 2015-08-24 DIAGNOSIS — Z3202 Encounter for pregnancy test, result negative: Secondary | ICD-10-CM | POA: Insufficient documentation

## 2015-08-24 DIAGNOSIS — R102 Pelvic and perineal pain: Secondary | ICD-10-CM | POA: Insufficient documentation

## 2015-08-24 LAB — POCT PREGNANCY, URINE: Preg Test, Ur: NEGATIVE

## 2015-08-24 NOTE — MAU Note (Signed)
Pt reports nausea, vomiting, abd pain, back pain " for a while", states the symptoms are worsening

## 2015-08-24 NOTE — MAU Provider Note (Signed)
History     CSN: [id number]  Arrival date and time: 08/24/15 2325   First Provider Initiated Contact with Patient 08/24/15 2353      Chief Complaint  Patient presents with  . Back Pain  . Abdominal Pain   HPI Comments: Sabrina Harding is a 35 y.o. [id number] who presents today with lower back and abdominal pain. She states that this has been ongoing for >1 month. She was seen in the clinic and has a FU appointment there on 09/15/15. She also has not had a menstrual cycle. She states that she took "two medications", but not produce a menstrual cycle.   Back Pain This is a new problem. The current episode started more than 1 month ago. The problem occurs intermittently. The problem has been gradually worsening since onset. The pain is present in the lumbar spine. The quality of the pain is described as cramping. The pain is at a severity of 8/10. Associated symptoms include abdominal pain. Pertinent negatives include no dysuria or fever. She has tried nothing for the symptoms.  Abdominal Pain This is a new problem. The current episode started more than 1 month ago. The onset quality is gradual. The problem occurs intermittently. The problem has been gradually worsening. The pain is located in the LLQ. The pain is at a severity of 8/10. The quality of the pain is cramping. The abdominal pain radiates to the back. Associated symptoms include nausea and vomiting (3x today ). Pertinent negatives include no constipation, diarrhea, dysuria, fever or frequency. The pain is aggravated by certain positions and movement. The pain is relieved by nothing. Treatments tried: aleve. The treatment provided no relief.    Past Medical History  Diagnosis Date  . Gall stones   . Heart murmur     childhood  . Headache(784.0)   . Anemia     Past Surgical History  Procedure Laterality Date  . Tubal ligation    . Cholecystectomy  10/17/2011  . Tonsillectomy    . Cholecystectomy  10/17/2011    Procedure:  LAPAROSCOPIC CHOLECYSTECTOMY;  Surgeon: Harl Bowie, MD;  Location: [facility] OR;  Service: General;  Laterality: N/A;    Family History  Problem Relation Age of Onset  . Hypertension Father     Social History  Substance Use Topics  . Smoking status: Current Every Day Smoker -- 0.50 packs/day for 5 years    Types: Cigarettes  . Smokeless tobacco: Never Used  . Alcohol Use: Yes     Comment: occasional    Allergies: No Known Allergies  Prescriptions prior to admission  Medication Sig Dispense Refill Last Dose  . aspirin-acetaminophen-caffeine (EXCEDRIN MIGRAINE) 250-250-65 MG tablet Take 1 tablet by mouth every 6 (six) hours as needed for headache. Reported on 07/17/2015   Not Taking  . cephALEXin (KEFLEX) 500 MG capsule Take 1 capsule (500 mg total) by mouth 4 (four) times daily. (Patient not taking: Reported on 07/17/2015) 28 capsule 0 Not Taking  . ibuprofen (ADVIL,MOTRIN) 600 MG tablet Take 1 tablet (600 mg total) by mouth every 6 (six) hours as needed. 30 tablet 1 Taking  . medroxyPROGESTERone (PROVERA) 10 MG tablet Take 1 tablet (10 mg total) by mouth daily. Use for ten days (Patient not taking: Reported on 07/17/2015) 10 tablet 0 Not Taking  . megestrol (MEGACE) 40 MG tablet Take 1 tablet (40 mg total) by mouth 2 (two) times daily. 60 tablet 5   . oxyCODONE-acetaminophen (PERCOCET/ROXICET) 5-325 MG tablet Take 2 tablets by mouth  every 4 (four) hours as needed for severe pain. 15 tablet 0 Taking  . promethazine (PHENERGAN) 25 MG tablet Take 1 tablet (25 mg total) by mouth every 6 (six) hours as needed for nausea or vomiting. 30 tablet 0 Taking    Review of Systems  Constitutional: Negative for fever and chills.  Gastrointestinal: Positive for nausea, vomiting (3x today ) and abdominal pain. Negative for diarrhea and constipation.  Genitourinary: Negative for dysuria, urgency and frequency.  Musculoskeletal: Positive for back pain.   Physical Exam   Blood pressure 125/82, pulse  86, temperature 98.5 F (36.9 C), temperature source Oral, resp. rate 17, height 5\' 4"  (1.626 m), weight 121.11 kg (267 lb), SpO2 100 %.  Physical Exam  Nursing note and vitals reviewed. Constitutional: She is oriented to person, place, and time. She appears well-developed and well-nourished. No distress.  HENT:  Head: Normocephalic.  Cardiovascular: Normal rate.   Respiratory: Effort normal.  GI: Soft. There is no tenderness. There is no rebound.  Musculoskeletal: Normal range of motion.  Neurological: She is alert and oriented to person, place, and time.  Skin: Skin is warm and dry.  Psychiatric: She has a normal mood and affect.   Results for orders placed or performed during the hospital encounter of 08/24/15 (from the past 24 hour(s))  Urinalysis, Routine w reflex microscopic (not at [facility])     Status: Abnormal   Collection Time: 08/24/15 11:35 PM  Result Value Ref Range   Color, Urine YELLOW YELLOW   APPearance CLEAR CLEAR   Specific Gravity, Urine 1.020 1.005 - 1.030   pH 6.0 5.0 - 8.0   Glucose, UA NEGATIVE NEGATIVE mg/dL   Hgb urine dipstick NEGATIVE NEGATIVE   Bilirubin Urine NEGATIVE NEGATIVE   Ketones, ur NEGATIVE NEGATIVE mg/dL   Protein, ur NEGATIVE NEGATIVE mg/dL   Nitrite NEGATIVE NEGATIVE   Leukocytes, UA MODERATE (A) NEGATIVE  Urine microscopic-add on     Status: Abnormal   Collection Time: 08/24/15 11:35 PM  Result Value Ref Range   Squamous Epithelial / LPF 0-5 (A) NONE SEEN   WBC, UA 6-30 0 - 5 WBC/hpf   RBC / HPF 0-5 0 - 5 RBC/hpf   Bacteria, UA FEW (A) NONE SEEN   Urine-Other TRICHOMONAS PRESENT   Pregnancy, urine POC     Status: None   Collection Time: 08/24/15 11:46 PM  Result Value Ref Range   Preg Test, Ur NEGATIVE NEGATIVE  CBC     Status: Abnormal   Collection Time: 08/25/15 12:09 AM  Result Value Ref Range   WBC 9.1 4.0 - 10.5 K/uL   RBC 4.93 3.87 - 5.11 MIL/uL   Hemoglobin 12.1 12.0 - 15.0 g/dL   HCT 36.9 36.0 - 46.0 %   MCV 74.8 (L)  78.0 - 100.0 fL   MCH 24.5 (L) 26.0 - 34.0 pg   MCHC 32.8 30.0 - 36.0 g/dL   RDW 15.5 11.5 - 15.5 %   Platelets 344 150 - 400 K/uL  Comprehensive metabolic panel     Status: Abnormal   Collection Time: 08/25/15 12:09 AM  Result Value Ref Range   Sodium 138 135 - 145 mmol/L   Potassium 4.0 3.5 - 5.1 mmol/L   Chloride 102 101 - 111 mmol/L   CO2 30 22 - 32 mmol/L   Glucose, Bld 105 (H) 65 - 99 mg/dL   BUN 15 6 - 20 mg/dL   Creatinine, Ser 0.98 0.44 - 1.00 mg/dL   Calcium 9.2 8.9 -  10.3 mg/dL   Total Protein 7.5 6.5 - 8.1 g/dL   Albumin 3.6 3.5 - 5.0 g/dL   AST 16 15 - 41 U/L   ALT 16 14 - 54 U/L   Alkaline Phosphatase 80 38 - 126 U/L   Total Bilirubin 0.2 (L) 0.3 - 1.2 mg/dL   GFR calc non Af Amer >60 >60 mL/min   GFR calc Af Amer >60 >60 mL/min   Anion gap 6 5 - 15  Amylase     Status: None   Collection Time: 08/25/15 12:09 AM  Result Value Ref Range   Amylase 85 28 - 100 U/L  Lipase, blood     Status: None   Collection Time: 08/25/15 12:09 AM  Result Value Ref Range   Lipase 17 11 - 51 U/L    MAU Course  Procedures  MDM Treated in MAU with 2g flagyl  Assessment and Plan   1. Trichomoniasis   2. Pelvic pain in female    DC home Comfort measures reviewed  UC, GC/CT pending  Patient declines RX for partner  RX: none  Return to MAU as needed FU with OB as planned  Follow-up Information    Follow up with [facility].   Specialty:  Obstetrics and Gynecology   Why:  As scheduled   Contact information:   [place] [phone]        Mathis Bud 08/24/2015, 11:55 PM

## 2015-08-25 DIAGNOSIS — A599 Trichomoniasis, unspecified: Secondary | ICD-10-CM

## 2015-08-25 LAB — URINE MICROSCOPIC-ADD ON

## 2015-08-25 LAB — COMPREHENSIVE METABOLIC PANEL
ALBUMIN: 3.6 g/dL (ref 3.5–5.0)
ALT: 16 U/L (ref 14–54)
ANION GAP: 6 (ref 5–15)
AST: 16 U/L (ref 15–41)
Alkaline Phosphatase: 80 U/L (ref 38–126)
BUN: 15 mg/dL (ref 6–20)
CHLORIDE: 102 mmol/L (ref 101–111)
CO2: 30 mmol/L (ref 22–32)
Calcium: 9.2 mg/dL (ref 8.9–10.3)
Creatinine, Ser: 0.98 mg/dL (ref 0.44–1.00)
GFR calc Af Amer: >60 mL/min (ref >60)
GFR calc non Af Amer: >60 mL/min (ref >60)
GLUCOSE: 105 mg/dL — AB (ref 65–99)
POTASSIUM: 4 mmol/L (ref 3.5–5.1)
SODIUM: 138 mmol/L (ref 135–145)
TOTAL PROTEIN: 7.5 g/dL (ref 6.5–8.1)
Total Bilirubin: 0.2 mg/dL — ABNORMAL LOW (ref 0.3–1.2)

## 2015-08-25 LAB — CBC
HCT: 36.9 % (ref 36.0–46.0)
Hemoglobin: 12.1 g/dL (ref 12.0–15.0)
MCH: 24.5 pg — ABNORMAL LOW (ref 26.0–34.0)
MCHC: 32.8 g/dL (ref 30.0–36.0)
MCV: 74.8 fL — ABNORMAL LOW (ref 78.0–100.0)
PLATELETS: 344 10*3/uL (ref 150–400)
RBC: 4.93 MIL/uL (ref 3.87–5.11)
RDW: 15.5 % (ref 11.5–15.5)
WBC: 9.1 10*3/uL (ref 4.0–10.5)

## 2015-08-25 LAB — URINALYSIS, ROUTINE W REFLEX MICROSCOPIC
Bilirubin Urine: NEGATIVE
Glucose, UA: NEGATIVE mg/dL
Hgb urine dipstick: NEGATIVE
KETONES UR: NEGATIVE mg/dL
NITRITE: NEGATIVE
PH: 6 (ref 5.0–8.0)
Protein, ur: NEGATIVE mg/dL
Specific Gravity, Urine: 1.02 (ref 1.005–1.030)

## 2015-08-25 LAB — AMYLASE: Amylase: 85 U/L (ref 28–100)

## 2015-08-25 LAB — GC/CHLAMYDIA PROBE AMP (~~LOC~~) NOT AT ARMC
CHLAMYDIA, DNA PROBE: NEGATIVE
NEISSERIA GONORRHEA: NEGATIVE

## 2015-08-25 LAB — LIPASE, BLOOD: Lipase: 17 U/L (ref 11–51)

## 2015-08-25 MED ORDER — KETOROLAC TROMETHAMINE 60 MG/2ML IM SOLN
60.0000 mg | Freq: Once | INTRAMUSCULAR | Status: AC
  Administered 2015-08-25: 60 mg via INTRAMUSCULAR
  Filled 2015-08-25: qty 2

## 2015-08-25 MED ORDER — METRONIDAZOLE 500 MG PO TABS
2000.0000 mg | ORAL_TABLET | Freq: Once | ORAL | Status: AC
  Administered 2015-08-25: 2000 mg via ORAL
  Filled 2015-08-25: qty 4

## 2015-08-25 NOTE — Discharge Instructions (Signed)
Trichomoniasis Trichomoniasis is an infection caused by an organism called Trichomonas. The infection can affect both women and men. In women, the outer female genitalia and the vagina are affected. In men, the penis is mainly affected, but the prostate and other reproductive organs can also be involved. Trichomoniasis is a sexually transmitted infection (STI) and is most often passed to another person through sexual contact.  RISK FACTORS  Having unprotected sexual intercourse.  Having sexual intercourse with an infected partner. SIGNS AND SYMPTOMS  Symptoms of trichomoniasis in women include:  Abnormal gray-green frothy vaginal discharge.  Itching and irritation of the vagina.  Itching and irritation of the area outside the vagina. Symptoms of trichomoniasis in men include:   Penile discharge with or without pain.  Pain during urination. This results from inflammation of the urethra. DIAGNOSIS  Trichomoniasis may be found during a Pap test or physical exam. Your health care provider may use one of the following methods to help diagnose this infection:  Testing the pH of the vagina with a test tape.  Using a vaginal swab test that checks for the Trichomonas organism. A test is available that provides results within a few minutes.  Examining a urine sample.  Testing vaginal secretions. Your health care provider may test you for other STIs, including HIV. TREATMENT   You may be given medicine to fight the infection. Women should inform their health care provider if they could be or are pregnant. Some medicines used to treat the infection should not be taken during pregnancy.  Your health care provider may recommend over-the-counter medicines or creams to decrease itching or irritation.  Your sexual partner will need to be treated if infected.  Your health care provider may test you for infection again 3 months after treatment. HOME CARE INSTRUCTIONS   Take medicines only as  directed by your health care provider.  Take over-the-counter medicine for itching or irritation as directed by your health care provider.  Do not have sexual intercourse while you have the infection.  Women should not douche or wear tampons while they have the infection.  Discuss your infection with your partner. Your partner may have gotten the infection from you, or you may have gotten it from your partner.  Have your sex partner get examined and treated if necessary.  Practice safe, informed, and protected sex.  See your health care provider for other STI testing. SEEK MEDICAL CARE IF:   You still have symptoms after you finish your medicine.  You develop abdominal pain.  You have pain when you urinate.  You have bleeding after sexual intercourse.  You develop a rash.  Your medicine makes you sick or makes you throw up (vomit). MAKE SURE YOU:  Understand these instructions.  Will watch your condition.  Will get help right away if you are not doing well or get worse.   This information is not intended to replace advice given to you by your health care provider. Make sure you discuss any questions you have with your health care provider.   Document Released: 09/18/2000 Document Revised: 04/15/2014 Document Reviewed: 01/04/2013 Elsevier Interactive Patient Education 2016 [employer]vier Inc.  

## 2015-08-26 LAB — URINE CULTURE

## 2015-09-15 ENCOUNTER — Encounter: Payer: Self-pay | Admitting: Obstetrics & Gynecology

## 2015-09-15 ENCOUNTER — Ambulatory Visit (INDEPENDENT_AMBULATORY_CARE_PROVIDER_SITE_OTHER): Payer: Self-pay | Admitting: Obstetrics & Gynecology

## 2015-09-15 VITALS — BP 117/80 | HR 89 | Ht 65.0 in | Wt 259.0 lb

## 2015-09-15 DIAGNOSIS — N912 Amenorrhea, unspecified: Secondary | ICD-10-CM

## 2015-09-15 DIAGNOSIS — Z0000 Encounter for general adult medical examination without abnormal findings: Secondary | ICD-10-CM

## 2015-09-15 NOTE — Progress Notes (Signed)
   Subjective:    Patient ID: Sabrina Harding, female    DOB: 1980/04/26, 35 y.o.   MRN: [id number]  HPI  35 yo S AA P3 here for follow up of u/s result. She has had amenorrhea for 2 years (had BTL in the past).   Review of Systems     Objective:   Physical Exam Morbidly obese pleasant BFNAD Limping a little (h/o foot fracture)       Assessment & Plan:  Amenorrhea- check FSH and prolactin Chronic nausea- refer to [facility]

## 2015-09-16 LAB — FOLLICLE STIMULATING HORMONE: FSH: 42.4 m[IU]/mL

## 2015-09-16 LAB — PROLACTIN: Prolactin: 6.8 ng/mL

## 2015-12-03 ENCOUNTER — Emergency Department (HOSPITAL_COMMUNITY)
Admission: EM | Admit: 2015-12-03 | Discharge: 2015-12-03 | Disposition: A | Discharge status: Home / Self Care | Payer: Self-pay | Attending: Emergency Medicine | Admitting: Emergency Medicine

## 2015-12-03 ENCOUNTER — Emergency Department (HOSPITAL_COMMUNITY): Payer: Self-pay

## 2015-12-03 ENCOUNTER — Encounter (HOSPITAL_COMMUNITY): Payer: Self-pay | Admitting: Emergency Medicine

## 2015-12-03 DIAGNOSIS — M25471 Effusion, right ankle: Secondary | ICD-10-CM | POA: Insufficient documentation

## 2015-12-03 DIAGNOSIS — Z7982 Long term (current) use of aspirin: Secondary | ICD-10-CM | POA: Insufficient documentation

## 2015-12-03 DIAGNOSIS — Z87891 Personal history of nicotine dependence: Secondary | ICD-10-CM | POA: Insufficient documentation

## 2015-12-03 NOTE — ED Triage Notes (Signed)
Pt from home with mild swelling in her right ankle. Pt states she had a fracture in the ankle in March because she fell down stairs. Pt states this injury was treated. Pt states that she has had swelling x 3 days. Pt is able to ambulate, the area is not warm to the touch, and pt states that she has had no exertion more than normal. Pt describes the pain as a sharp ache. Pt has full range of motion

## 2015-12-03 NOTE — Discharge Instructions (Signed)
There were no abnormalities on your x-ray. You have been scheduled for an ultrasound evaluation of your leg to check for blood clots. Please proceed to the [facility] admissions desk tomorrow morning at 8 AM, as instructed on this discharge summary.

## 2015-12-03 NOTE — ED Provider Notes (Signed)
[facility] DEPT Provider Note   CSN: [id number] Arrival date & time: 12/03/15  1836  By signing my name below, I, Sabrina Harding, attest that this documentation has been prepared under the direction and in the presence of Sabrina Ron, PA-C. Electronically Signed: Hansel Harding, ED Scribe. 12/03/15. 7:42 PM.    History   Chief Complaint Chief Complaint  Patient presents with  . Ankle Pain    HPI Sabrina Harding is a 35 y.o. female who presents to the Emergency Department complaining of moderate, gradually increasing right ankle swelling onset 3 days ago with associated right ankle pain. Pt states her swelling is worsened with prolonged periods of standing. She states her pain is worsened with movement and touch. Pt is ambulatory without difficulty. She reports h/o right ankle fracture to the right ankle in 06/2015, but denies any recent surgery to the ankle. No h/o PE/DVT. Pt states she recently traveled to [place], but notes no other long travel. She denies CP, SOB, neuro deficits, or any other complaints.   The history is provided by the patient. No language interpreter was used.    Past Medical History:  Diagnosis Date  . Anemia   . Gall stones   . Headache(784.0)   . Heart murmur    childhood    There are no active problems to display for this patient.   Past Surgical History:  Procedure Laterality Date  . CHOLECYSTECTOMY  10/17/2011  . CHOLECYSTECTOMY  10/17/2011   Procedure: LAPAROSCOPIC CHOLECYSTECTOMY;  Surgeon: Harl Bowie, MD;  Location: [facility];  Service: General;  Laterality: N/A;  . TONSILLECTOMY    . TUBAL LIGATION      OB History    Gravida Para Term Preterm AB Living   6 3 3     3    SAB TAB Ectopic Multiple Live Births           3       Home Medications    Prior to Admission medications   Medication Sig Start Date End Date Taking? Authorizing Provider  aspirin-acetaminophen-caffeine (EXCEDRIN MIGRAINE) [phone] MG tablet Take 1 tablet by  mouth every 6 (six) hours as needed for headache. Reported on 07/17/2015    Historical Provider, MD  ibuprofen (ADVIL,MOTRIN) 600 MG tablet Take 1 tablet (600 mg total) by mouth every 6 (six) hours as needed. 06/23/15   [name], CNM  megestrol (MEGACE) 40 MG tablet Take 1 tablet (40 mg total) by mouth 2 (two) times daily. 07/17/15   [name], MD  oxyCODONE-acetaminophen (PERCOCET/ROXICET) 5-325 MG tablet Take 2 tablets by mouth every 4 (four) hours as needed for severe pain. 07/09/15   [name], PA-C  promethazine (PHENERGAN) 25 MG tablet Take 1 tablet (25 mg total) by mouth every 6 (six) hours as needed for nausea or vomiting. 06/23/15   [name], CNM    Family History Family History  Problem Relation Age of Onset  . Hypertension Father     Social History Social History  Substance Use Topics  . Smoking status: Former Smoker    Packs/day: 0.50    Years: 5.00    Types: Cigarettes  . Smokeless tobacco: Never Used  . Alcohol use 0.0 oz/week     Comment: occasional     Allergies   Review of patient's allergies indicates no known allergies.   Review of Systems Review of Systems  Respiratory: Negative for shortness of breath.   Cardiovascular: Negative for chest pain.  Musculoskeletal: Positive for arthralgias  and joint swelling.    Physical Exam Updated Vital Signs BP 122/78 (BP Location: Right Arm)   Pulse 87   Temp 98.4 F (36.9 C) (Oral)   Resp 15   Ht 5\' 5"  (1.651 m)   Wt 254 lb (115.2 kg)   SpO2 98%   BMI 42.27 kg/m   Physical Exam  Constitutional: She appears well-developed and well-nourished. No distress.  HENT:  Head: Normocephalic and atraumatic.  Eyes: Conjunctivae are normal.  Neck: Neck supple.  Cardiovascular: Normal rate and regular rhythm.   DP 2+ on the right.   Pulmonary/Chest: Effort normal. No respiratory distress.  Abdominal: She exhibits no distension.  Musculoskeletal: Normal range of motion. She exhibits edema and tenderness.    Some swelling and tenderness to the entire right ankle. Tenderness extends into the posterior distal calf. FROM in the right ankle. Weight bearing intact.   Neurological: She is alert.  No sensory deficits. Strength 5/5 to the RLE.   Skin: Skin is warm and dry. She is not diaphoretic.  Psychiatric: She has a normal mood and affect. Her behavior is normal.  Nursing note and vitals reviewed.   ED Treatments / Results  Labs (all labs ordered are listed, but only abnormal results are displayed) Labs Reviewed - No data to display  EKG  EKG Interpretation None       Radiology Dg Ankle Complete Right  Result Date: 12/03/2015 CLINICAL DATA:  Having pain and increased swelling around entire right ankle for the last 3 days. No injury. EXAM: RIGHT ANKLE - COMPLETE 3+ VIEW COMPARISON:  07/09/2015 FINDINGS: Tiny avulsion fragments distal to the medial malleolus seen on the previous study are again noted. Spur or heterotopic ossification along the medial malleolus is new in the interval likely related to associated prior avulsion injury. Ankle mortise is preserved today. No evidence for an acute fracture. No subluxation. Soft tissue swelling is evident. IMPRESSION: No acute bony findings with soft tissue swelling noted. Electronically Signed   By: Misty Stanley M.D.   On: 12/03/2015 20:04    Procedures Procedures (including critical care time)  DIAGNOSTIC STUDIES: Oxygen Saturation is 98% on RA, normal by my interpretation.    COORDINATION OF CARE: 7:40 PM Discussed treatment plan with pt at bedside which includes XR and pt agreed to plan.    Medications Ordered in ED Medications - No data to display   Initial Impression / Assessment and Plan / ED Course  I have reviewed the triage vital signs and the nursing notes.  Pertinent labs & imaging results that were available during my care of the patient were reviewed by me and considered in my medical decision making (see chart for  details).  Clinical Course   Patient with right ankle swelling pain beginning 3 days ago. No abnormalities on x-ray. Patient to receive outpatient duplex ultrasound to check for DVT patient is low risk for DVT. Instructions were communicated with the patient. The patient was also given instructions for home care as well as return precautions. Patient voices understanding of these instructions, accepts the plan, and is comfortable with discharge.  Final Clinical Impressions(s) / ED Diagnoses   Final diagnoses:  Ankle swelling, right    New Prescriptions New Prescriptions   No medications on file   I personally performed the services described in this documentation, which was scribed in my presence. The recorded information has been reviewed and is accurate.    Lorayne Bender, PA-C 12/03/15 2025    Annie Main  Wilson Singer, MD 12/14/15 [phone]

## 2015-12-04 ENCOUNTER — Ambulatory Visit (HOSPITAL_COMMUNITY): Admission: RE | Admit: 2015-12-04 | Payer: Self-pay | Source: Ambulatory Visit

## 2016-01-03 ENCOUNTER — Encounter (HOSPITAL_COMMUNITY): Payer: Self-pay | Admitting: *Deleted

## 2016-01-03 ENCOUNTER — Inpatient Hospital Stay (HOSPITAL_COMMUNITY)
Admission: AD | Admit: 2016-01-03 | Discharge: 2016-01-03 | Disposition: A | Discharge status: Home / Self Care | Payer: Self-pay | Source: Ambulatory Visit | Attending: Family Medicine | Admitting: Family Medicine

## 2016-01-03 DIAGNOSIS — N760 Acute vaginitis: Secondary | ICD-10-CM | POA: Insufficient documentation

## 2016-01-03 DIAGNOSIS — N3001 Acute cystitis with hematuria: Secondary | ICD-10-CM | POA: Insufficient documentation

## 2016-01-03 DIAGNOSIS — B9689 Other specified bacterial agents as the cause of diseases classified elsewhere: Secondary | ICD-10-CM | POA: Insufficient documentation

## 2016-01-03 DIAGNOSIS — A499 Bacterial infection, unspecified: Secondary | ICD-10-CM

## 2016-01-03 DIAGNOSIS — Z87891 Personal history of nicotine dependence: Secondary | ICD-10-CM | POA: Insufficient documentation

## 2016-01-03 DIAGNOSIS — Z7982 Long term (current) use of aspirin: Secondary | ICD-10-CM | POA: Insufficient documentation

## 2016-01-03 HISTORY — DX: Contact with and (suspected) exposure to infections with a predominantly sexual mode of transmission: Z20.2

## 2016-01-03 HISTORY — DX: Gonococcal infection, unspecified: A54.9

## 2016-01-03 LAB — WET PREP, GENITAL
SPERM: NONE SEEN
Trich, Wet Prep: NONE SEEN
Yeast Wet Prep HPF POC: NONE SEEN

## 2016-01-03 LAB — URINALYSIS, ROUTINE W REFLEX MICROSCOPIC
BILIRUBIN URINE: NEGATIVE
Glucose, UA: NEGATIVE mg/dL
KETONES UR: NEGATIVE mg/dL
NITRITE: POSITIVE — AB
PROTEIN: 30 mg/dL — AB
Specific Gravity, Urine: 1.025 (ref 1.005–1.030)
pH: 5.5 (ref 5.0–8.0)

## 2016-01-03 LAB — POCT PREGNANCY, URINE: PREG TEST UR: NEGATIVE

## 2016-01-03 LAB — URINE MICROSCOPIC-ADD ON: RBC / HPF: NONE SEEN RBC/hpf (ref 0–5)

## 2016-01-03 MED ORDER — SULFAMETHOXAZOLE-TRIMETHOPRIM 800-160 MG PO TABS: 1.0000 | ORAL_TABLET | Freq: Two times a day (BID) | ORAL | 0 refills | Status: DC

## 2016-01-03 MED ORDER — METRONIDAZOLE 500 MG PO TABS: 500.0000 mg | ORAL_TABLET | Freq: Two times a day (BID) | ORAL | 0 refills | Status: DC

## 2016-01-03 NOTE — Discharge Instructions (Signed)
Urinary Tract Infection Urinary tract infections (UTIs) can develop anywhere along your urinary tract. Your urinary tract is your body's drainage system for removing wastes and extra water. Your urinary tract includes two kidneys, two ureters, a bladder, and a urethra. Your kidneys are a pair of bean-shaped organs. Each kidney is about the size of your fist. They are located below your ribs, one on each side of your spine. CAUSES Infections are caused by microbes, which are microscopic organisms, including fungi, viruses, and bacteria. These organisms are so small that they can only be seen through a microscope. Bacteria are the microbes that most commonly cause UTIs. SYMPTOMS  Symptoms of UTIs may vary by age and gender of the patient and by the location of the infection. Symptoms in young women typically include a frequent and intense urge to urinate and a painful, burning feeling in the bladder or urethra during urination. Older women and men are more likely to be tired, shaky, and weak and have muscle aches and abdominal pain. A fever may mean the infection is in your kidneys. Other symptoms of a kidney infection include pain in your back or sides below the ribs, nausea, and vomiting. DIAGNOSIS To diagnose a UTI, your caregiver will ask you about your symptoms. Your caregiver will also ask you to provide a urine sample. The urine sample will be tested for bacteria and white blood cells. White blood cells are made by your body to help fight infection. TREATMENT  Typically, UTIs can be treated with medication. Because most UTIs are caused by a bacterial infection, they usually can be treated with the use of antibiotics. The choice of antibiotic and length of treatment depend on your symptoms and the type of bacteria causing your infection. HOME CARE INSTRUCTIONS  If you were prescribed antibiotics, take them exactly as your caregiver instructs you. Finish the medication even if you feel better after  you have only taken some of the medication.  Drink enough water and fluids to keep your urine clear or pale yellow.  Avoid caffeine, tea, and carbonated beverages. They tend to irritate your bladder.  Empty your bladder often. Avoid holding urine for long periods of time.  Empty your bladder before and after sexual intercourse.  After a bowel movement, women should cleanse from front to back. Use each tissue only once. SEEK MEDICAL CARE IF:   You have back pain.  You develop a fever.  Your symptoms do not begin to resolve within 3 days. SEEK IMMEDIATE MEDICAL CARE IF:   You have severe back pain or lower abdominal pain.  You develop chills.  You have nausea or vomiting.  You have continued burning or discomfort with urination. MAKE SURE YOU:   Understand these instructions.  Will watch your condition.  Will get help right away if you are not doing well or get worse.   This information is not intended to replace advice given to you by your health care provider. Make sure you discuss any questions you have with your health care provider.   Document Released: 01/02/2005 Document Revised: 12/14/2014 Document Reviewed: 05/03/2011 Elsevier Interactive Patient Education 2016 Elsevier Inc.     Bacterial Vaginosis Bacterial vaginosis is a vaginal infection that occurs when the normal balance of bacteria in the vagina is disrupted. It results from an overgrowth of certain bacteria. This is the most common vaginal infection in women of childbearing age. Treatment is important to prevent complications, especially in pregnant women, as it can cause a premature  delivery. CAUSES  Bacterial vaginosis is caused by an increase in harmful bacteria that are normally present in smaller amounts in the vagina. Several different kinds of bacteria can cause bacterial vaginosis. However, the reason that the condition develops is not fully understood. RISK FACTORS Certain activities or  behaviors can put you at an increased risk of developing bacterial vaginosis, including:  Having a new sex partner or multiple sex partners.  Douching.  Using an intrauterine device (IUD) for contraception. Women do not get bacterial vaginosis from toilet seats, bedding, swimming pools, or contact with objects around them. SIGNS AND SYMPTOMS  Some women with bacterial vaginosis have no signs or symptoms. Common symptoms include:  Grey vaginal discharge.  A fishlike odor with discharge, especially after sexual intercourse.  Itching or burning of the vagina and vulva.  Burning or pain with urination. DIAGNOSIS  Your health care provider will take a medical history and examine the vagina for signs of bacterial vaginosis. A sample of vaginal fluid may be taken. Your health care provider will look at this sample under a microscope to check for bacteria and abnormal cells. A vaginal pH test may also be done.  TREATMENT  Bacterial vaginosis may be treated with antibiotic medicines. These may be given in the form of a pill or a vaginal cream. A second round of antibiotics may be prescribed if the condition comes back after treatment. Because bacterial vaginosis increases your risk for sexually transmitted diseases, getting treated can help reduce your risk for chlamydia, gonorrhea, HIV, and herpes. HOME CARE INSTRUCTIONS   Only take over-the-counter or prescription medicines as directed by your health care provider.  If antibiotic medicine was prescribed, take it as directed. Make sure you finish it even if you start to feel better.  During treatment, it is important that you follow these instructions:  Avoid sexual activity or use condoms correctly.  Do not douche.  Avoid alcohol as directed by your health care provider.  Avoid breastfeeding as directed by your health care provider. SEEK MEDICAL CARE IF:   Your symptoms are not improving after 3 days of treatment.  You have  increased discharge or pain.  You have a fever. MAKE SURE YOU:   Understand these instructions.  Will watch your condition.  Will get help right away if you are not doing well or get worse. FOR MORE INFORMATION  Centers for Disease Control and Prevention, Division of STD Prevention: [employer] American Sexual Health Association (ASHA): www.ashastd.org    This information is not intended to replace advice given to you by your health care provider. Make sure you discuss any questions you have with your health care provider.   Document Released: 03/25/2005 Document Revised: 04/15/2014 Document Reviewed: 11/04/2012 Elsevier Interactive Patient Education [employer].

## 2016-01-03 NOTE — MAU Provider Note (Signed)
History     CSN: [id number]  Arrival date and time: 01/03/16 [id number]   First Provider Initiated Contact with Patient 01/03/16 1043        Chief Complaint  Patient presents with  . urinary pressure   Sabrina Harding is a 35 y.o. Female who presents with urinary complaints. Denies dysuria. Reports urinary urgency, increased frequency, and lower abdominal pressure. States she feels like she can't empty her bladder.  Is sexually active. States she had negative STI testing last month & doesn't want testing today.    Urinary Tract Infection   This is a new problem. The current episode started yesterday. The problem occurs every urination. The problem has been gradually worsening. The patient is experiencing no pain. There has been no fever. She is sexually active. There is no history of pyelonephritis. Associated symptoms include frequency, hesitancy and urgency. Pertinent negatives include no chills, discharge, flank pain, hematuria, nausea, possible pregnancy or vomiting. She has tried nothing for the symptoms. The treatment provided no relief. There is no history of catheterization, kidney stones, recurrent UTIs, a single kidney or a urological procedure.    OB History    Gravida Para Term Preterm AB Living   6 0 0   3 3   SAB TAB Ectopic Multiple Live Births   3       3      Past Medical History:  Diagnosis Date  . Anemia   . Gall stones   . Gonorrhea   . Headache(784.0)   . Heart murmur    childhood  . Trichomonas contact     Past Surgical History:  Procedure Laterality Date  . CHOLECYSTECTOMY  10/17/2011   Procedure: LAPAROSCOPIC CHOLECYSTECTOMY;  Surgeon: Harl Bowie, MD;  Location: [place];  Service: General;  Laterality: N/A;  . TONSILLECTOMY    . TUBAL LIGATION      Family History  Problem Relation Age of Onset  . Hypertension Father     Social History  Substance Use Topics  . Smoking status: Former Smoker    Packs/day: 0.50    Years: 5.00   Types: Cigarettes  . Smokeless tobacco: Never Used  . Alcohol use 0.0 oz/week     Comment: occasional    Allergies: No Known Allergies  Prescriptions Prior to Admission  Medication Sig Dispense Refill Last Dose  . ibuprofen (ADVIL,MOTRIN) 200 MG tablet Take 800 mg by mouth every 6 (six) hours as needed for headache.   01/02/2016 at Unknown time  . aspirin-acetaminophen-caffeine (EXCEDRIN MIGRAINE) 250-250-65 MG tablet Take 1 tablet by mouth every 6 (six) hours as needed for headache. Reported on 07/17/2015   Not Taking at Unknown time  . ibuprofen (ADVIL,MOTRIN) 600 MG tablet Take 1 tablet (600 mg total) by mouth every 6 (six) hours as needed. (Patient not taking: Reported on 01/03/2016) 30 tablet 1 Not Taking at Unknown time  . megestrol (MEGACE) 40 MG tablet Take 1 tablet (40 mg total) by mouth 2 (two) times daily. (Patient not taking: Reported on 01/03/2016) 60 tablet 5 Not Taking at Unknown time  . oxyCODONE-acetaminophen (PERCOCET/ROXICET) 5-325 MG tablet Take 2 tablets by mouth every 4 (four) hours as needed for severe pain. (Patient not taking: Reported on 01/03/2016) 15 tablet 0 Not Taking at Unknown time  . promethazine (PHENERGAN) 25 MG tablet Take 1 tablet (25 mg total) by mouth every 6 (six) hours as needed for nausea or vomiting. (Patient not taking: Reported on 01/03/2016) 30 tablet 0 Not Taking  at Unknown time    Review of Systems  Constitutional: Negative.  Negative for chills and fever.  Gastrointestinal: Negative.  Negative for nausea and vomiting.  Genitourinary: Positive for frequency, hesitancy and urgency. Negative for dysuria, flank pain and hematuria.   Physical Exam   Blood pressure 145/92, pulse 76, temperature 98.5 F (36.9 C), temperature source Oral, resp. rate 18.  Physical Exam  Nursing note and vitals reviewed. Constitutional: She is oriented to person, place, and time. She appears well-developed and well-nourished. No distress.  HENT:  Head: Normocephalic  and atraumatic.  Eyes: Conjunctivae are normal. Right eye exhibits no discharge. Left eye exhibits no discharge. No scleral icterus.  Neck: Normal range of motion.  Respiratory: Effort normal. No respiratory distress.  GI: Soft. There is no tenderness. There is no CVA tenderness.  Genitourinary: Cervix exhibits discharge (moderate amount of thin white frothy discharge). Cervix exhibits no motion tenderness and no friability.  Neurological: She is alert and oriented to person, place, and time.  Skin: Skin is warm and dry. She is not diaphoretic.  Psychiatric: She has a normal mood and affect. Her behavior is normal. Judgment and thought content normal.    MAU Course  Procedures Results for orders placed or performed during the hospital encounter of 01/03/16 (from the past 24 hour(s))  Urinalysis, Routine w reflex microscopic (not at [facility])     Status: Abnormal   Collection Time: 01/03/16 10:06 AM  Result Value Ref Range   Color, Urine YELLOW YELLOW   APPearance HAZY (A) CLEAR   Specific Gravity, Urine 1.025 1.005 - 1.030   pH 5.5 5.0 - 8.0   Glucose, UA NEGATIVE NEGATIVE mg/dL   Hgb urine dipstick LARGE (A) NEGATIVE   Bilirubin Urine NEGATIVE NEGATIVE   Ketones, ur NEGATIVE NEGATIVE mg/dL   Protein, ur 30 (A) NEGATIVE mg/dL   Nitrite POSITIVE (A) NEGATIVE   Leukocytes, UA MODERATE (A) NEGATIVE  Urine microscopic-add on     Status: Abnormal   Collection Time: 01/03/16 10:06 AM  Result Value Ref Range   Squamous Epithelial / LPF 0-5 (A) NONE SEEN   WBC, UA TOO NUMEROUS TO COUNT 0 - 5 WBC/hpf   RBC / HPF NONE SEEN 0 - 5 RBC/hpf   Bacteria, UA FEW (A) NONE SEEN  Pregnancy, urine POC     Status: None   Collection Time: 01/03/16 10:25 AM  Result Value Ref Range   Preg Test, Ur NEGATIVE NEGATIVE  Wet prep, genital     Status: Abnormal   Collection Time: 01/03/16 11:25 AM  Result Value Ref Range   Yeast Wet Prep HPF POC NONE SEEN NONE SEEN   Trich, Wet Prep NONE SEEN NONE SEEN    Clue Cells Wet Prep HPF POC PRESENT (A) NONE SEEN   WBC, Wet Prep HPF POC MANY (A) NONE SEEN   Sperm NONE SEEN     MDM UPT negative U/a + nitrites Bladder scan shows 60 ml urine  Assessment and Plan  A: 1. Acute cystitis with hematuria   2. BV (bacterial vaginosis)     P: Discharge home Rx bactrim & flagyl Urine culture & GC/CT pending Discussed reasons to seek further treatment  Jorje Guild 01/03/2016, 10:26 AM

## 2016-01-03 NOTE — MAU Note (Signed)
Pt states she sex on Monday night, started having trouble urinating yesterday, feels like her bladder is full, but she is just trickling urine, has a lot of pressure.  "Feels like something is blocking it."  Denies bleeding or discharge.  No dysuria.

## 2016-01-03 NOTE — MAU Note (Signed)
Bladder scan shows 60 cc's.

## 2016-01-04 LAB — GC/CHLAMYDIA PROBE AMP (~~LOC~~) NOT AT ARMC
Chlamydia: NEGATIVE
Neisseria Gonorrhea: POSITIVE — AB

## 2016-01-05 LAB — URINE CULTURE

## 2016-01-06 ENCOUNTER — Inpatient Hospital Stay (HOSPITAL_COMMUNITY)
Admission: EM | Admit: 2016-01-06 | Discharge: 2016-01-11 | DRG: 690 | Disposition: A | Discharge status: Home / Self Care | Payer: Self-pay | Attending: Internal Medicine | Admitting: Internal Medicine

## 2016-01-06 ENCOUNTER — Emergency Department (HOSPITAL_COMMUNITY): Payer: Self-pay

## 2016-01-06 ENCOUNTER — Encounter (HOSPITAL_COMMUNITY): Payer: Self-pay | Admitting: Emergency Medicine

## 2016-01-06 DIAGNOSIS — A549 Gonococcal infection, unspecified: Secondary | ICD-10-CM | POA: Diagnosis present

## 2016-01-06 DIAGNOSIS — D649 Anemia, unspecified: Secondary | ICD-10-CM | POA: Diagnosis present

## 2016-01-06 DIAGNOSIS — R112 Nausea with vomiting, unspecified: Secondary | ICD-10-CM | POA: Diagnosis present

## 2016-01-06 DIAGNOSIS — N941 Unspecified dyspareunia: Secondary | ICD-10-CM | POA: Diagnosis present

## 2016-01-06 DIAGNOSIS — B9620 Unspecified Escherichia coli [E. coli] as the cause of diseases classified elsewhere: Secondary | ICD-10-CM | POA: Diagnosis present

## 2016-01-06 DIAGNOSIS — N12 Tubulo-interstitial nephritis, not specified as acute or chronic: Secondary | ICD-10-CM

## 2016-01-06 DIAGNOSIS — N10 Acute tubulo-interstitial nephritis: Principal | ICD-10-CM | POA: Diagnosis present

## 2016-01-06 DIAGNOSIS — Z833 Family history of diabetes mellitus: Secondary | ICD-10-CM

## 2016-01-06 DIAGNOSIS — B002 Herpesviral gingivostomatitis and pharyngotonsillitis: Secondary | ICD-10-CM | POA: Diagnosis present

## 2016-01-06 DIAGNOSIS — Z79899 Other long term (current) drug therapy: Secondary | ICD-10-CM

## 2016-01-06 DIAGNOSIS — N739 Female pelvic inflammatory disease, unspecified: Secondary | ICD-10-CM | POA: Diagnosis present

## 2016-01-06 DIAGNOSIS — E669 Obesity, unspecified: Secondary | ICD-10-CM | POA: Diagnosis present

## 2016-01-06 DIAGNOSIS — N760 Acute vaginitis: Secondary | ICD-10-CM | POA: Diagnosis present

## 2016-01-06 DIAGNOSIS — F17210 Nicotine dependence, cigarettes, uncomplicated: Secondary | ICD-10-CM | POA: Diagnosis present

## 2016-01-06 DIAGNOSIS — Z6841 Body Mass Index (BMI) 40.0 and over, adult: Secondary | ICD-10-CM

## 2016-01-06 DIAGNOSIS — N730 Acute parametritis and pelvic cellulitis: Secondary | ICD-10-CM

## 2016-01-06 DIAGNOSIS — B009 Herpesviral infection, unspecified: Secondary | ICD-10-CM | POA: Diagnosis present

## 2016-01-06 DIAGNOSIS — R21 Rash and other nonspecific skin eruption: Secondary | ICD-10-CM | POA: Diagnosis present

## 2016-01-06 DIAGNOSIS — Z8249 Family history of ischemic heart disease and other diseases of the circulatory system: Secondary | ICD-10-CM

## 2016-01-06 DIAGNOSIS — K5900 Constipation, unspecified: Secondary | ICD-10-CM | POA: Diagnosis present

## 2016-01-06 LAB — URINALYSIS, ROUTINE W REFLEX MICROSCOPIC
Bilirubin Urine: NEGATIVE
GLUCOSE, UA: NEGATIVE mg/dL
KETONES UR: NEGATIVE mg/dL
Nitrite: POSITIVE — AB
PH: 5.5 (ref 5.0–8.0)
Protein, ur: 30 mg/dL — AB
Specific Gravity, Urine: 1.012 (ref 1.005–1.030)

## 2016-01-06 LAB — WET PREP, GENITAL
Clue Cells Wet Prep HPF POC: NONE SEEN
Sperm: NONE SEEN
Trich, Wet Prep: NONE SEEN
Yeast Wet Prep HPF POC: NONE SEEN

## 2016-01-06 LAB — CBC
HEMATOCRIT: 38.9 % (ref 36.0–46.0)
HEMOGLOBIN: 12.5 g/dL (ref 12.0–15.0)
MCH: 24.6 pg — AB (ref 26.0–34.0)
MCHC: 32.1 g/dL (ref 30.0–36.0)
MCV: 76.4 fL — ABNORMAL LOW (ref 78.0–100.0)
Platelets: 324 10*3/uL (ref 150–400)
RBC: 5.09 MIL/uL (ref 3.87–5.11)
RDW: 14.7 % (ref 11.5–15.5)
WBC: 10.3 10*3/uL (ref 4.0–10.5)

## 2016-01-06 LAB — BASIC METABOLIC PANEL
Anion gap: 10 (ref 5–15)
BUN: 8 mg/dL (ref 6–20)
CHLORIDE: 100 mmol/L — AB (ref 101–111)
CO2: 25 mmol/L (ref 22–32)
CREATININE: 1.07 mg/dL — AB (ref 0.44–1.00)
Calcium: 9.1 mg/dL (ref 8.9–10.3)
GFR calc Af Amer: >60 mL/min (ref >60)
GFR calc non Af Amer: >60 mL/min (ref >60)
GLUCOSE: 142 mg/dL — AB (ref 65–99)
Potassium: 3.6 mmol/L (ref 3.5–5.1)
SODIUM: 135 mmol/L (ref 135–145)

## 2016-01-06 LAB — PREGNANCY, URINE: Preg Test, Ur: NEGATIVE

## 2016-01-06 LAB — URINE MICROSCOPIC-ADD ON

## 2016-01-06 MED ORDER — IOPAMIDOL (ISOVUE-300) INJECTION 61%
INTRAVENOUS | Status: AC
  Filled 2016-01-06: qty 100

## 2016-01-06 MED ORDER — METOCLOPRAMIDE HCL 5 MG/ML IJ SOLN
10.0000 mg | Freq: Once | INTRAMUSCULAR | Status: AC
  Administered 2016-01-06: 10 mg via INTRAVENOUS
  Filled 2016-01-06: qty 2

## 2016-01-06 MED ORDER — SODIUM CHLORIDE 0.9 % IV BOLUS (SEPSIS)
1000.0000 mL | Freq: Once | INTRAVENOUS | Status: AC
  Administered 2016-01-06: 1000 mL via INTRAVENOUS

## 2016-01-06 MED ORDER — ONDANSETRON 4 MG PO TBDP
4.0000 mg | ORAL_TABLET | Freq: Once | ORAL | Status: AC | PRN
  Administered 2016-01-06: 4 mg via ORAL

## 2016-01-06 MED ORDER — AZITHROMYCIN 250 MG PO TABS
1000.0000 mg | ORAL_TABLET | Freq: Once | ORAL | Status: AC
  Administered 2016-01-06: 1000 mg via ORAL
  Filled 2016-01-06: qty 4

## 2016-01-06 MED ORDER — ONDANSETRON 4 MG PO TBDP
ORAL_TABLET | ORAL | Status: AC
  Filled 2016-01-06: qty 1

## 2016-01-06 MED ORDER — DEXTROSE 5 % IV SOLN
1.0000 g | Freq: Once | INTRAVENOUS | Status: AC
  Administered 2016-01-06: 1 g via INTRAVENOUS
  Filled 2016-01-06: qty 10

## 2016-01-06 MED ORDER — KETOROLAC TROMETHAMINE 30 MG/ML IJ SOLN
30.0000 mg | Freq: Once | INTRAMUSCULAR | Status: AC
  Administered 2016-01-06: 30 mg via INTRAVENOUS
  Filled 2016-01-06: qty 1

## 2016-01-06 MED ORDER — CEFTRIAXONE SODIUM 1 G IJ SOLR: 1.0000 g | Freq: Once | INTRAMUSCULAR | Status: DC

## 2016-01-06 MED ORDER — CEPHALEXIN 250 MG PO CAPS
500.0000 mg | ORAL_CAPSULE | Freq: Once | ORAL | Status: AC
  Administered 2016-01-06: 500 mg via ORAL
  Filled 2016-01-06: qty 2

## 2016-01-06 NOTE — ED Provider Notes (Signed)
[facility] DEPT Provider Note   CSN: [id number] Arrival date & time: 01/06/16  1403     History   Chief Complaint Chief Complaint  Patient presents with  . Fever  . Urinary Tract Infection    HPI Sabrina Harding is a 35 y.o. female who was recently diagnosed with UTI 3 days ago who presents with new fever, nausea, vomiting, left flank pain. She has been taking Bactrim as prescribed. Patient reports she has had fever and chills and has had frequent vomiting. She would not specify the frequency. She has also had generalized body aches and headache. Patient denies any chest pain, shortness of breath.Patient has been taking Advil and Tylenol for her symptoms without relief.  HPI  Past Medical History:  Diagnosis Date  . Anemia   . Gall stones   . Gonorrhea   . Headache(784.0)   . Heart murmur    childhood  . Trichomonas contact     Patient Active Problem List   Diagnosis Date Noted  . Pyelonephritis 01/06/2016    Past Surgical History:  Procedure Laterality Date  . CHOLECYSTECTOMY  10/17/2011   Procedure: LAPAROSCOPIC CHOLECYSTECTOMY;  Surgeon: Harl Bowie, MD;  Location: [place];  Service: General;  Laterality: N/A;  . TONSILLECTOMY    . TUBAL LIGATION      OB History    Gravida Para Term Preterm AB Living   6 0 0   3 3   SAB TAB Ectopic Multiple Live Births   3       3       Home Medications    Prior to Admission medications   Medication Sig Start Date End Date Taking? Authorizing Provider  aspirin-acetaminophen-caffeine (EXCEDRIN MIGRAINE) 940-311-0007 MG tablet Take 1 tablet by mouth every 6 (six) hours as needed for headache. Reported on 07/17/2015    Historical Provider, MD  ibuprofen (ADVIL,MOTRIN) 200 MG tablet Take 800 mg by mouth every 6 (six) hours as needed for headache.    Historical Provider, MD  metroNIDAZOLE (FLAGYL) 500 MG tablet Take 1 tablet (500 mg total) by mouth 2 (two) times daily. 01/03/16   Jorje Guild, NP    sulfamethoxazole-trimethoprim (BACTRIM DS,SEPTRA DS) 800-160 MG tablet Take 1 tablet by mouth 2 (two) times daily. 01/03/16   Jorje Guild, NP    Family History Family History  Problem Relation Age of Onset  . Hypertension Father     Social History Social History  Substance Use Topics  . Smoking status: Former Smoker    Packs/day: 0.50    Years: 5.00    Types: Cigarettes  . Smokeless tobacco: Never Used  . Alcohol use 0.0 oz/week     Comment: occasional     Allergies   Review of patient's allergies indicates no known allergies.   Review of Systems Review of Systems  Constitutional: Negative for chills and fever.  HENT: Negative for facial swelling and sore throat.   Respiratory: Negative for shortness of breath.   Cardiovascular: Negative for chest pain.  Gastrointestinal: Positive for abdominal pain. Negative for nausea and vomiting.  Genitourinary: Positive for dysuria and flank pain.  Musculoskeletal: Positive for back pain.  Skin: Negative for rash and wound.  Neurological: Positive for headaches.  Psychiatric/Behavioral: The patient is not nervous/anxious.      Physical Exam Updated Vital Signs BP 112/66   Pulse 85   Temp 98.1 F (36.7 C) (Oral)   Resp 18   SpO2 99%   Physical Exam  Constitutional: She  appears well-developed and well-nourished. No distress.  HENT:  Head: Normocephalic and atraumatic.  Mouth/Throat: Oropharynx is clear and moist. No oropharyngeal exudate.  Eyes: Conjunctivae are normal. Pupils are equal, round, and reactive to light. Right eye exhibits no discharge. Left eye exhibits no discharge. No scleral icterus.  Neck: Normal range of motion. Neck supple. No thyromegaly present.  Cardiovascular: Normal rate, regular rhythm and normal heart sounds.  Exam reveals no gallop and no friction rub.   No murmur heard. Pulmonary/Chest: Effort normal and breath sounds normal. No stridor. No respiratory distress. She has no wheezes. She has  no rales.  Abdominal: Soft. Bowel sounds are normal. She exhibits no distension. There is tenderness in the left lower quadrant. There is CVA tenderness (Left). There is no rebound and no guarding.  Genitourinary: Pelvic exam was performed with patient supine. There is no rash, tenderness or lesion on the right labia. There is no rash, tenderness or lesion on the left labia. Cervix exhibits motion tenderness and discharge. Right adnexum displays tenderness. Left adnexum displays tenderness. There is tenderness in the vagina. No bleeding in the vagina. Vaginal discharge (white, milky) found.  Musculoskeletal: She exhibits no edema.  Lymphadenopathy:    She has no cervical adenopathy.  Neurological: She is alert. Coordination normal.  CN 3-12 intact; normal sensation throughout; 5/5 strength in all 4 extremities; equal bilateral grip strength; no ataxia on finger to nose   Skin: Skin is warm and dry. No rash noted. She is not diaphoretic. No pallor.  Psychiatric: She has a normal mood and affect.  Nursing note and vitals reviewed.    ED Treatments / Results  Labs (all labs ordered are listed, but only abnormal results are displayed) Labs Reviewed  WET PREP, GENITAL - Abnormal; Notable for the following:       Result Value   WBC, Wet Prep HPF POC FEW (*)    All other components within normal limits  URINALYSIS, ROUTINE W REFLEX MICROSCOPIC (NOT AT [facility]) - Abnormal; Notable for the following:    APPearance TURBID (*)    Hgb urine dipstick LARGE (*)    Protein, ur 30 (*)    Nitrite POSITIVE (*)    Leukocytes, UA LARGE (*)    All other components within normal limits  CBC - Abnormal; Notable for the following:    MCV 76.4 (*)    MCH 24.6 (*)    All other components within normal limits  BASIC METABOLIC PANEL - Abnormal; Notable for the following:    Chloride 100 (*)    Glucose, Bld 142 (*)    Creatinine, Ser 1.07 (*)    All other components within normal limits  URINE MICROSCOPIC-ADD  ON - Abnormal; Notable for the following:    Squamous Epithelial / LPF 6-30 (*)    Bacteria, UA FEW (*)    All other components within normal limits  URINE CULTURE  PREGNANCY, URINE  RPR  HIV ANTIBODY (ROUTINE TESTING)  GC/CHLAMYDIA PROBE AMP () NOT AT [facility]    EKG  EKG Interpretation None       Radiology Ct Abdomen Pelvis W Contrast  Result Date: 01/06/2016 CLINICAL DATA:  Low pelvic pain bilaterally but more on the left for 2 days. Left flank pain. EXAM: CT ABDOMEN AND PELVIS WITH CONTRAST TECHNIQUE: Multidetector CT imaging of the abdomen and pelvis was performed using the standard protocol following bolus administration of intravenous contrast. CONTRAST:  100 mL Isovue-300 COMPARISON:  None. FINDINGS: Lower chest: Atelectasis in  the lung bases. Prominent lymph node adjacent to the distal esophagus is not pathologically enlarged and likely reactive. Hepatobiliary: No focal liver abnormality is seen. Status post cholecystectomy. No biliary dilatation. Pancreas: Unremarkable. No pancreatic ductal dilatation or surrounding inflammatory changes. Spleen: Normal in size without focal abnormality. Adrenals/Urinary Tract: No adrenal gland nodules. Renal nephrograms are symmetrical. Subcentimeter parenchymal cysts in both kidneys. No hydronephrosis or hydroureter. Left ureter demonstrates mild wall thickening with stranding around the ureter. The appearance is nonspecific but could indicate pyelonephritis. Correlation for laboratory evidence of pyelonephritis is suggested. No bladder wall thickening. Stomach/Bowel: Stomach and small bowel are decompressed. Colon is diffusely stool-filled without significant distention or wall thickening. The appendix is normal. Vascular/Lymphatic: No significant vascular findings are present. No enlarged abdominal or pelvic lymph nodes. Reproductive: Uterus and bilateral adnexa are unremarkable. Other: No abdominal wall hernia or abnormality. No  abdominopelvic ascites. Musculoskeletal: No acute or significant osseous findings. IMPRESSION: Wall thickening and stranding involving the left ureter may represent changes of pyelonephritis. Correlation with laboratory studies is recommended. No evidence of bowel obstruction or inflammation. Electronically Signed   By: Lucienne Capers M.D.   On: 01/06/2016 21:51    Procedures Procedures (including critical care time)  Medications Ordered in ED Medications  iopamidol (ISOVUE-300) 61 % injection (not administered)  ondansetron (ZOFRAN-ODT) disintegrating tablet 4 mg (4 mg Oral Given 01/06/16 1539)  sodium chloride 0.9 % bolus 1,000 mL (0 mLs Intravenous Stopped 01/06/16 2211)  ketorolac (TORADOL) 30 MG/ML injection 30 mg (30 mg Intravenous Given 01/06/16 1752)  metoCLOPramide (REGLAN) injection 10 mg (10 mg Intravenous Given 01/06/16 1752)  cephALEXin (KEFLEX) capsule 500 mg (500 mg Oral Given 01/06/16 1752)  cefTRIAXone (ROCEPHIN) 1 g in dextrose 5 % 50 mL IVPB (0 g Intravenous Stopped 01/06/16 1822)  azithromycin (ZITHROMAX) tablet 1,000 mg (1,000 mg Oral Given 01/06/16 2210)  sodium chloride 0.9 % bolus 1,000 mL (1,000 mLs Intravenous New Bag/Given 01/06/16 2311)     Initial Impression / Assessment and Plan / ED Course  I have reviewed the triage vital signs and the nursing notes.  Pertinent labs & imaging results that were available during my care of the patient were reviewed by me and considered in my medical decision making (see chart for details).  Clinical Course    Patient with urinary tract infection and symptoms of possible pyelonephritis. Also concern for tubo-ovarian abscess considering positive gonorrhea result from outpatient visit 3 days ago. Will treat with Rocephin, Toradol, Reglan, 1 L fluid bolus. Pelvic exam to determine adnexal tenderness and need for CT scan.  Adnexal tenderness bilaterally, CMT. We'll evaluate for tubo-ovarian abscess and also visualize kidneys with CT  abdomen pelvis.   CBC unremarkable. BMP shows chloride 100, glucose 142, creatinine 1.07. UA shows large hematuria, large leukocytes, few bacteria, too numerous to count WBCs. Urine culture sent. Wet prep shows few WBCs. RPR, HIV sent. Urine pregnancy negative. CT abdomen pelvis shows wall thickening and stranding involving the left ureter presenting changes of pyelonephritis, no evidence of bowel obstruction or inflammation; uterus and bilateral adnexa are unremarkable. Rocephin and azithromycin initiated in the ED. Patient now tolerating by mouth and nausea feeling much better, no active vomiting in the ED. However, patient's pressures continually soft throughout ED course. Manual blood pressure confirmed 82/50. I feel that inpatient treatment is best for this patient considering failure of outpatient and hypotension. I consulted internal medicine and spoke with resident physician, Jule Ser, who will admit the patient under Dr. Beryle Beams for observation  and the stepdown unit. Patient understands and agrees with plan.   Final Clinical Impressions(s) / ED Diagnoses   Final diagnoses:  Pyelonephritis    New Prescriptions New Prescriptions   No medications on file        Frederica Kuster, PA-C 01/06/16 [place], DO 01/06/16 2346

## 2016-01-06 NOTE — H&P (Signed)
Date: 01/07/2016               Patient Name:  Sabrina Harding MRN: [id number]  DOB: 1980-09-18 Age / Sex: 35 y.o., female   PCP: No Pcp Per Patient         Medical Service: Internal Medicine Teaching Service         Attending Physician: Dr. Algis Greenhouse    First Contact: Dr. Velna Ochs Pager: [phone]  Second Contact: Dr. Martyn Malay Pager: [phone]       After Hours (After 5p/  First Contact Pager: [phone]  weekends / holidays): Second Contact Pager: [phone]   Chief Complaint: UTI, fever, N/V  History of Present Illness: Sabrina Harding is a 35 y.o. woman with PMH obesity, anemia, STIs who presents for UTI with fever, flank pain, and nausea/vomiting. Patient noticed dypareunia with partner on 9/25 then developed sensation of urinary obstruction, pain, and lower abdominal pressure on 9/26. She visited OB/GYN on 9/27 for these symptoms and was found to have pain on pelvic exam, white discharge, clue cells on wet prep and urinalysis suggestive of UTI. She was prescribed Flagyl and Bactrim. Urine culture grew E. Coli and gonorrhea resulted positive on 9/28. Since that visit her pain has progressed to left-sided abdomen and left flank. She developed fevers and chills and severe nausea and vomiting approx 2-3 days ago. Has been unable to keep down her medications or food for several days. She reports taking her temperature at home and it was 106. She has been taking Aleve, Ibuprofen, Tylenol at home with minimal relief.  Initial vitals in the ED - afebrile, HR 92, RR 22, 114/60, 97% on RA. Noted to have CVA tenderness (L) as well as cervical motion tenderness and discharge on pelvic exam. She received 2x 1L NS boluses, Zofran, Reglan, Toradol, IV ceftriaxone, po keflex and azithromycin x1. CBC and BMP unremarkable, urine pregnancy negative, U/A with TNTC WBC, 6-30 RBCs, and +Nitrite. CT A/P with contrast showed wall thickening and stranding of the left ureter suggestive of  pyelonephritis.  Meds:  Current Meds  Medication Sig  . aspirin-acetaminophen-caffeine (EXCEDRIN MIGRAINE) 250-250-65 MG tablet Take 1 tablet by mouth every 6 (six) hours as needed for headache. Reported on 07/17/2015  . ibuprofen (ADVIL,MOTRIN) 200 MG tablet Take 800 mg by mouth every 6 (six) hours as needed for headache.  . metroNIDAZOLE (FLAGYL) 500 MG tablet Take 1 tablet (500 mg total) by mouth 2 (two) times daily.    Allergies: Allergies as of 01/06/2016  . (No Known Allergies)   Past Medical History:  Diagnosis Date  . Anemia   . Gall stones   . Gonorrhea   . Headache(784.0)   . Heart murmur    childhood  . Trichomonas contact     Family History:  Diabetes and sickle cell disease in family Family History  Problem Relation Age of Onset  . Hypertension Father    Social History:  Social History   Social History  . Marital status: Single    Spouse name: N/A  . Number of children: 3  . Years of education: N/A   Occupational History  . Not on file.   Social History Main Topics  . Smoking status: Current Every Day Smoker    Packs/day: 0.50    Years: 8.00    Types: Cigarettes  . Smokeless tobacco: Never Used  . Alcohol use 0.0 oz/week     Comment: occasional  . Drug use: No  .  Sexual activity: Yes    Partners: Male    Birth control/ protection: Surgical     Comment: Reports having unprotected sex   Other Topics Concern  . Not on file   Social History Narrative   Lives in [place] at home with her three children and mother. 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    Review of Systems: A complete ROS was negative except as per HPI.   Physical Exam: Blood pressure 118/84, pulse 82, temperature 98.6 F (37 C), resp. rate 16, SpO2 96 %.  General appearance: Obese woman wrapped in blanks laying on ED stretcher, in no distress, alert but shivering, conversational HENT: Normocephalic, atraumatic, moist mucous membranes Eyes: PERRL, EOM intact,  non-icteric Cardiovascular: Regular rate and rhythm, no murmurs, rubs, gallops Respiratory: Clear to auscultation bilaterally, normal work of breathing Back: Normal curvature, no spinal tenderness to palpation, left sided CVA tenderness to percussion Abdomen: Obese, BS+, soft, mild tenderness to palpation on left side, non-distended Extremities: Normal bulk and range of motion, no edema, 2+ peripheral pulses Skin: Warm, dry, intact Neuro: Alert and oriented, cranial nerves grossly intact Psych: Appropriate affect, clear speech, thoughts linear and goal-directed   Assessment & Plan by Problem:  Acute pyelonephritis, left, with concern for superimposed pelvic inflammatory disease. Urine culture positive for cephalosporin-sensitive E.coli and GC probe positive. Diffuse left sided abdominal pain and flank pain with CVA tenderness. Also reports having dyspareunia and has CMT with pelvic exam. Reports high fever at home and developed persistent nausea and vomiting. Imaging shows inflammatory changes of L ureter suggestive of pyelo. S/p 2L NS, 1x ceftriaxone, azithro, keflex in ED.  - Ceftriaxone 1g IV QD - Doxycycline 100mg  po BID - Metronidazole 500mg  po BID  - Tylenol 650mg  po Q6H PRN for pain - Zofran 4mg  po or IV Q6H PRN for nausea - HIV, RPR, repeat GC/Chlamydia probe pending - Follow AM CBC, BMP  FEN/GI: Heart healthy diet, replete electrolytes as needed  DVT ppx: Lovenox  Code status: Full code  Dispo: Admit patient to Observation with expected length of stay less than 2 midnights.  Signed: Asencion Partridge, MD 01/07/2016, 1:59 AM  Pager: [phone]

## 2016-01-06 NOTE — ED Notes (Signed)
Assess pt. In Waiting room, Pt. Has to go to the bathroom, but is unwilling to go to the bathroom.  Offered to help pt. She feels she is unable to stand up to go to the bathroom  .    She informed this RN< that  Might have urinated on herself.  Pt. Is being transferred to a Rm.

## 2016-01-06 NOTE — ED Triage Notes (Signed)
3 days ago dx with UTI; on antibiotics. Not feeling any better; fever last night. N/V since last night. General aches and flank pain.

## 2016-01-06 NOTE — ED Notes (Signed)
Patient transported to CT 

## 2016-01-06 NOTE — ED Notes (Signed)
Pt. Was sitting on the bathroom floor after vomiting all over the toilet.  Assisted her to a wheelchair, helped her clean her self up.  Pt. Given a warm blanket and Zofran 4 mg  ODT>  Pt. Placed near Nurse first .

## 2016-01-07 ENCOUNTER — Encounter (HOSPITAL_COMMUNITY): Payer: Self-pay | Admitting: Internal Medicine

## 2016-01-07 DIAGNOSIS — N739 Female pelvic inflammatory disease, unspecified: Secondary | ICD-10-CM | POA: Diagnosis present

## 2016-01-07 DIAGNOSIS — A549 Gonococcal infection, unspecified: Secondary | ICD-10-CM | POA: Diagnosis present

## 2016-01-07 LAB — BASIC METABOLIC PANEL
Anion gap: 8 (ref 5–15)
BUN: 9 mg/dL (ref 6–20)
CO2: 23 mmol/L (ref 22–32)
Calcium: 8 mg/dL — ABNORMAL LOW (ref 8.9–10.3)
Chloride: 103 mmol/L (ref 101–111)
Creatinine, Ser: 1 mg/dL (ref 0.44–1.00)
GFR calc Af Amer: >60 mL/min (ref >60)
GLUCOSE: 102 mg/dL — AB (ref 65–99)
POTASSIUM: 3.3 mmol/L — AB (ref 3.5–5.1)
Sodium: 134 mmol/L — ABNORMAL LOW (ref 135–145)

## 2016-01-07 LAB — CBC WITH DIFFERENTIAL/PLATELET
Basophils Absolute: 0 10*3/uL (ref 0.0–0.1)
Basophils Relative: 0 %
EOS PCT: 0 %
Eosinophils Absolute: 0 10*3/uL (ref 0.0–0.7)
HCT: 33 % — ABNORMAL LOW (ref 36.0–46.0)
Hemoglobin: 10.5 g/dL — ABNORMAL LOW (ref 12.0–15.0)
LYMPHS ABS: 0.9 10*3/uL (ref 0.7–4.0)
LYMPHS PCT: 9 %
MCH: 23.9 pg — AB (ref 26.0–34.0)
MCHC: 31.8 g/dL (ref 30.0–36.0)
MCV: 75.2 fL — AB (ref 78.0–100.0)
MONO ABS: 1 10*3/uL (ref 0.1–1.0)
MONOS PCT: 11 %
Neutro Abs: 8 10*3/uL — ABNORMAL HIGH (ref 1.7–7.7)
Neutrophils Relative %: 80 %
PLATELETS: 251 10*3/uL (ref 150–400)
RBC: 4.39 MIL/uL (ref 3.87–5.11)
RDW: 14.9 % (ref 11.5–15.5)
WBC: 9.9 10*3/uL (ref 4.0–10.5)

## 2016-01-07 LAB — HIV ANTIBODY (ROUTINE TESTING W REFLEX): HIV Screen 4th Generation wRfx: NONREACTIVE

## 2016-01-07 LAB — RPR: RPR Ser Ql: NONREACTIVE

## 2016-01-07 MED ORDER — ONDANSETRON HCL 4 MG PO TABS
4.0000 mg | ORAL_TABLET | Freq: Four times a day (QID) | ORAL | Status: DC | PRN
  Administered 2016-01-07: 4 mg via ORAL
  Filled 2016-01-07 (×2): qty 1

## 2016-01-07 MED ORDER — METRONIDAZOLE 500 MG PO TABS
500.0000 mg | ORAL_TABLET | Freq: Two times a day (BID) | ORAL | Status: DC
  Administered 2016-01-07 – 2016-01-11 (×9): 500 mg via ORAL
  Filled 2016-01-07 (×9): qty 1

## 2016-01-07 MED ORDER — METRONIDAZOLE IN NACL 5-0.79 MG/ML-% IV SOLN: 500.0000 mg | Freq: Two times a day (BID) | INTRAVENOUS | Status: DC

## 2016-01-07 MED ORDER — ACETAMINOPHEN 325 MG PO TABS
650.0000 mg | ORAL_TABLET | Freq: Four times a day (QID) | ORAL | Status: DC | PRN
Start: 2016-01-07 — End: 2016-01-11
  Administered 2016-01-07 – 2016-01-10 (×3): 650 mg via ORAL
  Filled 2016-01-07 (×4): qty 2

## 2016-01-07 MED ORDER — DEXTROSE 5 % IV SOLN
1.0000 g | INTRAVENOUS | Status: AC
  Administered 2016-01-07 – 2016-01-09 (×3): 1 g via INTRAVENOUS
  Filled 2016-01-07 (×3): qty 10

## 2016-01-07 MED ORDER — SODIUM CHLORIDE 0.9 % IV SOLN
INTRAVENOUS | Status: DC
  Administered 2016-01-07: 05:00:00 via INTRAVENOUS

## 2016-01-07 MED ORDER — ONDANSETRON HCL 4 MG/2ML IJ SOLN: 4.0000 mg | Freq: Three times a day (TID) | INTRAMUSCULAR | Status: DC | PRN

## 2016-01-07 MED ORDER — ONDANSETRON HCL 4 MG/2ML IJ SOLN
4.0000 mg | Freq: Four times a day (QID) | INTRAMUSCULAR | Status: DC | PRN
  Administered 2016-01-07: 4 mg via INTRAVENOUS
  Filled 2016-01-07: qty 2

## 2016-01-07 MED ORDER — MORPHINE SULFATE (PF) 2 MG/ML IV SOLN
1.0000 mg | Freq: Once | INTRAVENOUS | Status: AC
  Administered 2016-01-07: 1 mg via INTRAVENOUS
  Filled 2016-01-07: qty 1

## 2016-01-07 MED ORDER — METRONIDAZOLE 500 MG PO TABS: 500.0000 mg | ORAL_TABLET | Freq: Two times a day (BID) | ORAL | Status: DC

## 2016-01-07 MED ORDER — DEXTROSE 5 % IV SOLN: 1.0000 g | INTRAVENOUS | Status: DC

## 2016-01-07 MED ORDER — DEXTROSE 5 % IV SOLN
100.0000 mg | Freq: Two times a day (BID) | INTRAVENOUS | Status: DC
  Filled 2016-01-07: qty 100

## 2016-01-07 MED ORDER — CEFOTETAN DISODIUM 2 G IJ SOLR: 2.0000 g | Freq: Two times a day (BID) | INTRAMUSCULAR | Status: DC

## 2016-01-07 MED ORDER — ACETAMINOPHEN 650 MG RE SUPP: 650.0000 mg | Freq: Four times a day (QID) | RECTAL | Status: DC | PRN

## 2016-01-07 MED ORDER — OXYCODONE-ACETAMINOPHEN 5-325 MG PO TABS
1.0000 | ORAL_TABLET | Freq: Four times a day (QID) | ORAL | Status: DC | PRN
  Administered 2016-01-07 – 2016-01-08 (×2): 2 via ORAL
  Filled 2016-01-07 (×2): qty 2

## 2016-01-07 MED ORDER — INFLUENZA VAC SPLIT QUAD 0.5 ML IM SUSY
0.5000 mL | PREFILLED_SYRINGE | INTRAMUSCULAR | Status: DC
  Filled 2016-01-07 (×2): qty 0.5

## 2016-01-07 MED ORDER — DOXYCYCLINE HYCLATE 100 MG PO TABS
100.0000 mg | ORAL_TABLET | Freq: Two times a day (BID) | ORAL | Status: DC
  Administered 2016-01-07 – 2016-01-11 (×9): 100 mg via ORAL
  Filled 2016-01-07 (×9): qty 1

## 2016-01-07 MED ORDER — ENOXAPARIN SODIUM 40 MG/0.4ML ~~LOC~~ SOLN
40.0000 mg | SUBCUTANEOUS | Status: DC
  Administered 2016-01-07 – 2016-01-11 (×5): 40 mg via SUBCUTANEOUS
  Filled 2016-01-07 (×5): qty 0.4

## 2016-01-07 MED ORDER — SODIUM CHLORIDE 0.9 % IV SOLN
INTRAVENOUS | Status: DC
  Administered 2016-01-07 – 2016-01-10 (×7): via INTRAVENOUS

## 2016-01-07 MED ORDER — DOXYCYCLINE HYCLATE 100 MG PO TABS: 100.0000 mg | ORAL_TABLET | Freq: Two times a day (BID) | ORAL | Status: DC

## 2016-01-07 NOTE — Progress Notes (Signed)
New Admission Note:   Arrival Method: Via stretcher from the ED Mental Orientation:  A & O x 4 Telemetry: N/A Assessment: Completed Skin:  Dry but intact IV:  Rt AC Pain: c/o headache Tubes:  None Safety Measures: Safety Fall Prevention Plan has been given, discussed and signed Admission: Completed [facility] Orientation: Patient has been orientated to the room, unit and staff.  Family:  Orders have been reviewed and implemented. Will continue to monitor the patient. Call light has been placed within reach and bed alarm has been activated.   Earleen Reaper RN- [place], [place] Phone number: [phone]

## 2016-01-07 NOTE — ED Notes (Signed)
Report attempted on stepdown 2C after pt waited for 13 hours on the ED, RN state she doesn't believe pt is appropriated for stepdown unit, she paged admitting MD and bed got changed to [facility] unit. Pt to wait for reassign bed by bedcontrol.

## 2016-01-07 NOTE — ED Notes (Signed)
Pt states she may urinated on her self while asleep, pt ask to get up from the stretcher so we can clean her self and change the bed, pt refuses to get up and to be change, states she will wait to get to the room and go to bathroom there.

## 2016-01-07 NOTE — Progress Notes (Signed)
   Subjective: Patient laying in bed, NAD. She reports not feeling well and was not very interactive during conversation. She continues to have left sided abdominal and flank pain and reports one episode of emesis. She was febrile to 102.7 overnight. She denies any urinary symptoms and vaginal discharge.   Objective:  Vital signs in last 24 hours: Vitals:   01/07/16 0433 01/07/16 0645 01/07/16 0700 01/07/16 0939  BP: 124/72   (!) 93/55  Pulse: 97   71  Resp: 18   18  Temp: (!) 102.7 F (39.3 C) 98.3 F (36.8 C)  98.3 F (36.8 C)  TempSrc: Oral Oral  Oral  SpO2: 97%   97%  Weight: 255 lb 1.2 oz (115.7 kg)     Height:   5\' 5"  (1.651 m)    Physical Exam Constitutional: NAD, appears comfortable HEENT: Atraumatic, normocephalic. anicteric sclera.  Cardiovascular: RRR, no murmurs, rubs, or gallops.  Pulmonary/Chest: CTAB, no wheezes, rales, or rhonchi. No chest wall abnormalities.  Abdominal: Soft, diffusely tender but worse on left side, non distended. +BS.  Extremities: Warm and well perfused. Distal pulses intact. No edema.  Neurological: A&Ox3, CN II - XII grossly intact.  Skin: No rashes or erythema  Psychiatric: Normal mood and affect  Assessment/Plan:  Acute pyelonephritis & PID: Recent urine culture positive for cephalosporin sensitive E coli and GC probe positive at gynecologist office visit a few days ago. Also clue cells on wet prep consistent with BV. She was prescribed a course of flagyl but has developed N/V and has been unable to keep down her medication.  Patient reports fevers at home with diffuse left sided abdominal and flank pain with CVA tenderness. She endorses dyspareunia. Physical exam notable for cervical motion tenderness per ED physician. UA with positive nitrites and large leukocytes, few bacteria.  Imaging consistent with inflammatory changes of Left ureter suggestive of pyelo. S/p 2L NS, 1 x IV ceftriaxone, azithro, and keflex in ED.   -- Continue ceftriaxone  1g IV daily -- Doxy 100 mg po BID -- Metronidazole 500 mg po BID -- Tylenol 650 mg po Q6 hrs prn for nausea -- HIV, RPR, and repeat GC/Chlamydia probe pending -- Blood cultures pending -- Repeat CBC and BMP in AM  FEN: NS 125 cc/hr, replete lytes prn, heart healthy diet VTE ppx: Lovenox  Code Status: FULL   Dispo: Anticipated discharge in approximately 1-2 day(s).   Velna Ochs, MD 01/07/2016, 11:01 AM Pager: [phone]

## 2016-01-07 NOTE — ED Notes (Signed)
Report attempted 

## 2016-01-07 NOTE — ED Notes (Signed)
Report attempted, RN unable to get report at this time. 

## 2016-01-08 LAB — CBC
HCT: 32.4 % — ABNORMAL LOW (ref 36.0–46.0)
Hemoglobin: 10.2 g/dL — ABNORMAL LOW (ref 12.0–15.0)
MCH: 23.8 pg — ABNORMAL LOW (ref 26.0–34.0)
MCHC: 31.5 g/dL (ref 30.0–36.0)
MCV: 75.5 fL — ABNORMAL LOW (ref 78.0–100.0)
PLATELETS: 257 10*3/uL (ref 150–400)
RBC: 4.29 MIL/uL (ref 3.87–5.11)
RDW: 15.3 % (ref 11.5–15.5)
WBC: 4.8 10*3/uL (ref 4.0–10.5)

## 2016-01-08 LAB — URINE CULTURE: Culture: >=100000 — AB

## 2016-01-08 LAB — GC/CHLAMYDIA PROBE AMP (~~LOC~~) NOT AT ARMC
Chlamydia: NEGATIVE
Neisseria Gonorrhea: POSITIVE — AB

## 2016-01-08 MED ORDER — PROMETHAZINE HCL 25 MG PO TABS
25.0000 mg | ORAL_TABLET | Freq: Four times a day (QID) | ORAL | Status: DC | PRN
  Administered 2016-01-08 – 2016-01-10 (×4): 25 mg via ORAL
  Filled 2016-01-08 (×5): qty 1

## 2016-01-08 MED ORDER — OXYCODONE-ACETAMINOPHEN 5-325 MG PO TABS
1.0000 | ORAL_TABLET | Freq: Four times a day (QID) | ORAL | Status: AC | PRN
  Administered 2016-01-08 – 2016-01-09 (×3): 2 via ORAL
  Filled 2016-01-08 (×3): qty 2

## 2016-01-08 MED ORDER — SODIUM CHLORIDE 0.9 % IV BOLUS (SEPSIS)
1000.0000 mL | Freq: Once | INTRAVENOUS | Status: AC
  Administered 2016-01-08: 1000 mL via INTRAVENOUS

## 2016-01-08 NOTE — Progress Notes (Signed)
At beginning of shift, patient stated her left flank and lower abdominal pain was 8/10.  She also complained of moderate nausea.  She had received IV morphine earlier in the day.  She stated that the morphine had never eased her pain and that it felt like she had never received any pain medication.  MD called and made aware.  Per MD orders, 2 Percocet PO and Zofran PO given at 2130.  Upon reassessment at approximately 2230, the patient was asleep.  Per her sister at the bedside, the pain medication had helped greatly.  Will continue to monitor patient.  Earleen Reaper RN-BC, [employer]

## 2016-01-08 NOTE — Progress Notes (Signed)
   Subjective: Patient reports one episode of emesis this morning after breakfast and continues to experience nausea. Says Zofran is not helping. She continues to complain of severe left sided abdominal and flank pain. She denies dysuria and vaginal discharge.  Objective:  Vital signs in last 24 hours: Vitals:   01/07/16 0939 01/07/16 1700 01/07/16 2048 01/08/16 0442  BP: (!) 93/55 115/67 125/76 (!) 110/54  Pulse: 71 75 79 67  Resp: 18 18 18 18   Temp: 98.3 F (36.8 C) 98.5 F (36.9 C) 98.8 F (37.1 C) 98.4 F (36.9 C)  TempSrc: Oral Oral Oral Oral  SpO2: 97% 99% 100% 98%  Weight:   255 lb 4.7 oz (115.8 kg)   Height:       Physical Exam Constitutional: NAD, appears comfortable Cardiovascular: RRR, no murmurs, rubs, or gallops.  Pulmonary/Chest: CTAB, no wheezes, rales, or rhonchi. No chest wall abnormalities.  Abdominal: Soft and nondistended, diffusely tender to palpation but worse over left lower quandrent, left CVA tenderness, normal bowel sounds.  Neurological: A&Ox3, CN II - XII grossly intact.  Skin: No rashes or erythema  Psychiatric: Normal mood and affect  Assessment/Plan: Acute pyelonephritis & PID: Recent urine culture positive for cephalosporin sensitive E coli and GC probe positive at gynecologist office visit a few days ago. Also clue cells on wet prep consistent with BV. She was prescribed a course of flagyl but has developed N/V and has been unable to keep down her medication.  Patient reports fevers at home with diffuse left sided abdominal and flank pain with CVA tenderness. She endorses dyspareunia. Physical exam notable for cervical motion tenderness per ED physician. UA with positive nitrites and large leukocytes, few bacteria.  Imaging consistent with inflammatory changes of Left ureter suggestive of pyelo. S/p 2L NS, 1 x IV ceftriaxone, azithro, and keflex in ED.   -- 1L NS bolus today  -- Continue ceftriaxone 1g IV daily -- Doxy 100 mg po BID -- Metronidazole  500 mg po BID -- Tylenol 650 mg po Q6 hrs prn and percocet 1-2 tablets q6hrs prn for pain  -- Zofran changed to phenergan 25 mg PO q6hrs prn for nausea -- HIV and RPR non reactive -- repeat GC/Chlamydia probe pending -- Blood cultures pending -- Repeat CBC and BMP in AM  FEN: NS 125 cc/hr, replete lytes prn, heart healthy diet VTE ppx: Lovenox  Code Status: FULL   Dispo: Anticipated discharge in approximately 1-2 day(s).   Velna Ochs, MD 01/08/2016, 7:49 AM Pager: [phone]

## 2016-01-08 NOTE — Care Management Note (Signed)
Case Management Note  Patient Details  Name: Sabrina Harding MRN: [id number] Date of Birth: 1980/05/30  Subjective/Objective:              Admitted with Pyelonephritis/ PID from home with family. Pt states independent with ADL'S and no DME usage PTA.  Pt without health insurance / no PCP. CM spoke and provided  pt with information about the [facility]. Post hospital f/u scheduled on Oct .5, 2017@ 10:00am @ the [facility].   Action/Plan: Return to home when medically stable. CM to f/u with disposition needs.  Expected Discharge Date:               Expected Discharge Plan:  Home/Self Care (Resides with mom)  [facility] [phone]          In-House Referral:     Discharge planning Services  CM Consult, Follow-up appt scheduled, [place] Acute Care Choice:    Choice offered to:     DME Arranged:    DME Agency:     HH Arranged:    [place] Agency:     Status of Service:  In process, will continue to follow  If discussed at Long Length of Stay Meetings, dates discussed:    Additional Comments:  Sharin Mons, RN 01/08/2016, 12:02 PM

## 2016-01-08 NOTE — Progress Notes (Signed)
Pt. Currently inpatient at [facility].  Treated for positive culture results. Fax'd information to health department.

## 2016-01-09 ENCOUNTER — Telehealth (HOSPITAL_BASED_OUTPATIENT_CLINIC_OR_DEPARTMENT_OTHER): Payer: Self-pay | Admitting: Emergency Medicine

## 2016-01-09 DIAGNOSIS — D509 Iron deficiency anemia, unspecified: Secondary | ICD-10-CM

## 2016-01-09 LAB — BASIC METABOLIC PANEL WITH GFR
Anion gap: 3 — ABNORMAL LOW (ref 5–15)
BUN: <5 mg/dL — ABNORMAL LOW (ref 6–20)
CO2: 24 mmol/L (ref 22–32)
Calcium: 8.2 mg/dL — ABNORMAL LOW (ref 8.9–10.3)
Chloride: 111 mmol/L (ref 101–111)
Creatinine, Ser: 0.95 mg/dL (ref 0.44–1.00)
GFR calc Af Amer: >60 mL/min
GFR calc non Af Amer: >60 mL/min
Glucose, Bld: 86 mg/dL (ref 65–99)
Potassium: 3.9 mmol/L (ref 3.5–5.1)
Sodium: 138 mmol/L (ref 135–145)

## 2016-01-09 LAB — CBC
HCT: 31.6 % — ABNORMAL LOW (ref 36.0–46.0)
Hemoglobin: 9.8 g/dL — ABNORMAL LOW (ref 12.0–15.0)
MCH: 23.6 pg — ABNORMAL LOW (ref 26.0–34.0)
MCHC: 31 g/dL (ref 30.0–36.0)
MCV: 76.1 fL — ABNORMAL LOW (ref 78.0–100.0)
Platelets: 260 K/uL (ref 150–400)
RBC: 4.15 MIL/uL (ref 3.87–5.11)
RDW: 15.1 % (ref 11.5–15.5)
WBC: 4.1 K/uL (ref 4.0–10.5)

## 2016-01-09 MED ORDER — SULFAMETHOXAZOLE-TRIMETHOPRIM 800-160 MG PO TABS
1.0000 | ORAL_TABLET | Freq: Two times a day (BID) | ORAL | Status: DC
  Administered 2016-01-10 – 2016-01-11 (×3): 1 via ORAL
  Filled 2016-01-09 (×3): qty 1

## 2016-01-09 MED ORDER — BACITRACIN-NEOMYCIN-POLYMYXIN OINTMENT TUBE
TOPICAL_OINTMENT | CUTANEOUS | Status: DC | PRN
  Administered 2016-01-10 – 2016-01-11 (×3): via TOPICAL
  Filled 2016-01-09 (×2): qty 15

## 2016-01-09 NOTE — Telephone Encounter (Signed)
RN Joyce Gross on unit [facility] called and notified of + gonorrhea on Infectious disease report this am, she will advise MD and patient accordingly

## 2016-01-09 NOTE — Progress Notes (Signed)
   Subjective: Patient was afebrile overnight. She continues to complain of pain, nausea, and vomiting. She is getting prn phenergan with some relief.   Objective:  Vital signs in last 24 hours: Vitals:   01/08/16 1800 01/08/16 2031 01/09/16 0447 01/09/16 0946  BP: (!) 140/55 140/84 124/65 129/74  Pulse: 62 80 67 72  Resp: 18 19 18 18   Temp: 97.8 F (36.6 C) 99.4 F (37.4 C) 98.9 F (37.2 C) 99.5 F (37.5 C)  TempSrc: Oral Oral Oral Oral  SpO2: 98% 100% 100% 100%  Weight:  274 lb 0.5 oz (124.3 kg)    Height:       Physical Exam Constitutional: NAD, appears comfortable Cardiovascular:RRR, no murmurs, rubs, or gallops.  Pulmonary/Chest:CTAB, no wheezes, rales, or rhonchi. No chest wall abnormalities.  Abdominal:Soft and nondistended, diffusely tender to palpation but worse over left lower quandrent, left CVA tenderness, normal bowel sounds.  Neurological:A&Ox3, CN II - XII grossly intact.  Skin: No rashes or erythema  Psychiatric:Normal mood and affect  Assessment/Plan:  Acute pyelonephritis & [id number] urine culture positive for cephalosporin sensitive E coli and GC probe positive at gynecologist office visit a few days ago. Also clue cells on wet prep consistent with BV. She was prescribed a course of flagyl but has developed N/V and has been unable to keep down her medication. Patient reports fevers at home with diffuse left sided abdominal and flank pain with CVA tenderness. She endorses dyspareunia. Physical exam notable for cervical motion tenderness per ED physician. UA with positive nitrites and large leukocytes, few bacteria. Imaging consistent with inflammatory changes of Left ureter suggestive of pyelo. S/p 2L NS, 1 x IV ceftriaxone, azithro, and keflex in ED. Chlamydia/GC probe positive for gonorrhea.  -- Blood cultures (10/1) NGTD x2 -- Continue ceftriaxone 1g IV today, change to PO bactrim BID tomorrow to complete 7 day course (last day 01/13/16) -- Continue  Doxy 100 mg po BID & Metronidazole 500 mg po BID for 7 days (last day 01/13/16) -- Tylenol 650 mg po Q6 hrs prn and percocet 1-2 tablets q6hrs prn for pain  -- Zofran changed to phenergan 25 mg PO q6hrs prn for nausea -- HIV and RPR non reactive  FEN: NS 125 cc/hr, replete lytes prn, heart healthydiet VTE ppx: Lovenox  Code Status: FULL   Dispo: Anticipated discharge in approximately 1 day(s).   Velna Ochs, MD 01/09/2016, 12:46 PM Pager: [phone]

## 2016-01-09 NOTE — Progress Notes (Signed)
Attempted to ambulate patient, took 5 to 6 steps and she started feeling dizzy, checked BP 119/76, 82 HR, PO2 100%.  She said she will try later.

## 2016-01-10 ENCOUNTER — Telehealth: Payer: Self-pay

## 2016-01-10 DIAGNOSIS — B002 Herpesviral gingivostomatitis and pharyngotonsillitis: Secondary | ICD-10-CM | POA: Diagnosis present

## 2016-01-10 LAB — IRON AND TIBC
IRON: 46 ug/dL (ref 28–170)
Saturation Ratios: 23 % (ref 10.4–31.8)
TIBC: 202 ug/dL — ABNORMAL LOW (ref 250–450)
UIBC: 156 ug/dL

## 2016-01-10 LAB — CBC
HCT: 31.4 % — ABNORMAL LOW (ref 36.0–46.0)
Hemoglobin: 9.7 g/dL — ABNORMAL LOW (ref 12.0–15.0)
MCH: 23.5 pg — AB (ref 26.0–34.0)
MCHC: 30.9 g/dL (ref 30.0–36.0)
MCV: 76 fL — AB (ref 78.0–100.0)
PLATELETS: 297 10*3/uL (ref 150–400)
RBC: 4.13 MIL/uL (ref 3.87–5.11)
RDW: 15.2 % (ref 11.5–15.5)
WBC: 4.5 10*3/uL (ref 4.0–10.5)

## 2016-01-10 LAB — FERRITIN: Ferritin: 134 ng/mL (ref 11–307)

## 2016-01-10 MED ORDER — POLYETHYLENE GLYCOL 3350 17 G PO PACK
17.0000 g | PACK | Freq: Every day | ORAL | Status: DC
  Filled 2016-01-10: qty 1

## 2016-01-10 MED ORDER — METRONIDAZOLE 500 MG PO TABS: 500.0000 mg | ORAL_TABLET | Freq: Two times a day (BID) | ORAL | 0 refills | Status: DC

## 2016-01-10 MED ORDER — ACYCLOVIR 400 MG PO TABS: 400.0000 mg | ORAL_TABLET | Freq: Every day | ORAL | 0 refills | Status: DC

## 2016-01-10 MED ORDER — DOXYCYCLINE HYCLATE 100 MG PO TABS: 100.0000 mg | ORAL_TABLET | Freq: Two times a day (BID) | ORAL | 0 refills | Status: DC

## 2016-01-10 MED ORDER — NAPROXEN 500 MG PO TABS
500.0000 mg | ORAL_TABLET | Freq: Once | ORAL | Status: AC
  Administered 2016-01-10: 500 mg via ORAL
  Filled 2016-01-10: qty 1

## 2016-01-10 MED ORDER — ACYCLOVIR 400 MG PO TABS
400.0000 mg | ORAL_TABLET | Freq: Every day | ORAL | Status: DC
  Administered 2016-01-10 – 2016-01-11 (×7): 400 mg via ORAL
  Filled 2016-01-10 (×8): qty 1

## 2016-01-10 MED ORDER — NAPROXEN 250 MG PO TABS
250.0000 mg | ORAL_TABLET | ORAL | Status: AC | PRN
  Administered 2016-01-11: 250 mg via ORAL
  Filled 2016-01-10: qty 1

## 2016-01-10 MED ORDER — SULFAMETHOXAZOLE-TRIMETHOPRIM 800-160 MG PO TABS: 1.0000 | ORAL_TABLET | Freq: Two times a day (BID) | ORAL | 0 refills | Status: DC

## 2016-01-10 MED ORDER — SENNOSIDES-DOCUSATE SODIUM 8.6-50 MG PO TABS
1.0000 | ORAL_TABLET | Freq: Every day | ORAL | Status: DC
  Administered 2016-01-10 – 2016-01-11 (×2): 1 via ORAL
  Filled 2016-01-10 (×2): qty 1

## 2016-01-10 NOTE — Care Management Note (Signed)
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 Contacted [facility] and medications $67.00. Pt states she can afford medications. Contacted [facility] to cancel appt. Pt has appt in [facility] on 01/17/2016 at 10:45 am.    Expected Discharge Date:  01/10/2016              Expected Discharge Plan:  Home/Self Care (Resides with mom)  In-House Referral:  NA  Discharge planning Services  CM Consult, Follow-up appt scheduled, [place] Acute Care Choice:  NA Choice offered to:  NA  DME Arranged:  N/A DME Agency:  NA  HH Arranged:  NA HH Agency:  NA  Status of Service:  Completed, signed off  If discussed at [place] of Stay Meetings, dates discussed:    Additional Comments:  Erenest Rasher, RN 01/10/2016, 12:01 PM

## 2016-01-10 NOTE — Telephone Encounter (Signed)
Call received from Jonnie Finner, RN CM requesting that the patient's appointment at [facility] for 01/11/16 be cancelled as she already has an appointment scheduled with [facility]

## 2016-01-10 NOTE — Progress Notes (Signed)
Counseled on new antibiotic, antiviral, and antifungal regimen initiated this admission. Covered indication, instructions, side effects, and important counseling points for acyclovir, doxycycline, metronidazole, and SMX-TMP. Patient informed of importance of adherence and hydration while taking medications.   Annabelle Harman, Jerald Kief PharmD Candidate

## 2016-01-10 NOTE — Discharge Instructions (Signed)
Sabrina Harding,  You were treated for a kidney infection called Pyelonephritis. You also were treated for pelvic inflammatory disease (PID) secondary to gonorrhea infection. Your lip and nose rash is likely secondary to herpes which can occur when you are sick. To effectively treat these infections, you will need to complete a course of antibiotics and antivirals with Trimethoprim-Sulfa, Doxycycline, Metronidazole, and Acyclovir. Please follow up in our clinic for your follow up appointment on 01/17/16 at 10:45 am in the basement of the hospital on the [facility].

## 2016-01-10 NOTE — Progress Notes (Signed)
   Subjective: Patient is feeling much better today. Denies nausea / vomiting. Tolerating PO well. Pain is improving. She has not required any PRNs since yesterday evening. She is complaining of constipation - last BM 1 weeks ago, and a new rash around her nose.   Objective:  Vital signs in last 24 hours: Vitals:   01/09/16 1728 01/09/16 1918 01/10/16 0648 01/10/16 0855  BP: 135/66 118/68 117/64 139/86  Pulse: 84 (!) 57 (!) 52 64  Resp: 18 20 18 18   Temp: 98.2 F (36.8 C) 98.8 F (37.1 C) 97.9 F (36.6 C) 98.4 F (36.9 C)  TempSrc: Oral   Oral  SpO2: 100% 100% 96% 96%  Weight:  269 lb 10 oz (122.3 kg)    Height:       Physical Exam Constitutional: NAD, appears comfortable Cardiovascular:RRR, no murmurs, rubs, or gallops.  Pulmonary/Chest:CTAB, no wheezes, rales, or rhonchi. No chest wall abnormalities.  Abdominal:Soft and nondistended, Mildly tender to palpation - improved, normal bowel sounds.  Neurological:A&Ox3, CN II - XII grossly intact.  Skin: Small vesicular rash at base of left nostril  Psychiatric:Normal mood and affect  Assessment/Plan:  Acute pyelonephritis:Recent urine culture at gynecologist office positive for cephalosporin sensitive E coli. Patient reports fevers at home with diffuse left sided abdominal and flank pain. Physical exam significant for CVA tenderness. UA on admission with positive nitrites and large leukocytes, few bacteria. Repeat urine culture again grew cephalosporin sensitive E coli. Imaging consistent with inflammatory changes of Left ureter suggestive of pyelo. -- Blood cultures (10/1) NGTD x2 -- IV ceftriaxone change to PO bactrim BID today to complete 14 day course  -- Tylenol 650 mg po Q6 hrs prn and percocet 1-2 tablets q6hrs prn for pain  -- Zofran changed to phenergan 25 mg PO q6hrs prn for nausea  Pelvic Inflammatory Disease: GC probe positive at gynecologist office a few days prior to admission. Also with clue cells on wet prep  consistent with BV. Patient was started on antibiotic treatment however subsequently developed N/V and was unable to keep her medication down. She presented with fever, diffuse left sided abdominal pain, and CVA tenderness. Endorsed dyspareunia on ROS. Physical exam notable for cervical motion tenderness per ED physician. Repeat Chlamydia/GC probe positive for gonorrhea. Repeat wet prep negative. HIV and RPR non reactive.  -- Received IV ceftriaxone for 3 days for pyelonephritis which also provides gonorrhea coverage -- Continue doxycycline 100 mg BID and metronidazole 500 mg BID to complete a 14 day course  Herpes Simplex: New painful, vesicular rash at base of left nostril consistent with herpes x 1 day. Patient has never had herpes or cold sores before. -- Will treat with po Acyclovir 400 mg 5 x daily for 7 days   FEN: fluids discontinued, replete lytes prn, heart healthydiet VTE ppx: Lovenox  Code Status: FULL   Dispo: Anticipated discharge today.  Velna Ochs, MD 01/10/2016, 11:38 AM Pager: [phone]

## 2016-01-10 NOTE — Progress Notes (Signed)
Patient c/o severe frontal headache going around her head.Patient refused for RN to turn lights on and just covered her head with blanket.When RN asked if this headache is something new that just came on tonight,patient said" I had it all day" but she has not told the day shift RN or MD. Liana Gerold tylenol but patient said "it doesn't work". MD on call notified. New orders received in epic. Will continue to monitor. Antoinette Haskett, Wonda Cheng, Therapist, sports

## 2016-01-11 ENCOUNTER — Inpatient Hospital Stay: Payer: Self-pay

## 2016-01-11 DIAGNOSIS — B9620 Unspecified Escherichia coli [E. coli] as the cause of diseases classified elsewhere: Secondary | ICD-10-CM

## 2016-01-11 DIAGNOSIS — N10 Acute tubulo-interstitial nephritis: Principal | ICD-10-CM

## 2016-01-11 DIAGNOSIS — B009 Herpesviral infection, unspecified: Secondary | ICD-10-CM

## 2016-01-11 DIAGNOSIS — N738 Other specified female pelvic inflammatory diseases: Secondary | ICD-10-CM

## 2016-01-11 MED ORDER — ONDANSETRON HCL 4 MG PO TABS
8.0000 mg | ORAL_TABLET | Freq: Once | ORAL | Status: AC
  Administered 2016-01-11: 8 mg via ORAL
  Filled 2016-01-11: qty 2

## 2016-01-11 MED ORDER — ONDANSETRON 4 MG PO TBDP: 4.0000 mg | ORAL_TABLET | Freq: Three times a day (TID) | ORAL | 0 refills | Status: DC | PRN

## 2016-01-11 NOTE — Discharge Summary (Signed)
Name: Sabrina Harding MRN: [id number] DOB: May 19, 1980 35 y.o. PCP: No Pcp Per Patient  Date of Admission: 01/06/2016  4:43 PM Date of Discharge: 01/12/2016 Attending Physician: No att. providers found  Discharge Diagnosis: 1. Pyelonephritis, PID  Discharge Medications:   Medication List    STOP taking these medications   aspirin-acetaminophen-caffeine 250-250-65 MG tablet Commonly known as:  EXCEDRIN MIGRAINE   ibuprofen 200 MG tablet Commonly known as:  ADVIL,MOTRIN     TAKE these medications   acyclovir 400 MG tablet Commonly known as:  ZOVIRAX Take 1 tablet (400 mg total) by mouth 5 (five) times daily.   doxycycline 100 MG tablet Commonly known as:  VIBRA-TABS Take 1 tablet (100 mg total) by mouth every 12 (twelve) hours.   metroNIDAZOLE 500 MG tablet Commonly known as:  FLAGYL Take 1 tablet (500 mg total) by mouth 2 (two) times daily.   ondansetron 4 MG disintegrating tablet Commonly known as:  ZOFRAN ODT Take 1-2 tablets (4-8 mg total) by mouth every 8 (eight) hours as needed for nausea or vomiting.   sulfamethoxazole-trimethoprim 800-160 MG tablet Commonly known as:  BACTRIM DS,SEPTRA DS Take 1 tablet by mouth 2 (two) times daily.       Disposition and follow-up:   Sabrina Harding was discharged from [facility] in Stable condition.  At the hospital follow up visit please address:  1.  Please follow up with patient on resolution of her symptoms. She denied dysuria and vaginal discharge on hospital discharge but was still reporting abdominal pain, flank pain, and nausea. Please ensure patient's partner(s) have also been treated to avoid reinfection. Counsel on safe sex practices.   2.  Labs / imaging needed at time of follow-up: Repeat UA and CG/Chlamydia probe to ensure resolution   3.  Pending labs/ test needing follow-up: None  Follow-up Appointments: Follow-up Information    [place] Follow  up on 01/15/2016.   Why:  10:45 am for hospital follow up. We are located in the basement of the hospital on the [facility] Please bring your medicines with you.  Contact information: [address] [place] [facility] Course by problem list:  Acute pyelonephritis:Urine culture at gynecologist office positive for cephalosporin sensitive E coli a few days prior to admission. Patient reported fevers at home with diffuse left sided abdominal and flank pain. Physical exam significant for CVA tenderness. UA on admissionwith positive nitrites and large leukocytes, few bacteria. Repeat urine culture again grew cephalosporin sensitive E coli.Imaging consistent with inflammatory changes of Left ureter suggestive of pyelo. Blood cultures remained negative. She was treated with IV ceftriaxone for 3 days then transitioned to bactrim BID with plan to complete a 14 day course.   Pelvic Inflammatory Disease: GC probe positive at gynecologist office a few days prior to admission. Also with clue cells on wet prep at that time consistent with BV. Patient was started on antibiotic treatment however subsequently developed N/V and was unable to keep her medication down. She presented with fever, diffuse left sided abdominal pain, and CVA tenderness. Endorsed dyspareunia on ROS. Physical exam notable for cervical motion tenderness per ED physician. Repeat Chlamydia/GC probe positive for gonorrhea. Repeat wet prep negative. HIV and RPR non reactive. Received IV ceftriaxone for 3 days for pyelonephritis which also provides gonorrhea coverage. She was treated with doxycycline 100 mg BID and metronidazole 500 mg BID and discharged  with prescriptions to complete a 14 day course.  Herpes Simplex: Painful, vesicular rash at base of left nostril consistent with herpes. Noted 2 days prior to discharge. Patient was started on po Acyclovir 400 mg 5 x daily with improvement prior to  discharge. Patient given a prescription to complete a 7 day course.  Discharge Vitals:   BP (!) 142/80 (BP Location: Right Arm)   Pulse 78   Temp 97.6 F (36.4 C) (Oral)   Resp 20   Ht 5\' 5"  (1.651 m)   Wt 269 lb 13.5 oz (122.4 kg)   SpO2 100%   BMI 44.90 kg/m   Pertinent Labs, Studies, and Procedures:   01/06/2016 CT Abdomen Pelvis: IMPRESSION: Wall thickening and stranding involving the left ureter may represent changes of pyelonephritis. Correlation with laboratory studies is recommended. No evidence of bowel obstruction or inflammation.  01/06/16 Labs:  Urinalysis:  Ref. Range 01/06/2016 14:26  Appearance Latest Ref Range: CLEAR  TURBID (A)  Bacteria, UA Latest Ref Range: NONE SEEN  FEW (A)  Bilirubin Urine Latest Ref Range: NEGATIVE  NEGATIVE  Color, Urine Latest Ref Range: YELLOW  YELLOW  Glucose Latest Ref Range: NEGATIVE mg/dL NEGATIVE  Hgb urine dipstick Latest Ref Range: NEGATIVE  LARGE (A)  Ketones, ur Latest Ref Range: NEGATIVE mg/dL NEGATIVE  Leukocytes, UA Latest Ref Range: NEGATIVE  LARGE (A)  Nitrite Latest Ref Range: NEGATIVE  POSITIVE (A)  pH Latest Ref Range: 5.0 - 8.0  5.5  Protein Latest Ref Range: NEGATIVE mg/dL 30 (A)  RBC / HPF Latest Ref Range: 0 - 5 RBC/hpf 6-30  Specific Gravity, Urine Latest Ref Range: 1.005 - 1.030  1.012  Squamous Epithelial / LPF Latest Ref Range: NONE SEEN  6-30 (A)  WBC, UA Latest Ref Range: 0 - 5 WBC/hpf TOO NUMEROUS TO C...   Urine Culture:  Ref. Range 01/06/2016 14:21  Organism ID, Bacteria Unknown ESCHERICHIA COLI (A)   UPT:  Ref. Range 01/06/2016 19:33  Preg Test, Ur Latest Ref Range: NEGATIVE  NEGATIVE   CG/Chlamydia Probe:  Ref. Range 01/06/2016 00:00 01/06/2016 17:40  Chlamydia Unknown Negative   Neisseria gonorrhea Unknown **POSITIVE** (A)   RPR Latest Ref Range: Non Reactive   Non Reactive    Ref. Range 01/06/2016 17:40  HIV Latest Ref Range: Non Reactive  Non Reactive   Wet Prep:  Ref. Range 01/06/2016  19:32  Yeast Wet Prep HPF POC Latest Ref Range: NONE SEEN  NONE SEEN  Trich, Wet Prep Latest Ref Range: NONE SEEN  NONE SEEN  Clue Cells Wet Prep HPF POC Latest Ref Range: NONE SEEN  NONE SEEN  WBC, Wet Prep HPF POC Latest Ref Range: NONE SEEN  FEW (A)     Discharge Instructions: Discharge Instructions    Call MD for:  persistant nausea and vomiting    Complete by:  As directed    Call MD for:  severe uncontrolled pain    Complete by:  As directed    Call MD for:  temperature >100.4    Complete by:  As directed    Diet - low sodium heart healthy    Complete by:  As directed    Diet - low sodium heart healthy    Complete by:  As directed    Discharge instructions    Complete by:  As directed    Please take all of your medications as prescribed. For the next 10 days, please take bactrim, metronidazole, and doxycycline each twice a day. For  the next 6 days, please take acyclovir 5 times daily. It is important that you finish your antibiotic course to prevent recurrent infection. For your nausea, you may take 1-2 tablets of zofran every 8 hours as needed. You may try taking this medicine 30 minutes to 1 hour before taking your antibiotics to prevent nausea and throwing up. Please follow up in our clinic for your scheduled appointment and call your gynecologist to schedule an appointment. If you have any questions or concerns, call our clinic at [phone] or after hours call [phone] and ask for the internal medicine resident on call. Thank you!   Increase activity slowly    Complete by:  As directed    Increase activity slowly    Complete by:  As directed       Signed: Velna Ochs, MD 01/12/2016, 2:26 PM   Pager: [phone]

## 2016-01-11 NOTE — Progress Notes (Signed)
   Subjective: Patient feels well this morning. Pain is significantly improved. She continues to experience nausea but feels ready to go home.  Objective:  Vital signs in last 24 hours: Vitals:   01/10/16 0855 01/10/16 1855 01/10/16 2050 01/11/16 0622  BP: 139/86 106/79 124/60 133/75  Pulse: 64 (!) 54 (!) 48 (!) 54  Resp: 18 18 19 20   Temp: 98.4 F (36.9 C) 98.4 F (36.9 C) 98.2 F (36.8 C) 98.7 F (37.1 C)  TempSrc: Oral Oral Oral Oral  SpO2: 96% 100% 100% 100%  Weight:   269 lb 13.5 oz (122.4 kg)   Height:       Physical Exam Constitutional: NAD, appears comfortable Cardiovascular:RRR, no murmurs, rubs, or gallops.  Pulmonary/Chest:CTAB, no wheezes, rales, or rhonchi. No chest wall abnormalities.  Abdominal:Soft and nondistended, Mildly tender to palpation  Neurological:A&Ox3, CN II - XII grossly intact.  Skin: Small vesicular rash at base of left nostril improved Psychiatric:Normal mood and affect   Assessment/Plan:  Acute pyelonephritis:Recent urine culture at gynecologist office positive for cephalosporin sensitive E coli. Patient reports fevers at home with diffuse left sided abdominal and flank pain. Physical exam significant for CVA tenderness. UA on admission with positive nitrites and large leukocytes, few bacteria. Repeat urine culture again grew cephalosporin sensitive E coli. Imaging consistent with inflammatory changes of Left ureter suggestive of pyelo. -- Blood cultures (10/1) NGTD x2 -- Bactrim BID to complete 14 day course  -- Tylenol 650 mg po Q6 hrs prn and percocet 1-2 tablets q6hrs prn for pain  -- phenergan 25 mg PO q6hrs prn for nausea  Pelvic Inflammatory Disease: GC probe positive at gynecologist office a few days prior to admission. Also with clue cells on wet prep consistent with BV. Patient was started on antibiotic treatment however subsequently developed N/V and was unable to keep her medication down. She presented with fever, diffuse  left sided abdominal pain, and CVA tenderness. Endorsed dyspareunia on ROS. Physical exam notable for cervical motion tenderness per ED physician. Repeat Chlamydia/GC probe positive for gonorrhea. Repeat wet prep negative. HIV and RPR non reactive.  -- Received IV ceftriaxone for 3 days for pyelonephritis which also provides gonorrhea coverage -- Continue doxycycline 100 mg BID and metronidazole 500 mg BID to complete a 14 day course  Herpes Simplex: Painful, vesicular rash at base of left nostril consistent with herpes x 2 days - improving. Patient has never had herpes or cold sores before. -- Will treat with po Acyclovir 400 mg 5 x daily for 7 days   FEN: fluids discontinued, replete lytes prn, heart healthydiet VTE ppx: Lovenox  Code Status: FULL   Dispo: Anticipated discharge today.   Velna Ochs, MD 01/11/2016, 10:22 AM Pager: [phone]

## 2016-01-11 NOTE — Progress Notes (Signed)
Patient discharge teaching given, including activity, diet, follow-up appoints, and medications. Patient verbalized understanding of all discharge instructions. IV access was d/c'd. Vitals are stable. Skin is intact except as charted in most recent assessments. Sabrina Harding L-RN

## 2016-01-12 LAB — CULTURE, BLOOD (ROUTINE X 2)
Culture: NO GROWTH
Culture: NO GROWTH

## 2016-01-15 ENCOUNTER — Encounter: Payer: Self-pay | Admitting: General Practice

## 2016-01-15 ENCOUNTER — Ambulatory Visit: Payer: Self-pay

## 2016-01-17 ENCOUNTER — Ambulatory Visit: Payer: Self-pay

## 2016-05-18 ENCOUNTER — Encounter (HOSPITAL_COMMUNITY): Payer: Self-pay

## 2016-05-18 DIAGNOSIS — Z79899 Other long term (current) drug therapy: Secondary | ICD-10-CM | POA: Insufficient documentation

## 2016-05-18 DIAGNOSIS — N3000 Acute cystitis without hematuria: Secondary | ICD-10-CM | POA: Insufficient documentation

## 2016-05-18 DIAGNOSIS — F17210 Nicotine dependence, cigarettes, uncomplicated: Secondary | ICD-10-CM | POA: Insufficient documentation

## 2016-05-18 DIAGNOSIS — N898 Other specified noninflammatory disorders of vagina: Secondary | ICD-10-CM | POA: Insufficient documentation

## 2016-05-18 NOTE — ED Triage Notes (Signed)
Left flank pain for a couple of days with dizziness and lots of pain no fever noted. With vomiting voiced.

## 2016-05-19 ENCOUNTER — Emergency Department (HOSPITAL_COMMUNITY): Payer: Self-pay

## 2016-05-19 ENCOUNTER — Emergency Department (HOSPITAL_COMMUNITY)
Admission: EM | Admit: 2016-05-19 | Discharge: 2016-05-19 | Disposition: A | Discharge status: Home / Self Care | Payer: Self-pay | Attending: Emergency Medicine | Admitting: Emergency Medicine

## 2016-05-19 DIAGNOSIS — N3000 Acute cystitis without hematuria: Secondary | ICD-10-CM

## 2016-05-19 DIAGNOSIS — R109 Unspecified abdominal pain: Secondary | ICD-10-CM

## 2016-05-19 DIAGNOSIS — N898 Other specified noninflammatory disorders of vagina: Secondary | ICD-10-CM

## 2016-05-19 LAB — COMPREHENSIVE METABOLIC PANEL
ALT: 17 U/L (ref 14–54)
AST: 18 U/L (ref 15–41)
Albumin: 3.8 g/dL (ref 3.5–5.0)
Alkaline Phosphatase: 89 U/L (ref 38–126)
Anion gap: 6 (ref 5–15)
BUN: 14 mg/dL (ref 6–20)
CHLORIDE: 103 mmol/L (ref 101–111)
CO2: 27 mmol/L (ref 22–32)
CREATININE: 0.95 mg/dL (ref 0.44–1.00)
Calcium: 9.1 mg/dL (ref 8.9–10.3)
GFR calc non Af Amer: >60 mL/min (ref >60)
Glucose, Bld: 99 mg/dL (ref 65–99)
POTASSIUM: 4.1 mmol/L (ref 3.5–5.1)
SODIUM: 136 mmol/L (ref 135–145)
Total Bilirubin: 0.3 mg/dL (ref 0.3–1.2)
Total Protein: 7.8 g/dL (ref 6.5–8.1)

## 2016-05-19 LAB — WET PREP, GENITAL
CLUE CELLS WET PREP: NONE SEEN
Sperm: NONE SEEN
Trich, Wet Prep: NONE SEEN
YEAST WET PREP: NONE SEEN

## 2016-05-19 LAB — CBC WITH DIFFERENTIAL/PLATELET
Basophils Absolute: 0 10*3/uL (ref 0.0–0.1)
Basophils Relative: 0 %
EOS ABS: 0.3 10*3/uL (ref 0.0–0.7)
Eosinophils Relative: 4 %
HCT: 36.9 % (ref 36.0–46.0)
HEMOGLOBIN: 12.2 g/dL (ref 12.0–15.0)
LYMPHS ABS: 1.8 10*3/uL (ref 0.7–4.0)
LYMPHS PCT: 22 %
MCH: 24.8 pg — AB (ref 26.0–34.0)
MCHC: 33.1 g/dL (ref 30.0–36.0)
MCV: 75.2 fL — AB (ref 78.0–100.0)
MONOS PCT: 8 %
Monocytes Absolute: 0.7 10*3/uL (ref 0.1–1.0)
NEUTROS PCT: 66 %
Neutro Abs: 5.7 10*3/uL (ref 1.7–7.7)
Platelets: 325 10*3/uL (ref 150–400)
RBC: 4.91 MIL/uL (ref 3.87–5.11)
RDW: 15.2 % (ref 11.5–15.5)
WBC: 8.5 10*3/uL (ref 4.0–10.5)

## 2016-05-19 LAB — URINALYSIS, ROUTINE W REFLEX MICROSCOPIC
Bilirubin Urine: NEGATIVE
Glucose, UA: NEGATIVE mg/dL
Hgb urine dipstick: NEGATIVE
Ketones, ur: NEGATIVE mg/dL
Nitrite: POSITIVE — AB
PROTEIN: NEGATIVE mg/dL
SPECIFIC GRAVITY, URINE: 1.013 (ref 1.005–1.030)
pH: 6 (ref 5.0–8.0)

## 2016-05-19 LAB — RAPID URINE DRUG SCREEN, HOSP PERFORMED
Amphetamines: NOT DETECTED
BARBITURATES: NOT DETECTED
BENZODIAZEPINES: NOT DETECTED
COCAINE: NOT DETECTED
Opiates: NOT DETECTED
TETRAHYDROCANNABINOL: NOT DETECTED

## 2016-05-19 LAB — POC URINE PREG, ED: PREG TEST UR: NEGATIVE

## 2016-05-19 MED ORDER — SODIUM CHLORIDE 0.9 % IV BOLUS (SEPSIS)
1000.0000 mL | Freq: Once | INTRAVENOUS | Status: AC
  Administered 2016-05-19: 1000 mL via INTRAVENOUS

## 2016-05-19 MED ORDER — AZITHROMYCIN 250 MG PO TABS
1000.0000 mg | ORAL_TABLET | Freq: Once | ORAL | Status: AC
  Administered 2016-05-19: 1000 mg via ORAL
  Filled 2016-05-19: qty 4

## 2016-05-19 MED ORDER — ONDANSETRON 4 MG PO TBDP: 4.0000 mg | ORAL_TABLET | Freq: Three times a day (TID) | ORAL | 0 refills | Status: DC | PRN

## 2016-05-19 MED ORDER — ONDANSETRON 8 MG PO TBDP
8.0000 mg | ORAL_TABLET | Freq: Once | ORAL | Status: AC
  Administered 2016-05-19: 8 mg via ORAL
  Filled 2016-05-19: qty 1

## 2016-05-19 MED ORDER — ONDANSETRON 4 MG PO TBDP: 4.0000 mg | ORAL_TABLET | Freq: Once | ORAL | Status: DC

## 2016-05-19 MED ORDER — DOXYCYCLINE HYCLATE 100 MG PO CAPS: 100.0000 mg | ORAL_CAPSULE | Freq: Two times a day (BID) | ORAL | 0 refills | Status: DC

## 2016-05-19 MED ORDER — PROMETHAZINE HCL 25 MG/ML IJ SOLN
12.5000 mg | Freq: Once | INTRAMUSCULAR | Status: AC
  Administered 2016-05-19: 12.5 mg via INTRAVENOUS
  Filled 2016-05-19: qty 1

## 2016-05-19 MED ORDER — OXYCODONE-ACETAMINOPHEN 5-325 MG PO TABS
1.0000 | ORAL_TABLET | Freq: Once | ORAL | Status: AC
  Administered 2016-05-19: 1 via ORAL
  Filled 2016-05-19: qty 1

## 2016-05-19 MED ORDER — DEXTROSE 5 % IV SOLN
1.0000 g | Freq: Once | INTRAVENOUS | Status: AC
  Administered 2016-05-19: 1 g via INTRAVENOUS
  Filled 2016-05-19: qty 10

## 2016-05-19 MED ORDER — CEPHALEXIN 500 MG PO CAPS: 500.0000 mg | ORAL_CAPSULE | Freq: Four times a day (QID) | ORAL | 0 refills | Status: DC

## 2016-05-19 MED ORDER — HALOPERIDOL LACTATE 5 MG/ML IJ SOLN
2.0000 mg | Freq: Once | INTRAMUSCULAR | Status: AC
  Administered 2016-05-19: 2 mg via INTRAVENOUS
  Filled 2016-05-19: qty 1

## 2016-05-19 MED ORDER — KETOROLAC TROMETHAMINE 30 MG/ML IJ SOLN
30.0000 mg | Freq: Once | INTRAMUSCULAR | Status: AC
  Administered 2016-05-19: 30 mg via INTRAVENOUS
  Filled 2016-05-19: qty 1

## 2016-05-19 NOTE — ED Notes (Signed)
Pt was given sprite for po challenge.

## 2016-05-19 NOTE — ED Provider Notes (Signed)
Received patient care at the beginning of shift from signout. Patient with significant history of PID, pyelonephritis and gallstone present with contents of left flank pain for approximately 1 week. Her urine demonstrate has signs of urinary tract infection. Her blood work has been reassuring. However, prior to discharge patient endorse nausea and vomiting. Currently awaits an abdominal and pelvis CT scan to rule out acute emergent pathology. Anticipate discharge if CT scan is unremarkable and patient is able to tolerates by mouth.  9:15 AM Abdominal and pelvis CT scan without acute finding. Patient tolerates by mouth. She'll be discharged with antibiotic, antinausea medication, and outpatient follow-up. Return precaution discussed.  BP (!) 155/104 (BP Location: Right Arm)   Pulse 71   Temp 98.4 F (36.9 C) (Oral)   Resp 12   Ht 5\' 5"  (1.651 m)   Wt 122 kg   SpO2 100%   BMI 44.76 kg/m   Results for orders placed or performed during the hospital encounter of 05/19/16  Wet prep, genital  Result Value Ref Range   Yeast Wet Prep HPF POC NONE SEEN NONE SEEN   Trich, Wet Prep NONE SEEN NONE SEEN   Clue Cells Wet Prep HPF POC NONE SEEN NONE SEEN   WBC, Wet Prep HPF POC FEW (A) NONE SEEN   Sperm NONE SEEN   Urinalysis, Routine w reflex microscopic- may I&O cath if menses  Result Value Ref Range   Color, Urine YELLOW YELLOW   APPearance HAZY (A) CLEAR   Specific Gravity, Urine 1.013 1.005 - 1.030   pH 6.0 5.0 - 8.0   Glucose, UA NEGATIVE NEGATIVE mg/dL   Hgb urine dipstick NEGATIVE NEGATIVE   Bilirubin Urine NEGATIVE NEGATIVE   Ketones, ur NEGATIVE NEGATIVE mg/dL   Protein, ur NEGATIVE NEGATIVE mg/dL   Nitrite POSITIVE (A) NEGATIVE   Leukocytes, UA TRACE (A) NEGATIVE   RBC / HPF 0-5 0 - 5 RBC/hpf   WBC, UA 0-5 0 - 5 WBC/hpf   Bacteria, UA RARE (A) NONE SEEN   Squamous Epithelial / LPF 0-5 (A) NONE SEEN   Mucous PRESENT   Comprehensive metabolic panel  Result Value Ref Range   Sodium 136 135 - 145 mmol/L   Potassium 4.1 3.5 - 5.1 mmol/L   Chloride 103 101 - 111 mmol/L   CO2 27 22 - 32 mmol/L   Glucose, Bld 99 65 - 99 mg/dL   BUN 14 6 - 20 mg/dL   Creatinine, Ser 0.95 0.44 - 1.00 mg/dL   Calcium 9.1 8.9 - 10.3 mg/dL   Total Protein 7.8 6.5 - 8.1 g/dL   Albumin 3.8 3.5 - 5.0 g/dL   AST 18 15 - 41 U/L   ALT 17 14 - 54 U/L   Alkaline Phosphatase 89 38 - 126 U/L   Total Bilirubin 0.3 0.3 - 1.2 mg/dL   GFR calc non Af Amer >60 >60 mL/min   GFR calc Af Amer >60 >60 mL/min   Anion gap 6 5 - 15  CBC with Differential  Result Value Ref Range   WBC 8.5 4.0 - 10.5 K/uL   RBC 4.91 3.87 - 5.11 MIL/uL   Hemoglobin 12.2 12.0 - 15.0 g/dL   HCT 36.9 36.0 - 46.0 %   MCV 75.2 (L) 78.0 - 100.0 fL   MCH 24.8 (L) 26.0 - 34.0 pg   MCHC 33.1 30.0 - 36.0 g/dL   RDW 15.2 11.5 - 15.5 %   Platelets 325 150 - 400 K/uL   Neutrophils  Relative % 66 %   Neutro Abs 5.7 1.7 - 7.7 K/uL   Lymphocytes Relative 22 %   Lymphs Abs 1.8 0.7 - 4.0 K/uL   Monocytes Relative 8 %   Monocytes Absolute 0.7 0.1 - 1.0 K/uL   Eosinophils Relative 4 %   Eosinophils Absolute 0.3 0.0 - 0.7 K/uL   Basophils Relative 0 %   Basophils Absolute 0.0 0.0 - 0.1 K/uL  Urine rapid drug screen (hosp performed)  Result Value Ref Range   Opiates NONE DETECTED NONE DETECTED   Cocaine NONE DETECTED NONE DETECTED   Benzodiazepines NONE DETECTED NONE DETECTED   Amphetamines NONE DETECTED NONE DETECTED   Tetrahydrocannabinol NONE DETECTED NONE DETECTED   Barbiturates NONE DETECTED NONE DETECTED  POC urine preg, ED  Result Value Ref Range   Preg Test, Ur NEGATIVE NEGATIVE   Ct Renal Stone Study  Result Date: 05/19/2016 CLINICAL DATA:  LEFT flank pain for a few days. EXAM: CT ABDOMEN AND PELVIS WITHOUT CONTRAST TECHNIQUE: Multidetector CT imaging of the abdomen and pelvis was performed following the standard protocol without IV contrast. COMPARISON:  CT of the abdomen and pelvis 01/06/2016. FINDINGS: Lower  chest: No acute abnormality. Hepatobiliary: No focal liver abnormality is seen. No gallstones, gallbladder wall thickening, or biliary dilatation. Pancreas: Unremarkable. No pancreatic ductal dilatation or surrounding inflammatory changes. Spleen: Normal in size without focal abnormality. Adrenals/Urinary Tract: Adrenal glands are unremarkable. Kidneys are normal, without renal calculi, focal lesion, or hydronephrosis. Bladder is unremarkable. Stable subcentimeter parenchymal cysts in both kidneys. Stomach/Bowel: Stomach is within normal limits. Appendix appears normal. No evidence of bowel wall thickening, distention, or inflammatory changes. Vascular/Lymphatic: No significant vascular findings are present. No enlarged abdominal or pelvic lymph nodes. Reproductive: Uterus and bilateral adnexa are unremarkable. Other: No abdominal wall hernia or abnormality. No abdominopelvic ascites. Musculoskeletal: No acute or significant osseous findings. Compared with prior study, the LEFT ureteral thickening has improved. IMPRESSION: Negative exam. Electronically Signed   By: Staci Righter M.D.   On: 05/19/2016 08:44      [name], PA-C 05/19/16 [id number]    Daleen Bo, MD 05/19/16 1524

## 2016-05-19 NOTE — ED Notes (Signed)
Patient vomiting after taking PO antibiotics. Made Jamie PA.

## 2016-05-19 NOTE — ED Provider Notes (Signed)
[facility] DEPT Provider Note   CSN: [id number] Arrival date & time: 05/18/16  2130  By signing my name below, I, Sabrina Harding, attest that this documentation has been prepared under the direction and in the presence of [facility], PA-C. Electronically Signed: Dora Harding, Scribe. 05/19/2016. 12:54 AM.  History   Chief Complaint Chief Complaint  Patient presents with  . Flank Pain    The history is provided by the patient. No language interpreter was used.     HPI Comments: Sabrina Harding is a 36 y.o. female with PMHx significant for gallstones, PID, and pyelonephritis who presents to the Emergency Department complaining of constant, gradually worsening, left flank/left lower back pain for approx. 1 week. She reports some associated difficulty urinating, nausea, vomiting, suprapubic abdominal pain, and a decreased appetite. Pt states she has regularly been vomiting post-prandially since onset of her flank/back pain and is only able to retain some food and fluids. She last vomited on 2/9. Pt endorses pain exacerbation with applied pressure to her left flank/left lower back/suprapubic abdomen. She states she has consistently felt thirsty since onset of her flank pain. No h/o kidney stones. She denies fever, chills, dysuria, urinary frequency, vaginal discharge, or any other associated symptoms.  Past Medical History:  Diagnosis Date  . Anemia   . Gall stones   . Gonorrhea   . Headache(784.0)   . Heart murmur    childhood  . Trichomonas contact     Patient Active Problem List   Diagnosis Date Noted  . Oral herpes 01/10/2016  . Pelvic inflammatory disease (PID) 01/07/2016  . Gonorrhea in female 01/07/2016  . Pyelonephritis 01/06/2016    Past Surgical History:  Procedure Laterality Date  . CHOLECYSTECTOMY  10/17/2011   Procedure: LAPAROSCOPIC CHOLECYSTECTOMY;  Surgeon: Harl Bowie, MD;  Location: [facility];  Service: General;  Laterality: N/A;  . TONSILLECTOMY     . TUBAL LIGATION      OB History    Gravida Para Term Preterm AB Living   6 0 0   3 3   SAB TAB Ectopic Multiple Live Births   3       3       Home Medications    Prior to Admission medications   Medication Sig Start Date End Date Taking? Authorizing Provider  acyclovir (ZOVIRAX) 400 MG tablet Take 1 tablet (400 mg total) by mouth 5 (five) times daily. Patient not taking: Reported on 05/19/2016 01/10/16   Sabrina Angela Burke, MD  cephALEXin (KEFLEX) 500 MG capsule Take 1 capsule (500 mg total) by mouth 4 (four) times daily. 05/19/16   Ozella Almond Manveer Gomes, PA-C  doxycycline (VIBRAMYCIN) 100 MG capsule Take 1 capsule (100 mg total) by mouth 2 (two) times daily. 05/19/16   Ozella Almond Khalaya Mcgurn, PA-C  metroNIDAZOLE (FLAGYL) 500 MG tablet Take 1 tablet (500 mg total) by mouth 2 (two) times daily. Patient not taking: Reported on 05/19/2016 01/10/16   Sabrina Angela Burke, MD  ondansetron (ZOFRAN ODT) 4 MG disintegrating tablet Take 1 tablet (4 mg total) by mouth every 8 (eight) hours as needed for nausea or vomiting. 05/19/16   Ozella Almond Hezzie Karim, PA-C  sulfamethoxazole-trimethoprim (BACTRIM DS,SEPTRA DS) 800-160 MG tablet Take 1 tablet by mouth 2 (two) times daily. Patient not taking: Reported on 05/19/2016 01/10/16   Florinda Marker, MD    Family History Family History  Problem Relation Age of Onset  . Hypertension Father     Social History Social History  Substance  Use Topics  . Smoking status: Current Every Day Smoker    Packs/day: 0.50    Years: 8.00    Types: Cigarettes  . Smokeless tobacco: Never Used  . Alcohol use 0.0 oz/week     Comment: occasional     Allergies   Patient has no known allergies.   Review of Systems Review of Systems  Constitutional: Positive for appetite change (decreased). Negative for chills and fever.  Gastrointestinal: Positive for abdominal pain, nausea and vomiting.  Genitourinary: Positive for difficulty urinating and flank pain (L). Negative for dysuria,  frequency and vaginal discharge.  Musculoskeletal: Positive for back pain.  All other systems reviewed and are negative.    Physical Exam Updated Vital Signs BP 120/79   Pulse 63   Temp 98.4 F (36.9 C) (Oral)   Resp 18   Ht 5\' 5"  (1.651 m)   Wt 122 kg   SpO2 96%   BMI 44.76 kg/m   Physical Exam  Constitutional: She is oriented to person, place, and time. She appears well-developed and well-nourished. No distress.  HENT:  Head: Normocephalic and atraumatic.  Eyes: Conjunctivae and EOM are normal.  Neck: Neck supple. No tracheal deviation present.  Cardiovascular: Normal rate, regular rhythm, normal heart sounds and intact distal pulses.  Exam reveals no gallop and no friction rub.   No murmur heard. Pulmonary/Chest: Effort normal and breath sounds normal. No respiratory distress. She has no wheezes. She has no rales. She exhibits no tenderness.  Abdominal: Soft. Bowel sounds are normal. She exhibits no distension. There is tenderness. There is no rebound and no guarding.  Tenderness to palpation along left flank, left lower quadrant and suprapubic area. Positive left CVA tenderness. No right CVA tenderness.  Genitourinary:  Genitourinary Comments: Chaperone present for exam. + white vaginal discharge. No CMT. No adnexal masses, tenderness or fullness. No bleeding within vaginal vault.   Musculoskeletal: Normal range of motion.  Neurological: She is alert and oriented to person, place, and time.  Skin: Skin is warm and dry.  Psychiatric: She has a normal mood and affect. Her behavior is normal.  Nursing note and vitals reviewed.    ED Treatments / Results  Labs (all labs ordered are listed, but only abnormal results are displayed) Labs Reviewed  WET PREP, GENITAL - Abnormal; Notable for the following:       Result Value   WBC, Wet Prep HPF POC FEW (*)    All other components within normal limits  URINALYSIS, ROUTINE W REFLEX MICROSCOPIC - Abnormal; Notable for the  following:    APPearance HAZY (*)    Nitrite POSITIVE (*)    Leukocytes, UA TRACE (*)    Bacteria, UA RARE (*)    Squamous Epithelial / LPF 0-5 (*)    All other components within normal limits  CBC WITH DIFFERENTIAL/PLATELET - Abnormal; Notable for the following:    MCV 75.2 (*)    MCH 24.8 (*)    All other components within normal limits  URINE CULTURE  COMPREHENSIVE METABOLIC PANEL  RAPID URINE DRUG SCREEN, HOSP PERFORMED  POC URINE PREG, ED  GC/CHLAMYDIA PROBE AMP ([place]) NOT AT [facility]    EKG  EKG Interpretation None       Radiology No results found.  Procedures Procedures (including critical care time)  DIAGNOSTIC STUDIES: Oxygen Saturation is 100% on RA, normal by my interpretation.    COORDINATION OF CARE: 1:04 AM Discussed treatment plan with pt at bedside and pt agreed to plan.  Medications Ordered in ED Medications  haloperidol lactate (HALDOL) injection 2 mg (not administered)  sodium chloride 0.9 % bolus 1,000 mL (0 mLs Intravenous Stopped 05/19/16 0503)  ketorolac (TORADOL) 30 MG/ML injection 30 mg (30 mg Intravenous Given 05/19/16 0118)  cefTRIAXone (ROCEPHIN) 1 g in dextrose 5 % 50 mL IVPB (0 g Intravenous Stopped 05/19/16 0300)  oxyCODONE-acetaminophen (PERCOCET/ROXICET) 5-325 MG per tablet 1 tablet (1 tablet Oral Given 05/19/16 0310)  ondansetron (ZOFRAN-ODT) disintegrating tablet 8 mg (8 mg Oral Given 05/19/16 0407)  sodium chloride 0.9 % bolus 1,000 mL (1,000 mLs Intravenous New Bag/Given 05/19/16 0641)  promethazine (PHENERGAN) injection 12.5 mg (12.5 mg Intravenous Given 05/19/16 0707)  azithromycin (ZITHROMAX) tablet 1,000 mg (1,000 mg Oral Given 05/19/16 0732)     Initial Impression / Assessment and Plan / ED Course  I have reviewed the triage vital signs and the nursing notes.  Pertinent labs & imaging results that were available during my care of the patient were reviewed by me and considered in my medical decision making (see chart for  details).    Eyva N Kittrell is a 36 y.o. female who presents to ED for worsening left sided back/flank pain x 1 week. On exam, patient is afebrile, VSS. Pelvic with vaginal discharge. No cervical motion or adnexal tenderness. Non-surgical abdomen. CMP negative. CBC with normal white count. UA nitrite positive. Trace leuks, 0-5 WBC, rare bacteria. Wet prep shows few WBC's and otherwise negative. Given amount of vaginal discharge and odor appreciated on exam, I am still concerned this may be contributory. Urine sent for culture. G&C obtained and patient aware she will be notified if results are positive. Rocephin and azithro given in ED.   7:52 AM - Nursing staff informed me that patient has thrown up azithromycin. Given continued vomiting. Will obtain CT scan for further evaluation. Haldol also given for nausea. Imaging pending at discharge. Care assumed by oncoming provider PA Rona Ravens. Case discussed, plan agreed upon. Ensure patient is able to tolerate PO. Will follow up on CT and dispo appropriately.    Final Clinical Impressions(s) / ED Diagnoses   Final diagnoses:  Flank pain  Acute cystitis without hematuria  Vaginal discharge    New Prescriptions  I personally performed the services described in this documentation, which was scribed in my presence. The recorded information has been reviewed and is accurate.    Ozella Almond Zannie Locastro, PA-C 05/19/16 [id number]    April Palumbo, MD 05/22/16 2326

## 2016-05-19 NOTE — Discharge Instructions (Signed)
It was my pleasure taking care of you today!  Please take all of your antibiotics until finished! Zofran as needed for nausea.  You have been tested for gonorrhea and chlamydia. You will be notified in approximately 3 days if these results are positive.  Please call the OBGYN clinic listed to schedule a follow up appointment.  Return to ER if you are unable to keep down fluids, develop a fever, new or worsening symptoms develop, any additional concerns.

## 2016-05-20 LAB — GC/CHLAMYDIA PROBE AMP (~~LOC~~) NOT AT ARMC
Chlamydia: NEGATIVE
Neisseria Gonorrhea: NEGATIVE

## 2016-05-21 LAB — URINE CULTURE: Culture: >=100000 — AB

## 2016-05-22 ENCOUNTER — Telehealth: Payer: Self-pay | Admitting: Emergency Medicine

## 2016-05-22 NOTE — Telephone Encounter (Signed)
Post ED Visit - Positive Culture Follow-up  Culture report reviewed by antimicrobial stewardship pharmacist:  []  Elenor Quinones, Pharm.D. [x]  Heide Guile, Pharm.D., BCPS []  Parks Neptune, Pharm.D. []  Alycia Rossetti, Pharm.D., BCPS []  Harman, Pharm.D., BCPS, AAHIVP []  Legrand Como, Pharm.D., BCPS, AAHIVP []  Milus Glazier, Pharm.D. []  Rob Evette Doffing, Pharm.D.  Positive urine culture Treated with doxycycline and cephalexin, organism sensitive to the same and no further patient follow-up is required at this time.  Hazle Nordmann 05/22/2016, 11:01 AM

## 2017-07-16 ENCOUNTER — Encounter (HOSPITAL_COMMUNITY): Payer: Self-pay | Admitting: *Deleted

## 2017-07-16 ENCOUNTER — Other Ambulatory Visit: Payer: Self-pay

## 2017-07-16 ENCOUNTER — Emergency Department (HOSPITAL_COMMUNITY)
Admission: EM | Admit: 2017-07-16 | Discharge: 2017-07-17 | Disposition: A | Discharge status: Home / Self Care | Payer: Self-pay | Attending: Emergency Medicine | Admitting: Emergency Medicine

## 2017-07-16 ENCOUNTER — Emergency Department (HOSPITAL_COMMUNITY): Payer: Self-pay

## 2017-07-16 DIAGNOSIS — R1032 Left lower quadrant pain: Secondary | ICD-10-CM | POA: Insufficient documentation

## 2017-07-16 DIAGNOSIS — F17210 Nicotine dependence, cigarettes, uncomplicated: Secondary | ICD-10-CM | POA: Insufficient documentation

## 2017-07-16 DIAGNOSIS — Z79899 Other long term (current) drug therapy: Secondary | ICD-10-CM | POA: Insufficient documentation

## 2017-07-16 DIAGNOSIS — Z9049 Acquired absence of other specified parts of digestive tract: Secondary | ICD-10-CM | POA: Insufficient documentation

## 2017-07-16 DIAGNOSIS — K5900 Constipation, unspecified: Secondary | ICD-10-CM | POA: Insufficient documentation

## 2017-07-16 DIAGNOSIS — B9689 Other specified bacterial agents as the cause of diseases classified elsewhere: Secondary | ICD-10-CM

## 2017-07-16 DIAGNOSIS — R109 Unspecified abdominal pain: Secondary | ICD-10-CM

## 2017-07-16 DIAGNOSIS — N760 Acute vaginitis: Secondary | ICD-10-CM | POA: Insufficient documentation

## 2017-07-16 DIAGNOSIS — R110 Nausea: Secondary | ICD-10-CM | POA: Insufficient documentation

## 2017-07-16 LAB — COMPREHENSIVE METABOLIC PANEL
ALT: 15 U/L (ref 14–54)
AST: 16 U/L (ref 15–41)
Albumin: 3.5 g/dL (ref 3.5–5.0)
Alkaline Phosphatase: 89 U/L (ref 38–126)
Anion gap: 7 (ref 5–15)
BILIRUBIN TOTAL: 0.4 mg/dL (ref 0.3–1.2)
BUN: 12 mg/dL (ref 6–20)
CO2: 27 mmol/L (ref 22–32)
CREATININE: 0.9 mg/dL (ref 0.44–1.00)
Calcium: 9.3 mg/dL (ref 8.9–10.3)
Chloride: 106 mmol/L (ref 101–111)
Glucose, Bld: 94 mg/dL (ref 65–99)
Potassium: 4.3 mmol/L (ref 3.5–5.1)
Sodium: 140 mmol/L (ref 135–145)
TOTAL PROTEIN: 7.4 g/dL (ref 6.5–8.1)

## 2017-07-16 LAB — CBC WITH DIFFERENTIAL/PLATELET
BASOS ABS: 0 10*3/uL (ref 0.0–0.1)
Basophils Relative: 0 %
Eosinophils Absolute: 0.4 10*3/uL (ref 0.0–0.7)
Eosinophils Relative: 5 %
HEMATOCRIT: 38.8 % (ref 36.0–46.0)
Hemoglobin: 12.5 g/dL (ref 12.0–15.0)
LYMPHS ABS: 2.4 10*3/uL (ref 0.7–4.0)
LYMPHS PCT: 31 %
MCH: 25 pg — AB (ref 26.0–34.0)
MCHC: 32.2 g/dL (ref 30.0–36.0)
MCV: 77.6 fL — AB (ref 78.0–100.0)
Monocytes Absolute: 0.5 10*3/uL (ref 0.1–1.0)
Monocytes Relative: 7 %
NEUTROS ABS: 4.5 10*3/uL (ref 1.7–7.7)
Neutrophils Relative %: 57 %
Platelets: 361 10*3/uL (ref 150–400)
RBC: 5 MIL/uL (ref 3.87–5.11)
RDW: 15.3 % (ref 11.5–15.5)
WBC: 7.7 10*3/uL (ref 4.0–10.5)

## 2017-07-16 LAB — WET PREP, GENITAL
SPERM: NONE SEEN
Trich, Wet Prep: NONE SEEN
YEAST WET PREP: NONE SEEN

## 2017-07-16 LAB — LIPASE, BLOOD: LIPASE: 22 U/L (ref 11–51)

## 2017-07-16 LAB — URINALYSIS, ROUTINE W REFLEX MICROSCOPIC
Bilirubin Urine: NEGATIVE
GLUCOSE, UA: NEGATIVE mg/dL
Ketones, ur: NEGATIVE mg/dL
NITRITE: NEGATIVE
PROTEIN: NEGATIVE mg/dL
SPECIFIC GRAVITY, URINE: 1.011 (ref 1.005–1.030)
pH: 6 (ref 5.0–8.0)

## 2017-07-16 LAB — PREGNANCY, URINE: PREG TEST UR: NEGATIVE

## 2017-07-16 MED ORDER — IOPAMIDOL (ISOVUE-300) INJECTION 61%
INTRAVENOUS | Status: AC
  Filled 2017-07-16: qty 100

## 2017-07-16 MED ORDER — ONDANSETRON HCL 4 MG/2ML IJ SOLN
4.0000 mg | Freq: Once | INTRAMUSCULAR | Status: AC
  Administered 2017-07-16: 4 mg via INTRAVENOUS
  Filled 2017-07-16: qty 2

## 2017-07-16 MED ORDER — IOPAMIDOL (ISOVUE-300) INJECTION 61%
100.0000 mL | Freq: Once | INTRAVENOUS | Status: AC | PRN
  Administered 2017-07-16: 100 mL via INTRAVENOUS

## 2017-07-16 MED ORDER — ONDANSETRON 4 MG PO TBDP: ORAL_TABLET | ORAL | 0 refills | Status: DC

## 2017-07-16 MED ORDER — DOCUSATE SODIUM 250 MG PO CAPS: 250.0000 mg | ORAL_CAPSULE | Freq: Every day | ORAL | 0 refills | Status: DC

## 2017-07-16 MED ORDER — MORPHINE SULFATE (PF) 4 MG/ML IV SOLN
4.0000 mg | Freq: Once | INTRAVENOUS | Status: AC
  Administered 2017-07-16: 4 mg via INTRAVENOUS
  Filled 2017-07-16: qty 1

## 2017-07-16 MED ORDER — METRONIDAZOLE 500 MG PO TABS: 500.0000 mg | ORAL_TABLET | Freq: Two times a day (BID) | ORAL | 0 refills | Status: DC

## 2017-07-16 MED ORDER — SODIUM CHLORIDE 0.9 % IJ SOLN
INTRAMUSCULAR | Status: AC
  Filled 2017-07-16: qty 50

## 2017-07-16 MED ORDER — POLYETHYLENE GLYCOL 3350 17 G PO PACK: 17.0000 g | PACK | Freq: Every day | ORAL | 0 refills | Status: DC

## 2017-07-16 MED ORDER — SODIUM CHLORIDE 0.9 % IV BOLUS
1000.0000 mL | Freq: Once | INTRAVENOUS | Status: AC
  Administered 2017-07-16: 1000 mL via INTRAVENOUS

## 2017-07-16 NOTE — Discharge Instructions (Signed)
Your evaluation is overall very reassuring, labs look good there is no evidence of urinary tract infection, kidney infection, kidney stone or pelvic inflammatory disease.  It did show some constipation and your wet prep showed bacterial vaginosis.  Please take Flagyl twice daily for the next week, do not drink alcohol while taking this medication.  You can use MiraLAX and/or Colace to help with moving her bowels.  You may use Zofran as needed for nausea.  Please follow-up with the [facility].  Return to the emergency department for worsening abdominal pain, fevers or chills, persistent nausea vomiting unable to keep down fluids or other new or concerning symptoms.

## 2017-07-16 NOTE — ED Provider Notes (Signed)
[place] DEPT Provider Note   CSN: [id number] Arrival date & time: 07/16/17  1859     History   Chief Complaint Chief Complaint  Patient presents with  . Flank Pain    L    HPI [name] is a 37 y.o. female.  Sabrina Harding is a 37 y.o. Female with a history of pyelonephritis, PID, and headaches, who presents to the ED for evaluation of 4 days of worsening left flank pain.  Patient reports pain started over the weekend, was initially intermittent but has become a more constant pain over the left flank and left lower abdomen.  She denies any aggravating or alleviating factors.  She has not taken anything at home to try and treat her symptoms.  She reports this feels exactly like the pain she had when she had a previous kidney infection requiring her to be admitted to the hospital.  She denies history of kidney stones.  She reports nausea with a few episodes of vomiting, denies hematemesis.  No diarrhea, melena or hematochezia.  Patient reports some chills but no fevers.  No chest pain or shortness of breath.  She reports she is sexually active, denies vaginal discharge or vaginal bleeding.  She denies dysuria, frequency, hematuria.  But patient reports she did not have any of the symptoms with her prior kidney infection.  She reports feeling generally weak and tired over the past few days.     Past Medical History:  Diagnosis Date  . Anemia   . Gall stones   . Gonorrhea   . Headache(784.0)   . Heart murmur    childhood  . Trichomonas contact     Patient Active Problem List   Diagnosis Date Noted  . Oral herpes 01/10/2016  . Pelvic inflammatory disease (PID) 01/07/2016  . Gonorrhea in female 01/07/2016  . Pyelonephritis 01/06/2016    Past Surgical History:  Procedure Laterality Date  . CHOLECYSTECTOMY  10/17/2011   Procedure: LAPAROSCOPIC CHOLECYSTECTOMY;  Surgeon: Harl Bowie, MD;  Location: [facility];  Service:  General;  Laterality: N/A;  . TONSILLECTOMY    . TUBAL LIGATION       OB History    Gravida  6   Para  0   Term  0   Preterm      AB  3   Living  3     SAB  3   TAB      Ectopic      Multiple      Live Births  3            Home Medications    Prior to Admission medications   Medication Sig Start Date End Date Taking? Authorizing Provider  acyclovir (ZOVIRAX) 400 MG tablet Take 1 tablet (400 mg total) by mouth 5 (five) times daily. Patient not taking: Reported on 05/19/2016 01/10/16   Florinda Marker, MD  cephALEXin (KEFLEX) 500 MG capsule Take 1 capsule (500 mg total) by mouth 4 (four) times daily. 05/19/16   Ward, Ozella Almond, PA-C  doxycycline (VIBRAMYCIN) 100 MG capsule Take 1 capsule (100 mg total) by mouth 2 (two) times daily. 05/19/16   Ward, Ozella Almond, PA-C  metroNIDAZOLE (FLAGYL) 500 MG tablet Take 1 tablet (500 mg total) by mouth 2 (two) times daily. Patient not taking: Reported on 05/19/2016 01/10/16   Florinda Marker, MD  ondansetron (ZOFRAN ODT) 4 MG disintegrating tablet Take 1 tablet (4 mg total) by mouth every  8 (eight) hours as needed for nausea or vomiting. 05/19/16   Ward, Ozella Almond, PA-C  sulfamethoxazole-trimethoprim (BACTRIM DS,SEPTRA DS) 800-160 MG tablet Take 1 tablet by mouth 2 (two) times daily. Patient not taking: Reported on 05/19/2016 01/10/16   Florinda Marker, MD    Family History Family History  Problem Relation Age of Onset  . Hypertension Father     Social History Social History   Tobacco Use  . Smoking status: Current Every Day Smoker    Packs/day: 0.50    Years: 8.00    Pack years: 4.00    Types: Cigarettes  . Smokeless tobacco: Never Used  Substance Use Topics  . Alcohol use: Yes    Alcohol/week: 0.0 oz    Comment: occasional  . Drug use: No     Allergies   Patient has no known allergies.   Review of Systems Review of Systems  Constitutional: Positive for chills. Negative for fever.  HENT: Negative for  congestion, rhinorrhea and sore throat.   Eyes: Negative for visual disturbance.  Respiratory: Negative for cough and shortness of breath.   Cardiovascular: Negative for chest pain.  Gastrointestinal: Positive for abdominal pain, nausea and vomiting. Negative for blood in stool and diarrhea.  Genitourinary: Positive for flank pain. Negative for dysuria, frequency, hematuria, pelvic pain, vaginal bleeding, vaginal discharge and vaginal pain.  Musculoskeletal: Negative for arthralgias, back pain and myalgias.  Skin: Negative for color change and rash.  Neurological: Negative for dizziness, syncope and light-headedness.     Physical Exam Updated Vital Signs BP (!) 139/96 (BP Location: Left Arm)   Pulse 86   Temp 98.8 F (37.1 C) (Oral)   Resp 18   Ht 5\' 5"  (1.651 m)   Wt 122.9 kg (271 lb)   SpO2 100%   BMI 45.10 kg/m   Physical Exam  Constitutional: She appears well-developed and well-nourished. No distress.  HENT:  Head: Normocephalic and atraumatic.  Eyes: Right eye exhibits no discharge. Left eye exhibits no discharge.  Cardiovascular: Normal rate, regular rhythm, normal heart sounds and intact distal pulses.  Pulmonary/Chest: Effort normal and breath sounds normal. No stridor. No respiratory distress. She has no wheezes. She has no rales.  Respirations equal and unlabored, patient able to speak in full sentences, lungs clear to auscultation bilaterally  Abdominal: Soft. Bowel sounds are normal. She exhibits no distension and no mass. There is tenderness. There is guarding. There is no rebound.  Abdomen soft, nondistended, there is mild tenderness in the left lower quadrant with mild guarding wrapping around the left flank with mild left-sided CVA tenderness, all other quadrants nontender to palpation, no peritoneal signs  Genitourinary:  Genitourinary Comments: Chaperone present during pelvic exam. No external genital lesions noted. Small amount of whitish gray discharge  present in the vaginal vault, no erythema or lesions noted, no evidence of cervicitis, on bimanual exam there is no cervical motion tenderness, no uterine tenderness, bilateral adnexa without tenderness or masses.  Musculoskeletal: She exhibits no edema or deformity.  Neurological: She is alert. Coordination normal.  Skin: Skin is warm and dry. Capillary refill takes less than 2 seconds. She is not diaphoretic.  Psychiatric: She has a normal mood and affect. Her behavior is normal.  Nursing note and vitals reviewed.    ED Treatments / Results  Labs (all labs ordered are listed, but only abnormal results are displayed) Labs Reviewed  WET PREP, GENITAL - Abnormal; Notable for the following components:      Result Value  Clue Cells Wet Prep HPF POC PRESENT (*)    WBC, Wet Prep HPF POC MANY (*)    All other components within normal limits  URINALYSIS, ROUTINE W REFLEX MICROSCOPIC - Abnormal; Notable for the following components:   Hgb urine dipstick SMALL (*)    Leukocytes, UA SMALL (*)    Bacteria, UA MANY (*)    Squamous Epithelial / LPF 0-5 (*)    All other components within normal limits  CBC WITH DIFFERENTIAL/PLATELET - Abnormal; Notable for the following components:   MCV 77.6 (*)    MCH 25.0 (*)    All other components within normal limits  COMPREHENSIVE METABOLIC PANEL  LIPASE, BLOOD  PREGNANCY, URINE  RPR  HIV ANTIBODY (ROUTINE TESTING)  POC URINE PREG, ED  GC/CHLAMYDIA PROBE AMP ([place]) NOT AT [facility]    EKG None  Radiology Ct Abdomen Pelvis W Contrast  Result Date: 07/16/2017 CLINICAL DATA:  Left flank and abdominal pain. History of pyelonephritis and PID. EXAM: CT ABDOMEN AND PELVIS WITH CONTRAST TECHNIQUE: Multidetector CT imaging of the abdomen and pelvis was performed using the standard protocol following bolus administration of intravenous contrast. CONTRAST:  143mL ISOVUE-300 IOPAMIDOL (ISOVUE-300) INJECTION 61% COMPARISON:  CT 05/19/2016 FINDINGS: Lower  chest: The lung bases are clear. Hepatobiliary: No focal liver abnormality is seen. Status post cholecystectomy. No biliary dilatation. Pancreas: No ductal dilatation or inflammation. Spleen: Normal in size without focal abnormality. Adrenals/Urinary Tract: Normal adrenal glands. No hydronephrosis or perinephric edema. Homogeneous renal enhancement. No renal fluid collection. Unchanged small cyst in the mid left kidney. Urinary bladder is physiologically distended without wall thickening. Stomach/Bowel: Stomach physiologically distended. No bowel inflammation, obstruction or wall thickening. Mild fecalization of distal small bowel contents. Moderate colonic stool burden. No abnormal rectal distention. Normal appendix. Vascular/Lymphatic: No significant vascular findings are present. No enlarged abdominal or pelvic lymph nodes. Reproductive: Uterus and bilateral adnexa are unremarkable. No pelvic fluid collection. No pelvic fat stranding. Other: No free air, free fluid, or intra-abdominal fluid collection. Musculoskeletal: There are no acute or suspicious osseous abnormalities. IMPRESSION: 1. Mild fecalization of distal small bowel contents with moderate colonic stool burden, can be seen with slow transit/constipation. No bowel obstruction or inflammation. 2. No CT findings of pyelonephritis or findings to suggest pelvic inflammatory disease. Electronically Signed   By: Jeb Levering M.D.   On: 07/16/2017 23:41    Procedures Procedures (including critical care time)  Medications Ordered in ED Medications  iopamidol (ISOVUE-300) 61 % injection (has no administration in time range)  sodium chloride 0.9 % injection (has no administration in time range)  sodium chloride 0.9 % bolus 1,000 mL (0 mLs Intravenous Stopped 07/16/17 2316)  ondansetron (ZOFRAN) injection 4 mg (4 mg Intravenous Given 07/16/17 2119)  morphine 4 MG/ML injection 4 mg (4 mg Intravenous Given 07/16/17 2123)  iopamidol (ISOVUE-300) 61 %  injection 100 mL (100 mLs Intravenous Contrast Given 07/16/17 2321)     Initial Impression / Assessment and Plan / ED Course  I have reviewed the triage vital signs and the nursing notes.  Pertinent labs & imaging results that were available during my care of the patient were reviewed by me and considered in my medical decision making (see chart for details).  Patient presents to the ED for several days of worsening left flank pain, feels similar to her previous kidney infection.  On initial evaluation mildly hypertensive but otherwise vitals otherwise normal and patient appears to be in no acute distress.  Abdomen with localized  tenderness in the left lower quadrant wrapping around to the left flank with mild CVA tenderness.  Pelvic exam shows small amount of whitish gray discharge, not concerning for PID.  Plan for basic labs, STD testing pending, fluids, Zofran and pain medication and CT abdomen pelvis.  Urine pregnancy negative.  No leukocytosis and normal hemoglobin, no electrolyte derangements requiring intervention normal renal and liver function, normal lipase, urine with small amount of leukocytes, not particularly concerning for infection.  Wet prep shows clue cells and WBCs.  CT abdomen pelvis shows no evidence of pyelonephritis or pelvic inflammatory disease.  There is fecalization in the distal small bowel suggestive of slow transit versus constipation, will treat with Prilosec including [place]o other acute findings on CT.  Will treat for BV with Flagyl.  Zofran for nausea.  Patient follow-up with [facility].  Return precautions discussed.  At this time she is stable for discharge home.  She expressed understanding and is in agreement with plan.   Final Clinical Impressions(s) / ED Diagnoses   Final diagnoses:  Left flank pain  Left lower quadrant pain  Nausea  Constipation, unspecified constipation type  BV (bacterial vaginosis)    ED Discharge  Orders        Ordered    docusate sodium (COLACE) 250 MG capsule  Daily     07/16/17 2350    polyethylene glycol (MIRALAX) packet  Daily     07/16/17 2350    ondansetron (ZOFRAN ODT) 4 MG disintegrating tablet     07/16/17 2350    metroNIDAZOLE (FLAGYL) 500 MG tablet  2 times daily     07/16/17 2350       Jacqlyn Larsen, PA-C 07/17/17 0031    Fatima Blank, MD 07/17/17 [phone]

## 2017-07-16 NOTE — ED Notes (Signed)
Sent down a urine culture

## 2017-07-16 NOTE — ED Triage Notes (Signed)
Pt states she is having left side pain, last time it involved kidney and she was hospitalized. Feels weak in general and this too was present last time. Little amt of n/v

## 2017-07-17 LAB — GC/CHLAMYDIA PROBE AMP (~~LOC~~) NOT AT ARMC
CHLAMYDIA, DNA PROBE: NEGATIVE
NEISSERIA GONORRHEA: NEGATIVE

## 2017-07-17 LAB — RPR: RPR: NONREACTIVE

## 2017-07-17 LAB — HIV ANTIBODY (ROUTINE TESTING W REFLEX): HIV Screen 4th Generation wRfx: NONREACTIVE

## 2017-11-22 IMAGING — US US TRANSVAGINAL NON-OB
1 series · 15 of 25 positions shown · non-contrast
Comparison: None

CLINICAL DATA: Amenorrhea



[Series 1: us transvaginal non-ob · 15 of 51 slices shown]
[im 1/51]
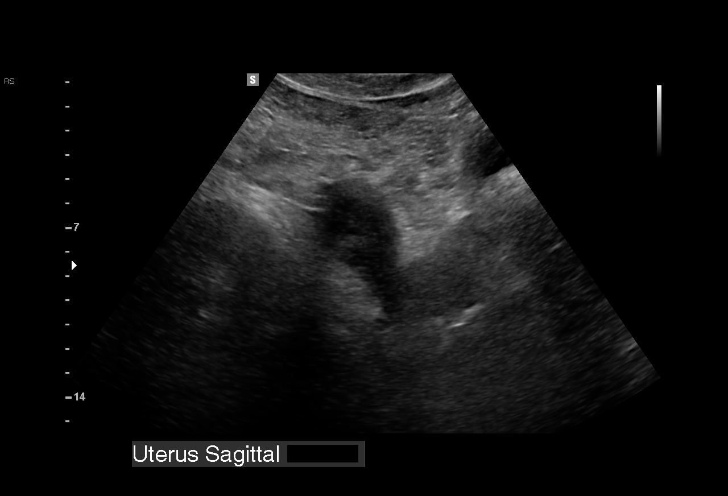
[im 5/51]
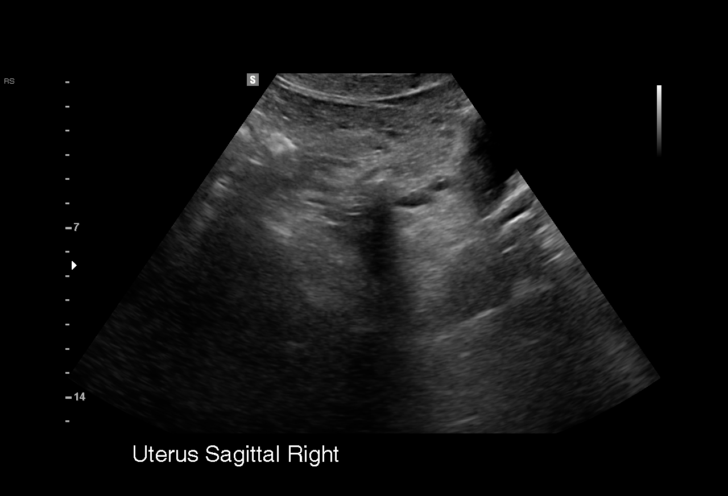
[im 9/51]
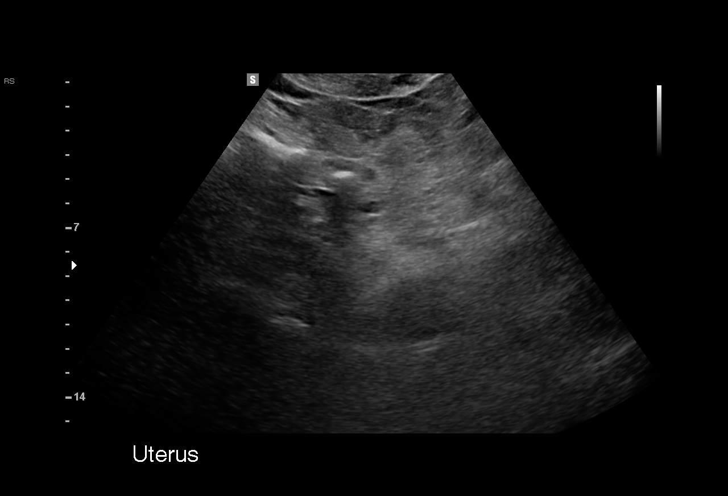
[im 11/51]
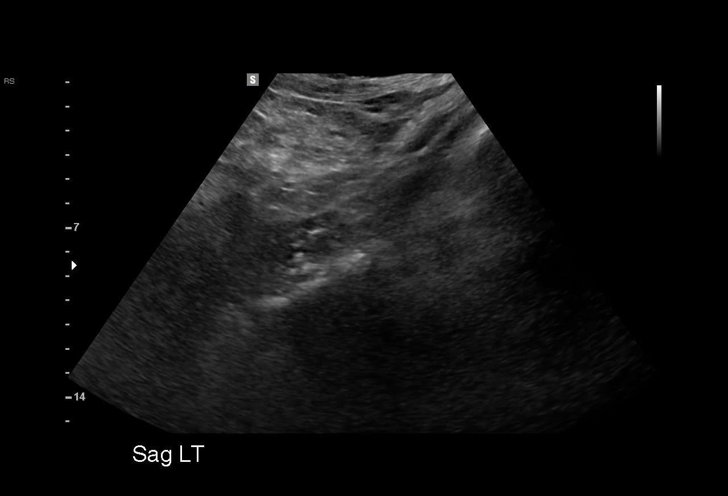
[im 15/51]
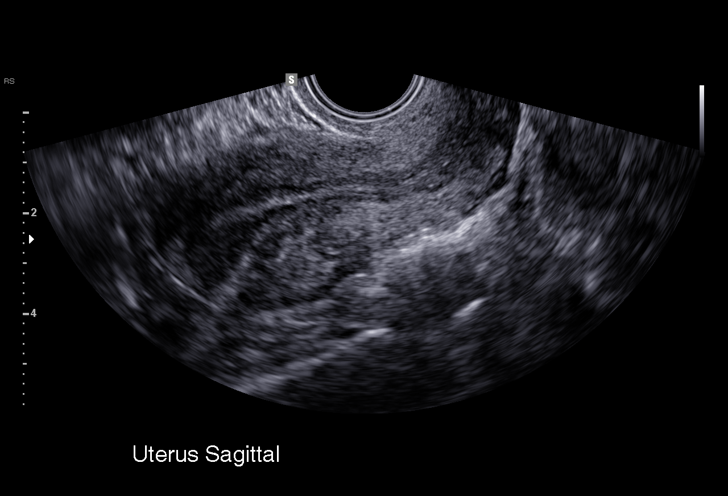
[im 19/51]
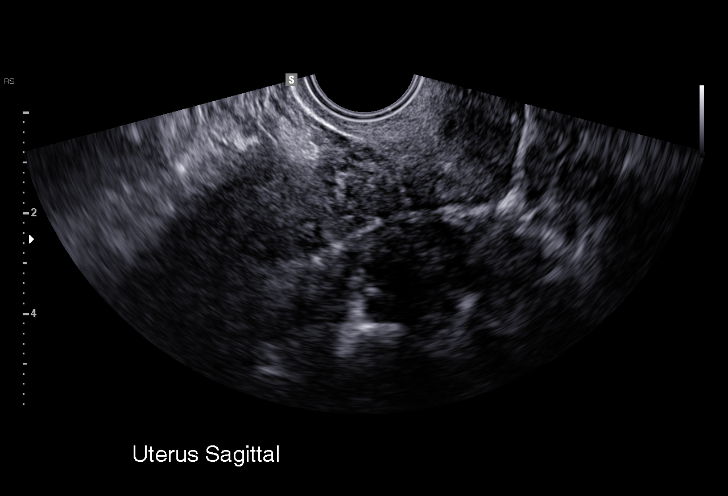
[im 21/51]
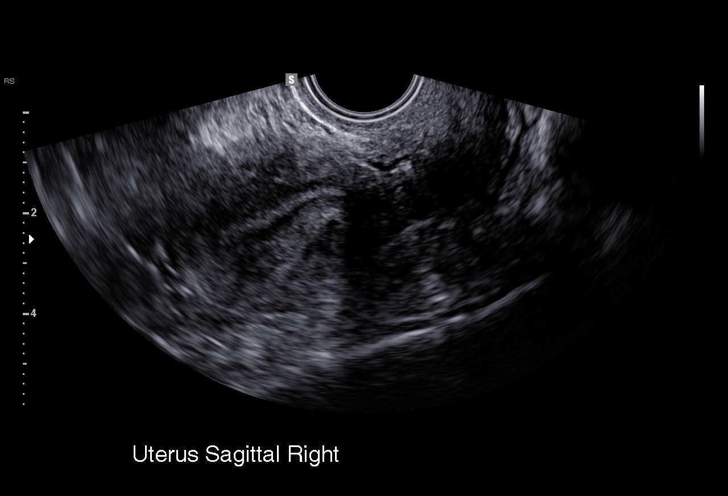
[im 26/51]
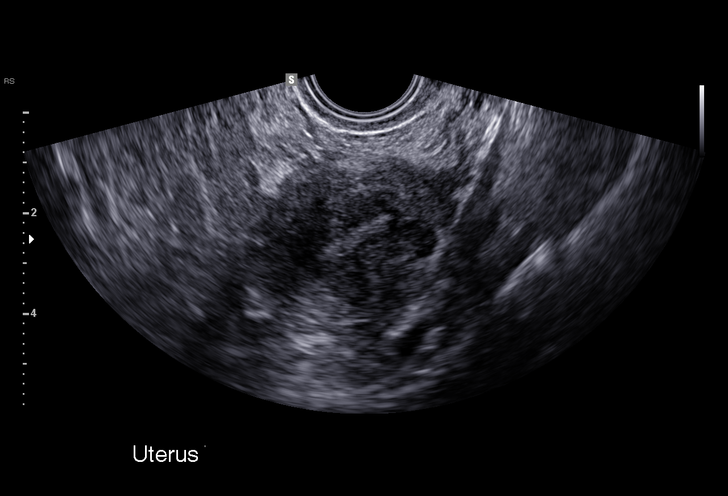
[im 30/51]
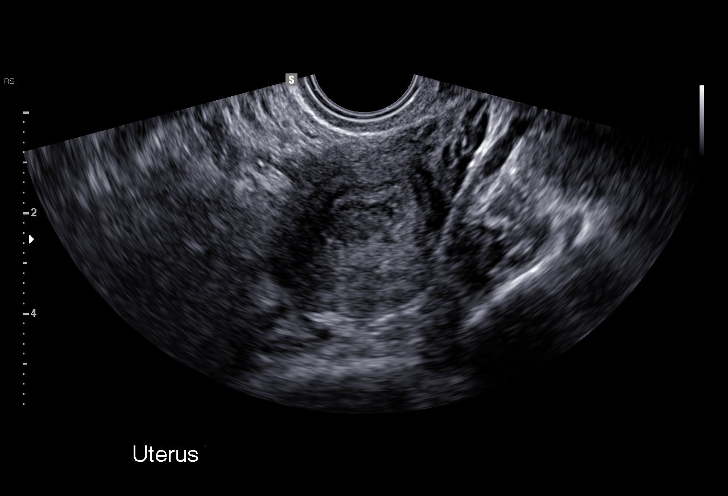
[im 32/51]
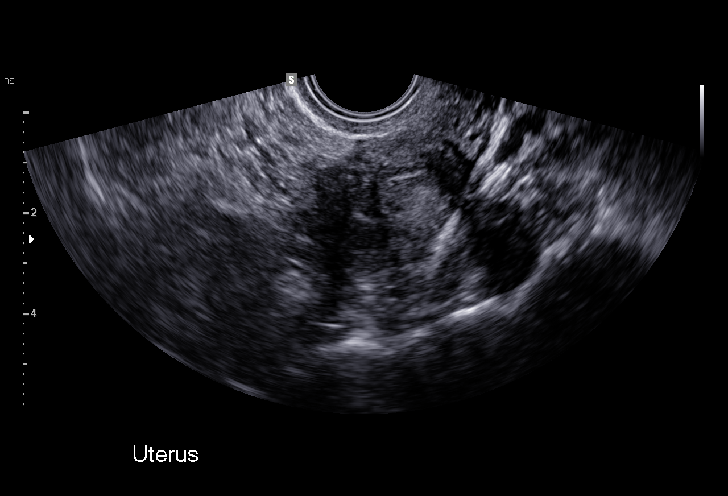
[im 36/51]
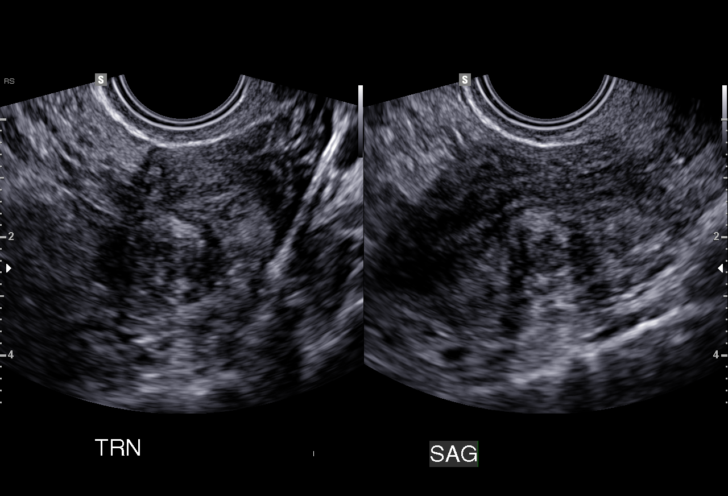
[im 40/51]
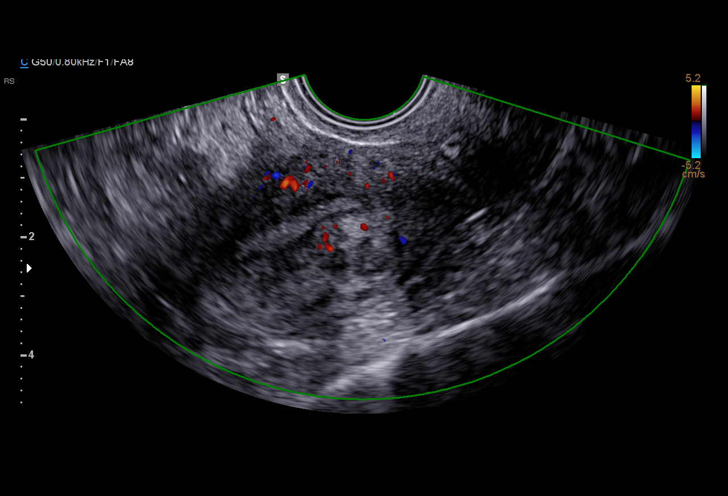
[im 42/51]
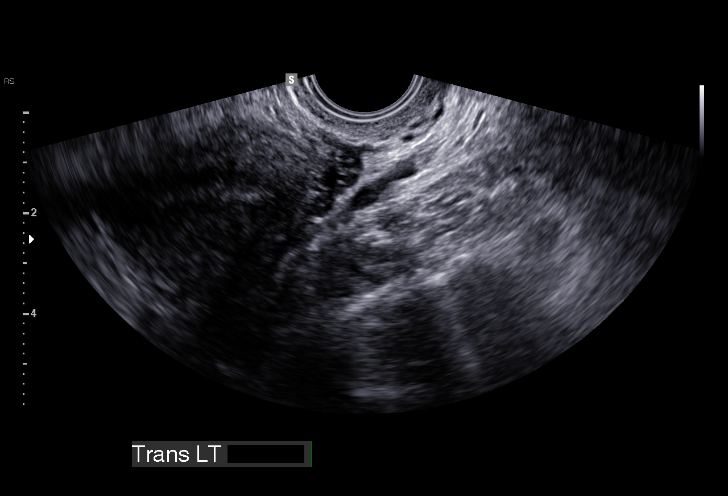
[im 46/51]
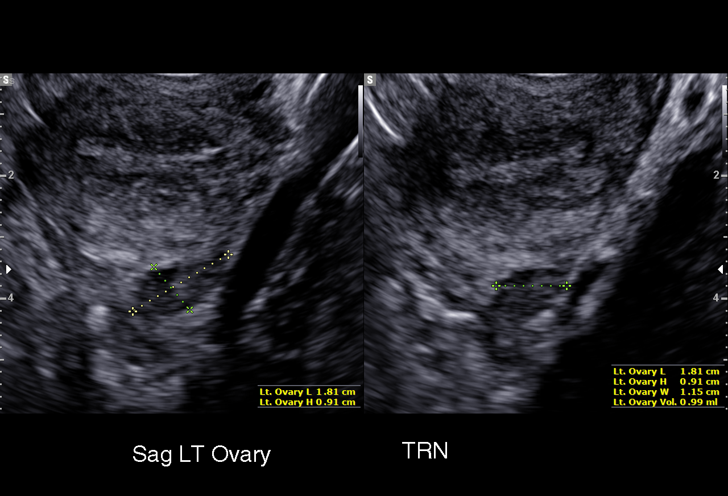
[im 51/51]
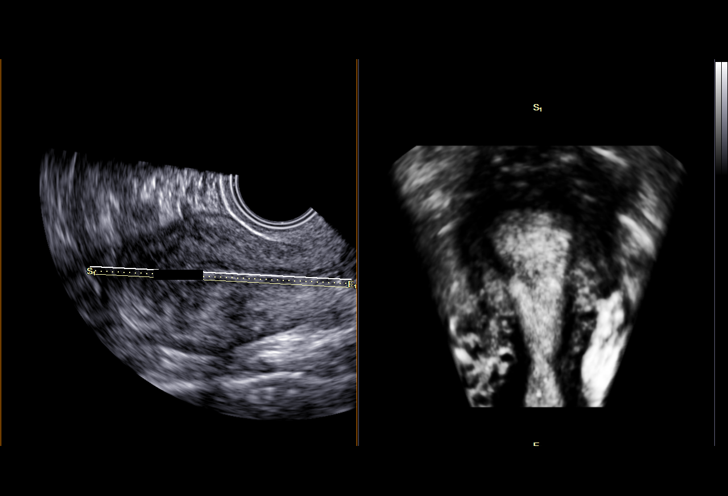

[15 of 25 positions shown; findings below may reference images not displayed]

FINDINGS: Uterus

Measurements: 7.8 x 3.7 x 4.0 cm. Myometrium is diffusely
heterogeneous. 1.7 x 1.4 x 1.3 cm deep intramural to submucosal
focal lesion is identified.

Endometrium

Thickness: 4 mm.  No focal abnormality visualized.

Right ovary

Measurements: 1.1 x 0.7 x 0.9 cm. Normal appearance/no adnexal mass.

Left ovary

Measurements: 1.8 x 0.9 x 1.2 cm. Normal appearance/no adnexal mass.

Other findings

No abnormal free fluid.
IMPRESSION: 1. Heterogeneous myometrium raises the question of the adenoma
mycosis. 1.7 cm focal lesion in the posterior uterine body may be a
deep intramural/submucosal fibroid or could potentially represent an
adenoma adenoma.
2. Normal endometrial stripe thickness with no evidence for fluid in
the endometrial cavity.

## 2018-03-01 ENCOUNTER — Other Ambulatory Visit: Payer: Self-pay

## 2018-03-01 ENCOUNTER — Encounter (HOSPITAL_COMMUNITY): Payer: Self-pay | Admitting: *Deleted

## 2018-03-01 ENCOUNTER — Emergency Department (HOSPITAL_COMMUNITY): Payer: Self-pay

## 2018-03-01 ENCOUNTER — Emergency Department (HOSPITAL_COMMUNITY)
Admission: EM | Admit: 2018-03-01 | Discharge: 2018-03-02 | Disposition: A | Discharge status: Home / Self Care | Payer: Self-pay | Attending: Emergency Medicine | Admitting: Emergency Medicine

## 2018-03-01 DIAGNOSIS — R519 Headache, unspecified: Secondary | ICD-10-CM

## 2018-03-01 DIAGNOSIS — R51 Headache: Secondary | ICD-10-CM | POA: Insufficient documentation

## 2018-03-01 DIAGNOSIS — F17210 Nicotine dependence, cigarettes, uncomplicated: Secondary | ICD-10-CM | POA: Insufficient documentation

## 2018-03-01 DIAGNOSIS — Z79899 Other long term (current) drug therapy: Secondary | ICD-10-CM | POA: Insufficient documentation

## 2018-03-01 DIAGNOSIS — R0789 Other chest pain: Secondary | ICD-10-CM | POA: Insufficient documentation

## 2018-03-01 LAB — CBC
HCT: 37.8 % (ref 36.0–46.0)
Hemoglobin: 11.3 g/dL — ABNORMAL LOW (ref 12.0–15.0)
MCH: 23.5 pg — ABNORMAL LOW (ref 26.0–34.0)
MCHC: 29.9 g/dL — ABNORMAL LOW (ref 30.0–36.0)
MCV: 78.8 fL — ABNORMAL LOW (ref 80.0–100.0)
Platelets: 373 K/uL (ref 150–400)
RBC: 4.8 MIL/uL (ref 3.87–5.11)
RDW: 15.7 % — ABNORMAL HIGH (ref 11.5–15.5)
WBC: 8.4 K/uL (ref 4.0–10.5)
nRBC: 0 % (ref 0.0–0.2)

## 2018-03-01 LAB — BASIC METABOLIC PANEL WITH GFR
Anion gap: 5 (ref 5–15)
BUN: 7 mg/dL (ref 6–20)
CO2: 24 mmol/L (ref 22–32)
Calcium: 8.9 mg/dL (ref 8.9–10.3)
Chloride: 103 mmol/L (ref 98–111)
Creatinine, Ser: 0.78 mg/dL (ref 0.44–1.00)
GFR calc Af Amer: >60 mL/min
GFR calc non Af Amer: >60 mL/min
Glucose, Bld: 102 mg/dL — ABNORMAL HIGH (ref 70–99)
Potassium: 3.7 mmol/L (ref 3.5–5.1)
Sodium: 132 mmol/L — ABNORMAL LOW (ref 135–145)

## 2018-03-01 LAB — I-STAT BETA HCG BLOOD, ED (MC, WL, AP ONLY): I-stat hCG, quantitative: <5 m[IU]/mL (ref <5)

## 2018-03-01 LAB — I-STAT TROPONIN, ED: Troponin i, poc: 0 ng/mL (ref 0.00–0.08)

## 2018-03-01 MED ORDER — SODIUM CHLORIDE 0.9 % IV BOLUS
1000.0000 mL | Freq: Once | INTRAVENOUS | Status: AC
  Administered 2018-03-01: 1000 mL via INTRAVENOUS

## 2018-03-01 MED ORDER — ACETAMINOPHEN 500 MG PO TABS
1000.0000 mg | ORAL_TABLET | Freq: Once | ORAL | Status: AC
  Administered 2018-03-01: 1000 mg via ORAL
  Filled 2018-03-01: qty 2

## 2018-03-01 MED ORDER — KETOROLAC TROMETHAMINE 30 MG/ML IJ SOLN
15.0000 mg | Freq: Once | INTRAMUSCULAR | Status: AC
  Administered 2018-03-01: 15 mg via INTRAVENOUS
  Filled 2018-03-01: qty 1

## 2018-03-01 MED ORDER — METOCLOPRAMIDE HCL 5 MG/ML IJ SOLN
10.0000 mg | Freq: Once | INTRAMUSCULAR | Status: AC
  Administered 2018-03-01: 10 mg via INTRAVENOUS
  Filled 2018-03-01: qty 2

## 2018-03-01 NOTE — ED Provider Notes (Cosign Needed)
[facility] EMERGENCY DEPARTMENT Provider Note   CSN: [id number] Arrival date & time: 03/01/18  1942     History   Chief Complaint Chief Complaint  Patient presents with  . Chest Pain  . Headache    HPI Sabrina Harding is a 37 y.o. female with a past medical history of anemia, headaches, who presents today for evaluation of generally not feeling well with chest pain and headache. 1.  Chest pain: Patient reports that intermittently for the past 3 days she will have left-sided sharp chest pain.  When it happens it lasts for approximately 2 to 5 minutes and then goes away.  She denies shortness of breath diaphoresis or worsening nausea during this.  She says the pain feels like it is on the left side of her chest and will shoot down to her left leg.  She reports that shoots up the left side of her neck and down her left arm.  She denies any personal or family history of blood clots.  Has not had any surgeries recently.  She does not take any hormones.  No leg swelling. 2.  Headache.  Patient has a history of headaches.  She says that she has had a headache for the past 2 days with mild nausea.  She has associated chills, however has not checked her temperature at home.  She does not take any ibuprofen due to history of kidney injury.    She denies any recent trauma.  No neck pain.  HPI  Past Medical History:  Diagnosis Date  . Anemia   . Gall stones   . Gonorrhea   . Headache(784.0)   . Heart murmur    childhood  . Trichomonas contact     Patient Active Problem List   Diagnosis Date Noted  . Oral herpes 01/10/2016  . Pelvic inflammatory disease (PID) 01/07/2016  . Gonorrhea in female 01/07/2016  . Pyelonephritis 01/06/2016    Past Surgical History:  Procedure Laterality Date  . CHOLECYSTECTOMY  10/17/2011   Procedure: LAPAROSCOPIC CHOLECYSTECTOMY;  Surgeon: Harl Bowie, MD;  Location: [place];  Service: General;  Laterality: N/A;  .  TONSILLECTOMY    . TUBAL LIGATION       OB History    Gravida  6   Para  0   Term  0   Preterm      AB  3   Living  3     SAB  3   TAB      Ectopic      Multiple      Live Births  3            Home Medications    Prior to Admission medications   Medication Sig Start Date End Date Taking? Authorizing Provider  acyclovir (ZOVIRAX) 400 MG tablet Take 1 tablet (400 mg total) by mouth 5 (five) times daily. Patient not taking: Reported on 05/19/2016 01/10/16   Florinda Marker, MD  cephALEXin (KEFLEX) 500 MG capsule Take 1 capsule (500 mg total) by mouth 4 (four) times daily. Patient not taking: Reported on 07/16/2017 05/19/16   Ward, Ozella Almond, PA-C  docusate sodium (COLACE) 250 MG capsule Take 1 capsule (250 mg total) by mouth daily. 07/16/17   Jacqlyn Larsen, PA-C  doxycycline (VIBRAMYCIN) 100 MG capsule Take 1 capsule (100 mg total) by mouth 2 (two) times daily. Patient not taking: Reported on 07/16/2017 05/19/16   Ward, Ozella Almond, PA-C  metroNIDAZOLE (FLAGYL) 500  MG tablet Take 1 tablet (500 mg total) by mouth 2 (two) times daily. One po bid x 7 days 07/16/17   Jacqlyn Larsen, PA-C  ondansetron (ZOFRAN ODT) 4 MG disintegrating tablet Take 1 tablet (4 mg total) by mouth every 8 (eight) hours as needed for nausea or vomiting. Patient not taking: Reported on 07/16/2017 05/19/16   Ward, Ozella Almond, PA-C  ondansetron (ZOFRAN ODT) 4 MG disintegrating tablet 4mg  ODT q4 hours prn nausea/vomit 07/16/17   Jacqlyn Larsen, PA-C  polyethylene glycol [facility]) packet Take 17 g by mouth daily. 07/16/17   Jacqlyn Larsen, PA-C  sulfamethoxazole-trimethoprim (BACTRIM DS,SEPTRA DS) 800-160 MG tablet Take 1 tablet by mouth 2 (two) times daily. Patient not taking: Reported on 05/19/2016 01/10/16   Florinda Marker, MD    Family History Family History  Problem Relation Age of Onset  . Hypertension Father     Social History Social History   Tobacco Use  . Smoking status: Current  Every Day Smoker    Packs/day: 0.50    Years: 8.00    Pack years: 4.00    Types: Cigarettes  . Smokeless tobacco: Never Used  Substance Use Topics  . Alcohol use: Yes    Alcohol/week: 0.0 standard drinks    Comment: occasional  . Drug use: No     Allergies   Patient has no known allergies.   Review of Systems Review of Systems  Constitutional: Positive for chills and fatigue. Negative for fever.  HENT: Negative for congestion, rhinorrhea, sinus pain and sore throat.   Respiratory: Negative for shortness of breath.   Cardiovascular: Positive for chest pain.  Gastrointestinal: Positive for nausea. Negative for abdominal pain, diarrhea and vomiting.  Genitourinary: Negative for dysuria, flank pain, frequency, pelvic pain and urgency.  Musculoskeletal: Negative for back pain and neck pain.  Neurological: Positive for headaches. Negative for weakness and light-headedness.  All other systems reviewed and are negative.    Physical Exam Updated Vital Signs BP 110/75   Pulse 68   Temp 98.3 F (36.8 C) (Oral)   Resp 20   LMP 09/01/2012   SpO2 100%   Physical Exam  Constitutional: She appears well-developed and well-nourished. No distress.  HENT:  Head: Normocephalic and atraumatic.  Eyes: Conjunctivae are normal.  Neck: Normal range of motion. Neck supple. No tracheal deviation present.  Cardiovascular: Normal rate, regular rhythm, intact distal pulses and normal pulses.  No murmur heard. Pulmonary/Chest: Effort normal and breath sounds normal. No respiratory distress. She has no decreased breath sounds. She has no wheezes.  Abdominal: Soft. Bowel sounds are normal. She exhibits no distension. There is no tenderness.  Musculoskeletal: She exhibits no edema.       Right lower leg: Normal. She exhibits no tenderness and no edema.       Left lower leg: Normal. She exhibits no tenderness and no edema.  Neurological: She is alert.  Patient is awake and alert, she is able to  carry on a normal conversation without evidence of speech difficulty.  She has 5/5 strength in bilateral upper and lower extremities.  No slurred speech or facial droop.  Skin: Skin is warm and dry.  Psychiatric: She has a normal mood and affect.  Nursing note and vitals reviewed.    ED Treatments / Results  Labs (all labs ordered are listed, but only abnormal results are displayed) Labs Reviewed  BASIC METABOLIC PANEL - Abnormal; Notable for the following components:      Result  Value   Sodium 132 (*)    Glucose, Bld 102 (*)    All other components within normal limits  CBC - Abnormal; Notable for the following components:   Hemoglobin 11.3 (*)    MCV 78.8 (*)    MCH 23.5 (*)    MCHC 29.9 (*)    RDW 15.7 (*)    All other components within normal limits  I-STAT TROPONIN, ED  I-STAT BETA HCG BLOOD, ED ([facility], [facility], [facility] ONLY)    EKG EKG Interpretation  Date/Time:  Sunday March 01 2018 19:58:45 EST Ventricular Rate:  82 PR Interval:    QRS Duration: 98 QT Interval:  374 QTC Calculation: 437 R Axis:   46 Text Interpretation:  Sinus rhythm Probable left atrial enlargement Abnormal inferior Q waves since last tracing no significant change Confirmed by Daleen Bo [phone]) on 03/01/2018 10:21:39 PM   Radiology Dg Chest 2 View  Result Date: 03/01/2018 CLINICAL DATA:  Left-sided chest pain for several days. EXAM: CHEST - 2 VIEW COMPARISON:  08/18/2012 FINDINGS: The heart size and mediastinal contours are within normal limits. Both lungs are clear. The visualized skeletal structures are unremarkable. IMPRESSION: No active cardiopulmonary disease. Electronically Signed   By: Earle Gell M.D.   On: 03/01/2018 21:16    Procedures Procedures (including critical care time)  Medications Ordered in ED Medications  sodium chloride 0.9 % bolus 1,000 mL (1,000 mLs Intravenous New Bag/Given 03/01/18 2115)  acetaminophen (TYLENOL) tablet 1,000 mg (1,000 mg Oral Given 03/01/18 2115)    metoCLOPramide (REGLAN) injection 10 mg (10 mg Intravenous Given 03/01/18 2115)  ketorolac (TORADOL) 30 MG/ML injection 15 mg (15 mg Intravenous Given 03/01/18 2222)     Initial Impression / Assessment and Plan / ED Course  I have reviewed the triage vital signs and the nursing notes.  Pertinent labs & imaging results that were available during my care of the patient were reviewed by me and considered in my medical decision making (see chart for details).    Patient presents today for evaluation of chest pain, headache, and generally not feeling well.  Patient is to be discharged with recommendation to follow up with PCP in regards to today's hospital visit. Chest pain is not likely of cardiac or pulmonary etiology d/t presentation, PERC negative, VSS, no tracheal deviation, no JVD or new murmur, RRR, breath sounds equal bilaterally, EKG without acute abnormalities, negative troponin, and negative CXR. Pt has been advised to return to the ED if CP becomes exertional, associated with diaphoresis or nausea, radiates to left jaw/arm, worsens or becomes concerning in any way.   Regarding her headache she is treated with fluids, Tylenol, and Reglan.  After that she felt slightly better however had not had full relief.  Will add on Toradol.    At shift change care was transferred to Dr. Eulis Foster who will follow pending studies, re-evaulate and determine disposition.     Final Clinical Impressions(s) / ED Diagnoses   Final diagnoses:  Atypical chest pain  Acute nonintractable headache, unspecified headache type    ED Discharge Orders    None       Lorin Glass, [place] 03/01/18 2224

## 2018-03-01 NOTE — Discharge Instructions (Addendum)
The testing today did not show any serious problems.  Make sure you are getting plenty of rest and drinking a lot of fluids.  For pain use Tylenol.  Follow-up with your doctor if you are not better in 3 or 4 days.

## 2018-03-01 NOTE — ED Triage Notes (Signed)
Pt c/o centralized and left sided chest pain for the past few days. Pain shoots down L arm from chest. Also reports chills and headaches. Pt reports she's not supposed to take either ibuprofen or aspirin but she can't remember which one due to a previous kidney injury

## 2018-03-02 NOTE — ED Provider Notes (Signed)
  Face-to-face evaluation   History: She is here for evaluation of headaches, with chest pain.  Vision states that she is under some stress, which may be adding to her headache.  Physical exam: Alert, calm, cooperative.  No dysarthria, or aphasia.  She is lucid.    Clinical Course as of Mar 02 4  Mon Mar 02, 2018  0001 Normal  I-Stat beta hCG blood, ED [EW]  0001 Normal except hemoglobin low, MCV low  CBC(!) [EW]  0001 Normal  I-stat troponin, ED [EW]  0001 Normal except sodium low, glucose high  Basic metabolic panel(!) [EW]    Clinical Course User Index [EW] Daleen Bo, MD     Patient Vitals for the past 24 hrs:  BP Temp Temp src Pulse Resp SpO2  03/01/18 2300 104/78 - - 71 16 100 %  03/01/18 2230 100/62 - - 85 18 98 %  03/01/18 2200 110/75 - - 68 20 100 %  03/01/18 2158 111/79 - - 77 16 100 %  03/01/18 1959 113/65 98.3 F (36.8 C) Oral 78 16 100 %            Medical screening examination/treatment/procedure(s) were conducted as a shared visit with non-physician practitioner(s) and myself.  I personally evaluated the patient during the encounter    Daleen Bo, MD 03/02/18 [phone]

## 2018-03-03 ENCOUNTER — Encounter (HOSPITAL_COMMUNITY): Payer: Self-pay

## 2018-03-03 ENCOUNTER — Emergency Department (HOSPITAL_COMMUNITY)
Admission: EM | Admit: 2018-03-03 | Discharge: 2018-03-03 | Disposition: A | Discharge status: Home / Self Care | Payer: Self-pay | Attending: Emergency Medicine | Admitting: Emergency Medicine

## 2018-03-03 ENCOUNTER — Other Ambulatory Visit: Payer: Self-pay

## 2018-03-03 DIAGNOSIS — R51 Headache: Secondary | ICD-10-CM | POA: Insufficient documentation

## 2018-03-03 DIAGNOSIS — J069 Acute upper respiratory infection, unspecified: Secondary | ICD-10-CM | POA: Insufficient documentation

## 2018-03-03 DIAGNOSIS — F17210 Nicotine dependence, cigarettes, uncomplicated: Secondary | ICD-10-CM | POA: Insufficient documentation

## 2018-03-03 DIAGNOSIS — Z79899 Other long term (current) drug therapy: Secondary | ICD-10-CM | POA: Insufficient documentation

## 2018-03-03 LAB — CBC
HCT: 41 % (ref 36.0–46.0)
HEMOGLOBIN: 12.6 g/dL (ref 12.0–15.0)
MCH: 24.5 pg — AB (ref 26.0–34.0)
MCHC: 30.7 g/dL (ref 30.0–36.0)
MCV: 79.8 fL — ABNORMAL LOW (ref 80.0–100.0)
Platelets: 378 10*3/uL (ref 150–400)
RBC: 5.14 MIL/uL — AB (ref 3.87–5.11)
RDW: 15.8 % — ABNORMAL HIGH (ref 11.5–15.5)
WBC: 9.2 10*3/uL (ref 4.0–10.5)
nRBC: 0 % (ref 0.0–0.2)

## 2018-03-03 LAB — BASIC METABOLIC PANEL
ANION GAP: 7 (ref 5–15)
BUN: 9 mg/dL (ref 6–20)
CO2: 27 mmol/L (ref 22–32)
Calcium: 9.2 mg/dL (ref 8.9–10.3)
Chloride: 107 mmol/L (ref 98–111)
Creatinine, Ser: 0.82 mg/dL (ref 0.44–1.00)
GFR calc Af Amer: >60 mL/min (ref >60)
GFR calc non Af Amer: >60 mL/min (ref >60)
GLUCOSE: 107 mg/dL — AB (ref 70–99)
Potassium: 4.1 mmol/L (ref 3.5–5.1)
Sodium: 141 mmol/L (ref 135–145)

## 2018-03-03 LAB — I-STAT TROPONIN, ED: Troponin i, poc: 0 ng/mL (ref 0.00–0.08)

## 2018-03-03 LAB — I-STAT BETA HCG BLOOD, ED (MC, WL, AP ONLY): I-stat hCG, quantitative: <5 m[IU]/mL (ref <5)

## 2018-03-03 MED ORDER — GUAIFENESIN ER 600 MG PO TB12: 600.0000 mg | ORAL_TABLET | Freq: Two times a day (BID) | ORAL | 0 refills | Status: DC

## 2018-03-03 MED ORDER — BENZONATATE 100 MG PO CAPS: 100.0000 mg | ORAL_CAPSULE | Freq: Three times a day (TID) | ORAL | 0 refills | Status: DC

## 2018-03-03 MED ORDER — IBUPROFEN 600 MG PO TABS: 600.0000 mg | ORAL_TABLET | Freq: Three times a day (TID) | ORAL | 0 refills | Status: DC | PRN

## 2018-03-03 NOTE — ED Provider Notes (Signed)
[place] DEPT Provider Note   CSN: [id number] Arrival date & time: 03/03/18  1744     History   Chief Complaint Chief Complaint  Patient presents with  . Chest Pain    HPI Sabrina Harding is a 37 y.o. female.  HPI Pt has been having trouble with chest pain, cough and headache the last several days.  The pain is sharp in the chest.  It seems to be getting worse.  The pain lasts a few minutes at a time.  She tries massage to help it.  She has felt chilled but no fever measured. No leg swelling.  No history of heart or lung problems.  No estrogen meds.  No dvt or pe. Past Medical History:  Diagnosis Date  . Anemia   . Gall stones   . Gonorrhea   . Headache(784.0)   . Heart murmur    childhood  . Trichomonas contact     Patient Active Problem List   Diagnosis Date Noted  . Oral herpes 01/10/2016  . Pelvic inflammatory disease (PID) 01/07/2016  . Gonorrhea in female 01/07/2016  . Pyelonephritis 01/06/2016    Past Surgical History:  Procedure Laterality Date  . CHOLECYSTECTOMY  10/17/2011   Procedure: LAPAROSCOPIC CHOLECYSTECTOMY;  Surgeon: Harl Bowie, MD;  Location: [place];  Service: General;  Laterality: N/A;  . TONSILLECTOMY    . TUBAL LIGATION       OB History    Gravida  6   Para  0   Term  0   Preterm      AB  3   Living  3     SAB  3   TAB      Ectopic      Multiple      Live Births  3            Home Medications    Prior to Admission medications   Medication Sig Start Date End Date Taking? Authorizing Provider  acyclovir (ZOVIRAX) 400 MG tablet Take 1 tablet (400 mg total) by mouth 5 (five) times daily. Patient not taking: Reported on 05/19/2016 01/10/16   Florinda Marker, MD  benzonatate (TESSALON) 100 MG capsule Take 1 capsule (100 mg total) by mouth every 8 (eight) hours. 03/03/18   Dorie Rank, MD  cephALEXin (KEFLEX) 500 MG capsule Take 1 capsule (500 mg total) by mouth 4 (four) times  daily. Patient not taking: Reported on 07/16/2017 05/19/16   Ward, Ozella Almond, PA-C  docusate sodium (COLACE) 250 MG capsule Take 1 capsule (250 mg total) by mouth daily. Patient not taking: Reported on 03/03/2018 07/16/17   Jacqlyn Larsen, PA-C  doxycycline (VIBRAMYCIN) 100 MG capsule Take 1 capsule (100 mg total) by mouth 2 (two) times daily. Patient not taking: Reported on 07/16/2017 05/19/16   Ward, Ozella Almond, PA-C  guaiFENesin (MUCINEX) 600 MG 12 hr tablet Take 1 tablet (600 mg total) by mouth 2 (two) times daily. 03/03/18   Dorie Rank, MD  ibuprofen (ADVIL,MOTRIN) 600 MG tablet Take 1 tablet (600 mg total) by mouth every 8 (eight) hours as needed. 03/03/18   Dorie Rank, MD  metroNIDAZOLE (FLAGYL) 500 MG tablet Take 1 tablet (500 mg total) by mouth 2 (two) times daily. One po bid x 7 days Patient not taking: Reported on 03/03/2018 07/16/17   Jacqlyn Larsen, PA-C  ondansetron (ZOFRAN ODT) 4 MG disintegrating tablet Take 1 tablet (4 mg total) by mouth every 8 (eight) hours  as needed for nausea or vomiting. Patient not taking: Reported on 07/16/2017 05/19/16   Ward, Ozella Almond, PA-C  ondansetron (ZOFRAN ODT) 4 MG disintegrating tablet 4mg  ODT q4 hours prn nausea/vomit Patient not taking: Reported on 03/03/2018 07/16/17   Jacqlyn Larsen, PA-C  polyethylene glycol [facility]) packet Take 17 g by mouth daily. Patient not taking: Reported on 03/03/2018 07/16/17   Jacqlyn Larsen, PA-C  sulfamethoxazole-trimethoprim (BACTRIM DS,SEPTRA DS) 800-160 MG tablet Take 1 tablet by mouth 2 (two) times daily. Patient not taking: Reported on 05/19/2016 01/10/16   Florinda Marker, MD    Family History Family History  Problem Relation Age of Onset  . Hypertension Father     Social History Social History   Tobacco Use  . Smoking status: Current Every Day Smoker    Packs/day: 0.50    Years: 8.00    Pack years: 4.00    Types: Cigarettes  . Smokeless tobacco: Never Used  Substance Use Topics  . Alcohol  use: Yes    Alcohol/week: 0.0 standard drinks    Comment: occasional  . Drug use: No     Allergies   Patient has no known allergies.   Review of Systems Review of Systems  All other systems reviewed and are negative.    Physical Exam Updated Vital Signs BP 136/90 (BP Location: Right Arm)   Pulse 75   Temp 98.2 F (36.8 C) (Oral)   Resp 19   LMP 09/01/2012   SpO2 100%   Physical Exam  Constitutional: She appears well-developed and well-nourished. No distress.  HENT:  Head: Normocephalic and atraumatic.  Right Ear: External ear normal.  Left Ear: External ear normal.  Eyes: Conjunctivae are normal. Right eye exhibits no discharge. Left eye exhibits no discharge. No scleral icterus.  Neck: Neck supple. No tracheal deviation present.  Cardiovascular: Normal rate, regular rhythm and intact distal pulses.  Pulmonary/Chest: Effort normal and breath sounds normal. No stridor. No respiratory distress. She has no wheezes. She has no rales.  Chest wall ttp  Abdominal: Soft. Bowel sounds are normal. She exhibits no distension. There is no tenderness. There is no rebound and no guarding.  Musculoskeletal: She exhibits no edema or tenderness.  Neurological: She is alert. She has normal strength. No cranial nerve deficit (no facial droop, extraocular movements intact, no slurred speech) or sensory deficit. She exhibits normal muscle tone. She displays no seizure activity. Coordination normal.  Skin: Skin is warm and dry. No rash noted.  Psychiatric: She has a normal mood and affect.  Nursing note and vitals reviewed.    ED Treatments / Results  Labs (all labs ordered are listed, but only abnormal results are displayed) Labs Reviewed  BASIC METABOLIC PANEL - Abnormal; Notable for the following components:      Result Value   Glucose, Bld 107 (*)    All other components within normal limits  CBC - Abnormal; Notable for the following components:   RBC 5.14 (*)    MCV 79.8 (*)      MCH 24.5 (*)    RDW 15.8 (*)    All other components within normal limits  I-STAT TROPONIN, ED  I-STAT BETA HCG BLOOD, ED ([facility], [facility], [facility] ONLY)    EKG EKG Interpretation  Date/Time:  Tuesday March 03 2018 18:08:42 EST Ventricular Rate:  90 PR Interval:    QRS Duration: 88 QT Interval:  348 QTC Calculation: 426 R Axis:   46 Text Interpretation:  Sinus rhythm Probable left  atrial enlargement Baseline wander in lead(s) V5 No significant change since last tracing Confirmed by Dorie Rank [phone]) on 03/03/2018 6:24:49 PM   Radiology No results found.  Procedures Procedures (including critical care time)  Medications Ordered in ED Medications - No data to display   Initial Impression / Assessment and Plan / ED Course  I have reviewed the triage vital signs and the nursing notes.  Pertinent labs & imaging results that were available during my care of the patient were reviewed by me and considered in my medical decision making (see chart for details).  Clinical Course as of Mar 05 7  Tue Mar 03, 2018  1800 She just had a chest x-ray 2 days ago.  I do not feel that she needs to have that repeated   [JK]    Clinical Course User Index [JK] Dorie Rank, MD   Patient's laboratory tests are reassuring.  Previous chest x-ray was normal.  No signs of any acute infection.  Symptoms may be related to pleurisy due to her URI and cough.  Plan on discharge home with symptomatic medications  Final Clinical Impressions(s) / ED Diagnoses   Final diagnoses:  Upper respiratory tract infection, unspecified type    ED Discharge Orders         Ordered    benzonatate (TESSALON) 100 MG capsule  Every 8 hours     03/03/18 2011    guaiFENesin (MUCINEX) 600 MG 12 hr tablet  2 times daily     03/03/18 2011    ibuprofen (ADVIL,MOTRIN) 600 MG tablet  Every 8 hours PRN     03/03/18 2011           Dorie Rank, MD 03/04/18 0008

## 2018-03-03 NOTE — ED Triage Notes (Signed)
Pt reports chest pain for the last 3-4 days. She states that it starts in her L chest and shoots down her L leg. A&Ox4. Ambulatory. Reports "slight" SOB with her pain. She denies anything worsening the pain or making the pain better.

## 2018-03-03 NOTE — Discharge Instructions (Addendum)
Also take sudafed behind the counter to help with congestion.  All up with your doctor next week if not getting any better

## 2018-04-01 IMAGING — CR DG ANKLE COMPLETE 3+V*R*
3 series · 3 of 3 positions shown · non-contrast
Comparison: 07/09/2015

CLINICAL DATA: Having pain and increased swelling around entire
right ankle for the last 3 days. No injury.

EXAM:
RIGHT ANKLE - COMPLETE 3+ VIEW

[x ankle ap right]
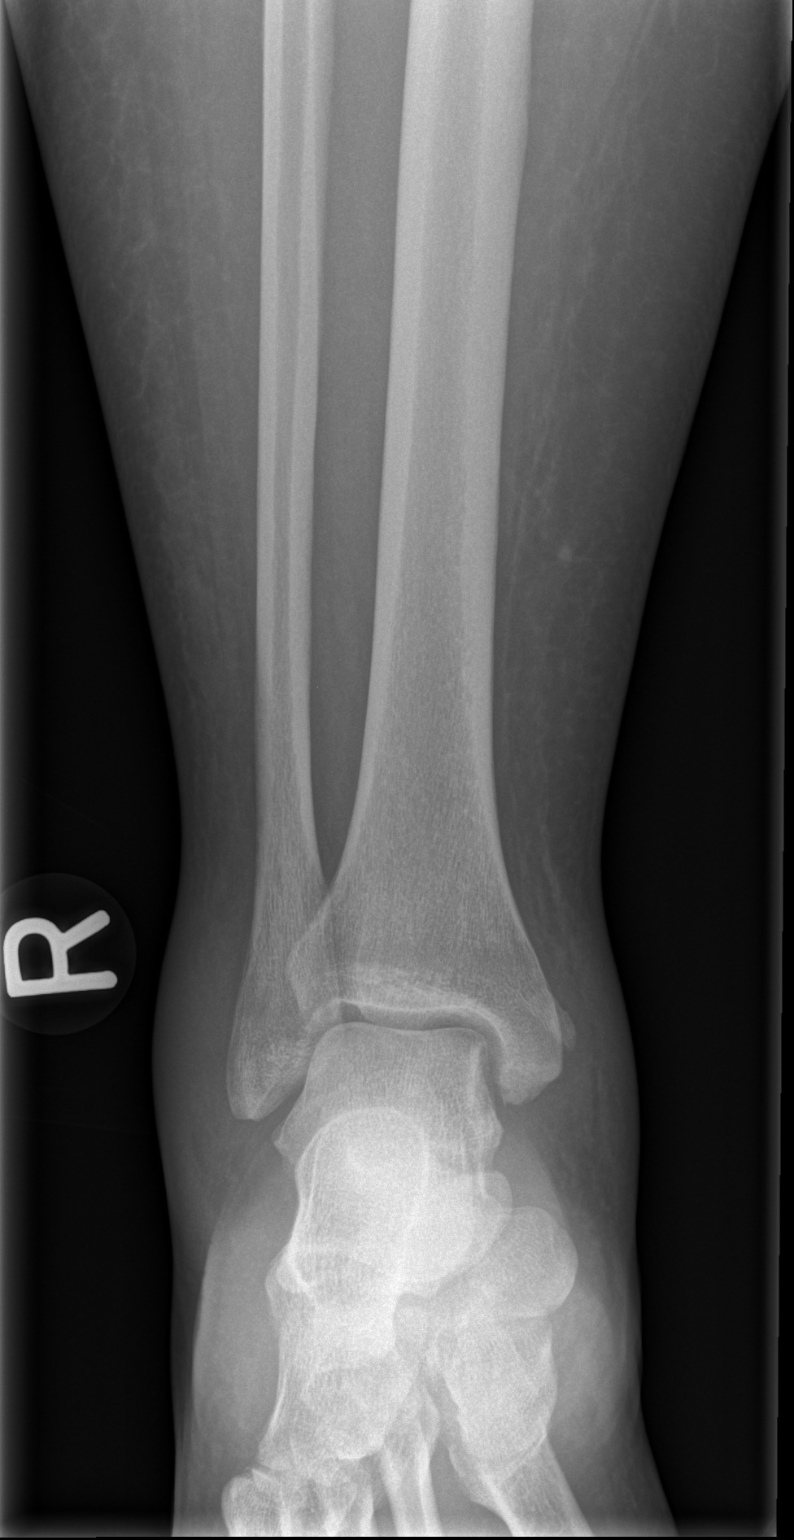

[x ankle obl right]
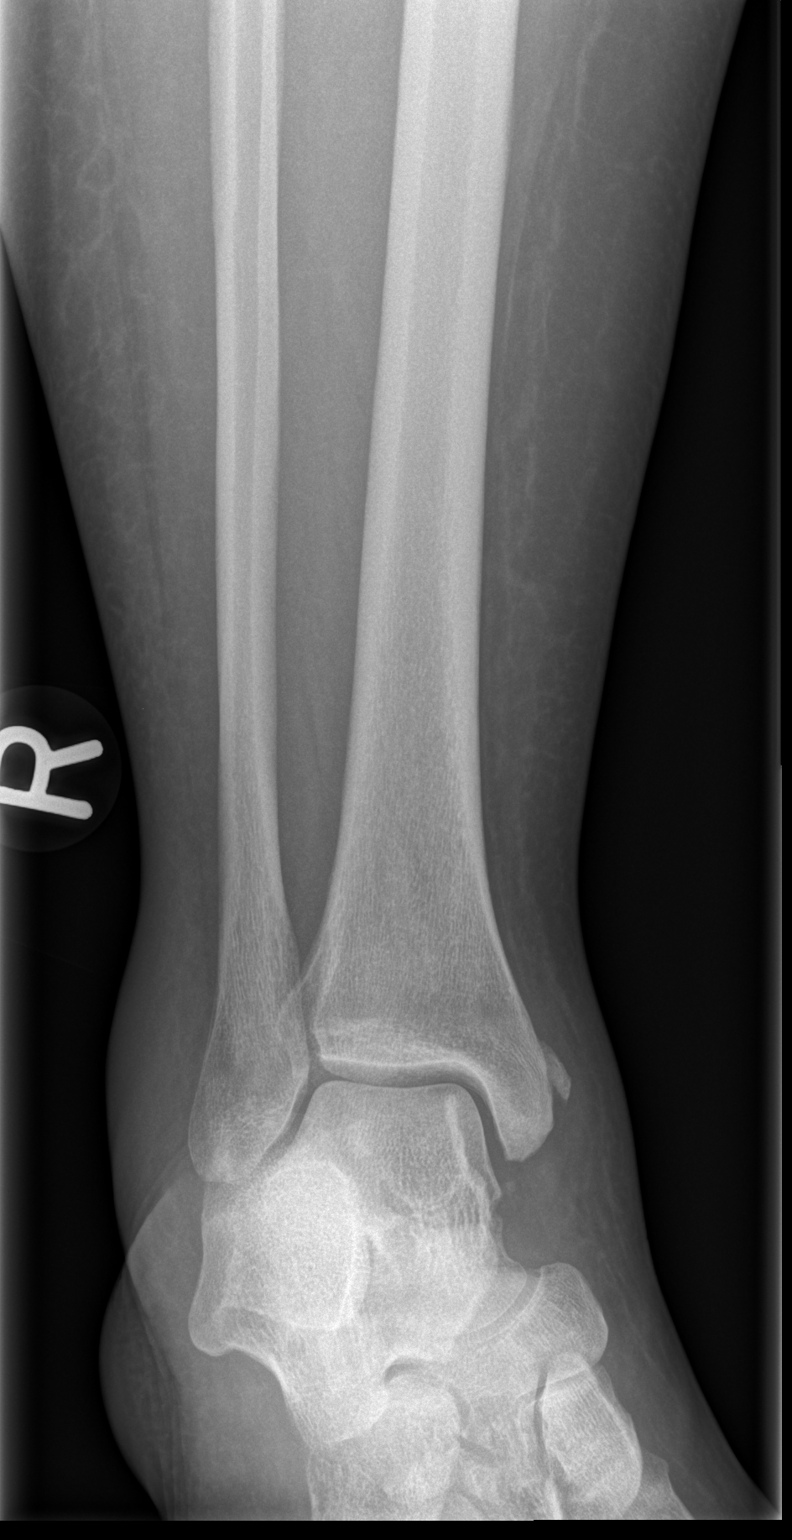

[x ankle lat right]
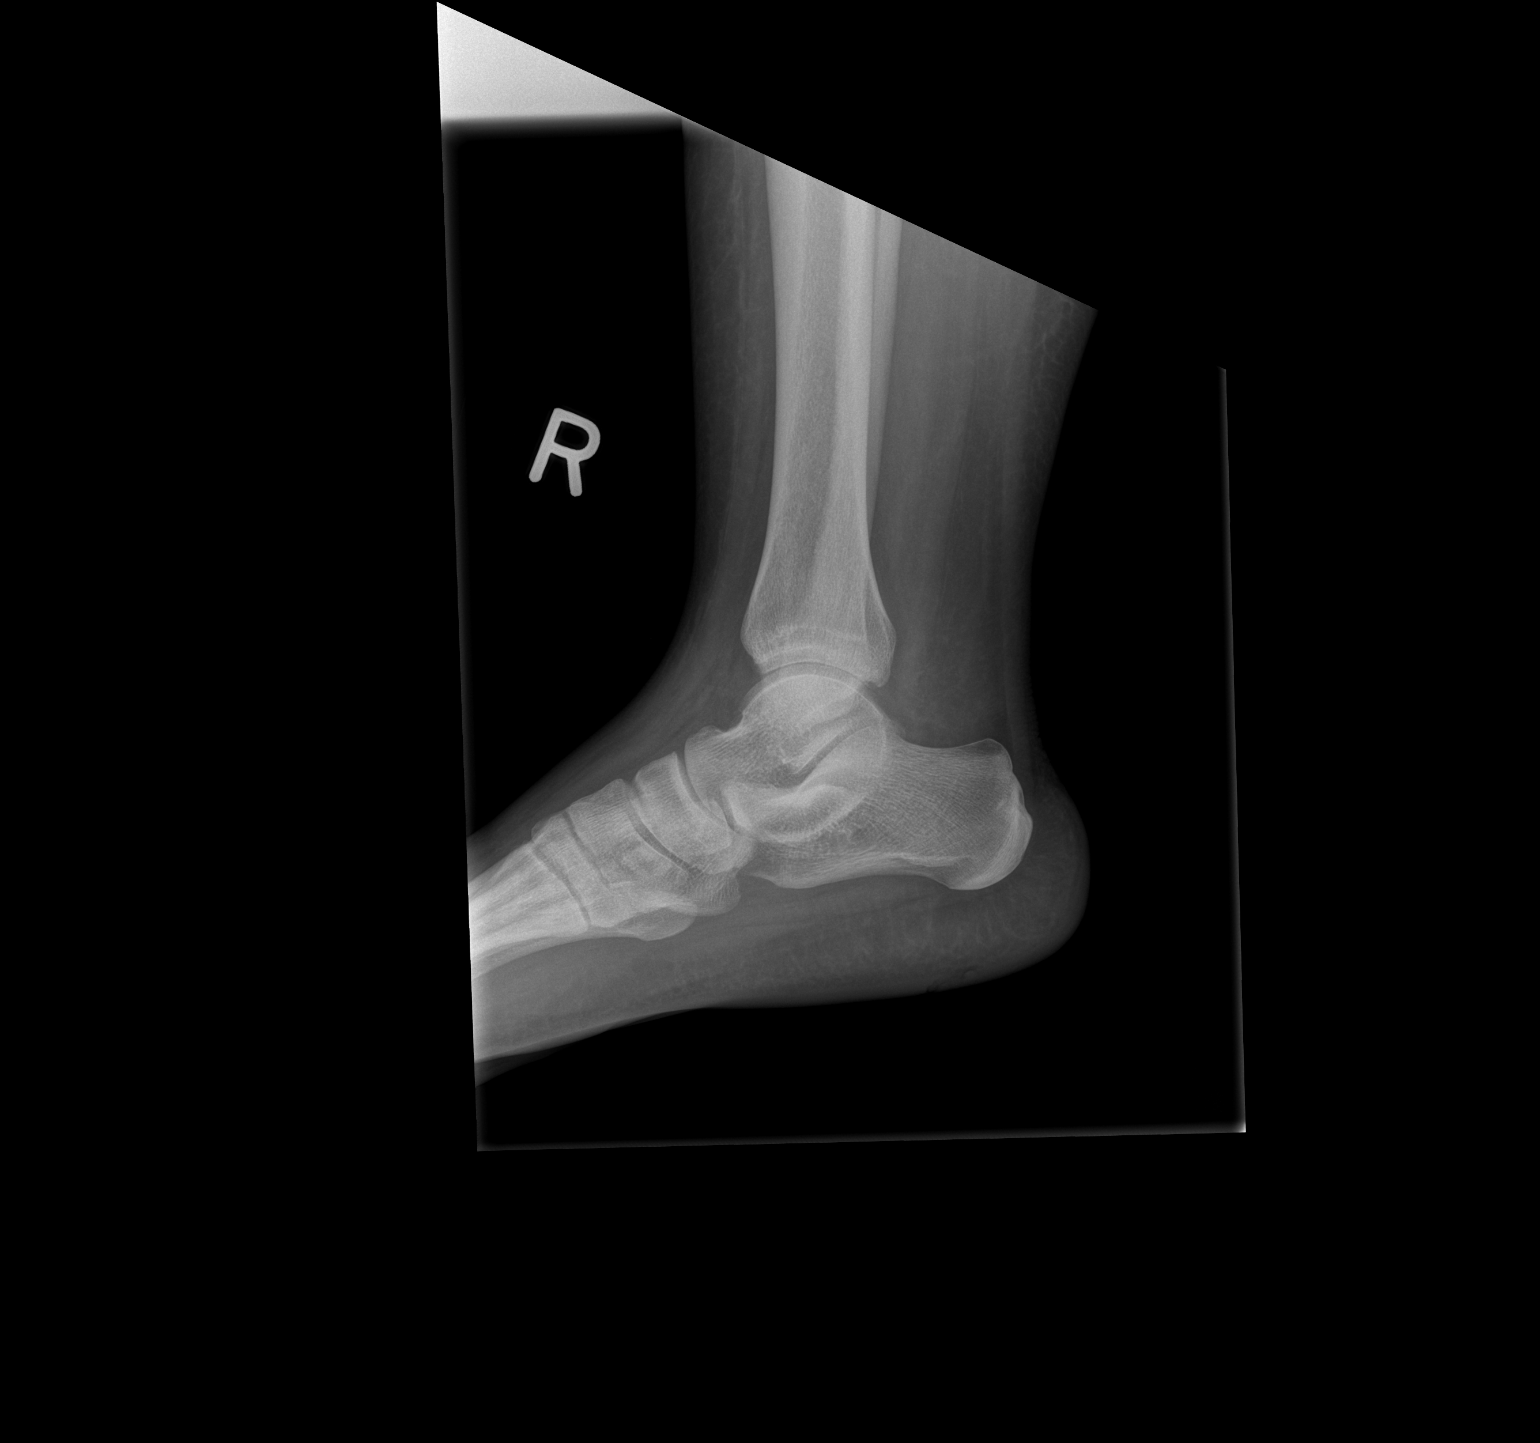

[3 of 3 positions shown; findings below may reference images not displayed]

FINDINGS: Tiny avulsion fragments distal to the medial malleolus seen on the
previous study are again noted. Spur or heterotopic ossification
along the medial malleolus is new in the interval likely related to
associated prior avulsion injury. Ankle mortise is preserved today.
No evidence for an acute fracture. No subluxation. Soft tissue
swelling is evident.
IMPRESSION: No acute bony findings with soft tissue swelling noted.

## 2018-05-26 ENCOUNTER — Encounter (HOSPITAL_COMMUNITY): Payer: Self-pay | Admitting: Emergency Medicine

## 2018-05-26 ENCOUNTER — Emergency Department (HOSPITAL_COMMUNITY): Payer: Self-pay

## 2018-05-26 ENCOUNTER — Emergency Department (HOSPITAL_COMMUNITY)
Admission: EM | Admit: 2018-05-26 | Discharge: 2018-05-26 | Disposition: A | Discharge status: Home / Self Care | Payer: Self-pay | Attending: Emergency Medicine | Admitting: Emergency Medicine

## 2018-05-26 DIAGNOSIS — R05 Cough: Secondary | ICD-10-CM | POA: Insufficient documentation

## 2018-05-26 DIAGNOSIS — R0789 Other chest pain: Secondary | ICD-10-CM | POA: Insufficient documentation

## 2018-05-26 DIAGNOSIS — E669 Obesity, unspecified: Secondary | ICD-10-CM | POA: Insufficient documentation

## 2018-05-26 DIAGNOSIS — R059 Cough, unspecified: Secondary | ICD-10-CM

## 2018-05-26 DIAGNOSIS — I10 Essential (primary) hypertension: Secondary | ICD-10-CM | POA: Insufficient documentation

## 2018-05-26 DIAGNOSIS — R1012 Left upper quadrant pain: Secondary | ICD-10-CM | POA: Insufficient documentation

## 2018-05-26 DIAGNOSIS — F17210 Nicotine dependence, cigarettes, uncomplicated: Secondary | ICD-10-CM | POA: Insufficient documentation

## 2018-05-26 DIAGNOSIS — J0100 Acute maxillary sinusitis, unspecified: Secondary | ICD-10-CM | POA: Insufficient documentation

## 2018-05-26 DIAGNOSIS — R112 Nausea with vomiting, unspecified: Secondary | ICD-10-CM | POA: Insufficient documentation

## 2018-05-26 LAB — URINALYSIS, ROUTINE W REFLEX MICROSCOPIC
Bilirubin Urine: NEGATIVE
GLUCOSE, UA: NEGATIVE mg/dL
KETONES UR: NEGATIVE mg/dL
Nitrite: POSITIVE — AB
PROTEIN: NEGATIVE mg/dL
Specific Gravity, Urine: 1.017 (ref 1.005–1.030)
pH: 5 (ref 5.0–8.0)

## 2018-05-26 LAB — COMPREHENSIVE METABOLIC PANEL
ALBUMIN: 3.9 g/dL (ref 3.5–5.0)
ALT: 25 U/L (ref 0–44)
ANION GAP: 10 (ref 5–15)
AST: 22 U/L (ref 15–41)
Alkaline Phosphatase: 92 U/L (ref 38–126)
BILIRUBIN TOTAL: 0.7 mg/dL (ref 0.3–1.2)
BUN: 13 mg/dL (ref 6–20)
CO2: 26 mmol/L (ref 22–32)
Calcium: 9.1 mg/dL (ref 8.9–10.3)
Chloride: 104 mmol/L (ref 98–111)
Creatinine, Ser: 0.9 mg/dL (ref 0.44–1.00)
GFR calc Af Amer: >60 mL/min (ref >60)
GFR calc non Af Amer: >60 mL/min (ref >60)
GLUCOSE: 112 mg/dL — AB (ref 70–99)
Potassium: 4.1 mmol/L (ref 3.5–5.1)
SODIUM: 140 mmol/L (ref 135–145)
TOTAL PROTEIN: 8.1 g/dL (ref 6.5–8.1)

## 2018-05-26 LAB — CBC
HEMATOCRIT: 41.6 % (ref 36.0–46.0)
HEMOGLOBIN: 12.8 g/dL (ref 12.0–15.0)
MCH: 24.8 pg — AB (ref 26.0–34.0)
MCHC: 30.8 g/dL (ref 30.0–36.0)
MCV: 80.5 fL (ref 80.0–100.0)
Platelets: 424 10*3/uL — ABNORMAL HIGH (ref 150–400)
RBC: 5.17 MIL/uL — AB (ref 3.87–5.11)
RDW: 15.9 % — ABNORMAL HIGH (ref 11.5–15.5)
WBC: 11.2 10*3/uL — ABNORMAL HIGH (ref 4.0–10.5)
nRBC: 0 % (ref 0.0–0.2)

## 2018-05-26 LAB — LIPASE, BLOOD: LIPASE: 26 U/L (ref 11–51)

## 2018-05-26 LAB — I-STAT BETA HCG BLOOD, ED (MC, WL, AP ONLY): I-stat hCG, quantitative: <5 m[IU]/mL (ref <5)

## 2018-05-26 MED ORDER — AMOXICILLIN-POT CLAVULANATE 875-125 MG PO TABS: 1.0000 | ORAL_TABLET | Freq: Two times a day (BID) | ORAL | 0 refills | Status: DC

## 2018-05-26 MED ORDER — IPRATROPIUM-ALBUTEROL 0.5-2.5 (3) MG/3ML IN SOLN
3.0000 mL | Freq: Once | RESPIRATORY_TRACT | Status: AC
  Administered 2018-05-26: 3 mL via RESPIRATORY_TRACT
  Filled 2018-05-26: qty 3

## 2018-05-26 MED ORDER — KETOROLAC TROMETHAMINE 30 MG/ML IJ SOLN
30.0000 mg | Freq: Once | INTRAMUSCULAR | Status: AC
  Administered 2018-05-26: 30 mg via INTRAMUSCULAR
  Filled 2018-05-26: qty 1

## 2018-05-26 MED ORDER — ONDANSETRON 4 MG PO TBDP: 4.0000 mg | ORAL_TABLET | Freq: Three times a day (TID) | ORAL | 0 refills | Status: DC | PRN

## 2018-05-26 MED ORDER — ONDANSETRON 4 MG PO TBDP
4.0000 mg | ORAL_TABLET | Freq: Once | ORAL | Status: AC
  Administered 2018-05-26: 4 mg via ORAL
  Filled 2018-05-26: qty 1

## 2018-05-26 MED ORDER — AMOXICILLIN 500 MG PO CAPS: 500.0000 mg | ORAL_CAPSULE | Freq: Three times a day (TID) | ORAL | 0 refills | Status: DC

## 2018-05-26 MED ORDER — BENZONATATE 100 MG PO CAPS: 100.0000 mg | ORAL_CAPSULE | Freq: Three times a day (TID) | ORAL | 0 refills | Status: DC | PRN

## 2018-05-26 NOTE — ED Notes (Signed)
Pt verbalized discharge instructions and follow up care. Alert and ambulatory  

## 2018-05-26 NOTE — ED Triage Notes (Signed)
Pt states for 2 weeks had cough, ear pain, sore throat, hot and cold chills. Pt adds vomiting.

## 2018-05-26 NOTE — ED Provider Notes (Signed)
[facility] DEPT Provider Note   CSN: [id number] Arrival date & time: 05/26/18  1209    History   Chief Complaint Chief Complaint  Patient presents with  . Cough  . Emesis  . Sore Throat  . Otalgia    HPI Sabrina Harding is a 38 y.o. female with history of anemia, headaches presenting for evaluation of acute onset, progressively worsening nasal congestion, sore throat, bilateral otalgia, productive cough for 2 weeks.  She reports subjective fevers and chills and had a maximum temperature of 101 F at home.  She notes generalized myalgias.  Has had some constant chest pains for the last week which worsen with cough and certain movements.  Also notes some shortness of breath which worsens when laying flat.  Denies leg swelling, no recent travel or surgeries, gross hemoptysis, prior history of DVT or PE, or hormone replacement therapy/OCPs.  She does note that she has had some nonbloody nonbilious emesis beginning this week and has had difficulty keeping food down due to nausea.  She has upper abdominal pain which she describes as a burning sensation.  Has tried TheraFlu, Tylenol, ibuprofen, and numerous OTC medications with minimal relief.  Denies urinary symptoms, diarrhea, constipation, melena, hematochezia.  She smokes occasionally, drinks socially, denies recreational drug use.     The history is provided by the patient.    Past Medical History:  Diagnosis Date  . Anemia   . Gall stones   . Gonorrhea   . Headache(784.0)   . Heart murmur    childhood  . Trichomonas contact     Patient Active Problem List   Diagnosis Date Noted  . Oral herpes 01/10/2016  . Pelvic inflammatory disease (PID) 01/07/2016  . Gonorrhea in female 01/07/2016  . Pyelonephritis 01/06/2016    Past Surgical History:  Procedure Laterality Date  . CHOLECYSTECTOMY  10/17/2011   Procedure: LAPAROSCOPIC CHOLECYSTECTOMY;  Surgeon: Harl Bowie, MD;  Location: [facility];  Service: General;  Laterality: N/A;  . TONSILLECTOMY    . TUBAL LIGATION       OB History    Gravida  6   Para  0   Term  0   Preterm      AB  3   Living  3     SAB  3   TAB      Ectopic      Multiple      Live Births  3            Home Medications    Prior to Admission medications   Medication Sig Start Date End Date Taking? Authorizing Provider  acyclovir (ZOVIRAX) 400 MG tablet Take 1 tablet (400 mg total) by mouth 5 (five) times daily. Patient not taking: Reported on 05/19/2016 01/10/16   Florinda Marker, MD  amoxicillin (AMOXIL) 500 MG capsule Take 1 capsule (500 mg total) by mouth 3 (three) times daily for 7 days. 05/26/18 06/02/18  Rodell Perna A, PA-C  benzonatate (TESSALON) 100 MG capsule Take 1 capsule (100 mg total) by mouth 3 (three) times daily as needed for cough. 05/26/18   Rhonin Trott A, PA-C  cephALEXin (KEFLEX) 500 MG capsule Take 1 capsule (500 mg total) by mouth 4 (four) times daily. Patient not taking: Reported on 07/16/2017 05/19/16   Ward, Ozella Almond, PA-C  docusate sodium (COLACE) 250 MG capsule Take 1 capsule (250 mg total) by mouth daily. Patient not taking: Reported on 03/03/2018 07/16/17   Marijean Bravo,  Audery Amel, PA-C  doxycycline (VIBRAMYCIN) 100 MG capsule Take 1 capsule (100 mg total) by mouth 2 (two) times daily. Patient not taking: Reported on 07/16/2017 05/19/16   Ward, Ozella Almond, PA-C  guaiFENesin (MUCINEX) 600 MG 12 hr tablet Take 1 tablet (600 mg total) by mouth 2 (two) times daily. Patient not taking: Reported on 05/26/2018 03/03/18   Dorie Rank, MD  ibuprofen (ADVIL,MOTRIN) 600 MG tablet Take 1 tablet (600 mg total) by mouth every 8 (eight) hours as needed. Patient not taking: Reported on 05/26/2018 03/03/18   Dorie Rank, MD  metroNIDAZOLE (FLAGYL) 500 MG tablet Take 1 tablet (500 mg total) by mouth 2 (two) times daily. One po bid x 7 days Patient not taking: Reported on 03/03/2018 07/16/17   Jacqlyn Larsen, PA-C  ondansetron (ZOFRAN  ODT) 4 MG disintegrating tablet Take 1 tablet (4 mg total) by mouth every 8 (eight) hours as needed for nausea or vomiting. 05/26/18   Nils Flack, Fahim Kats A, PA-C  polyethylene glycol (MIRALAX) packet Take 17 g by mouth daily. Patient not taking: Reported on 03/03/2018 07/16/17   Jacqlyn Larsen, PA-C  sulfamethoxazole-trimethoprim (BACTRIM DS,SEPTRA DS) 800-160 MG tablet Take 1 tablet by mouth 2 (two) times daily. Patient not taking: Reported on 05/19/2016 01/10/16   Florinda Marker, MD    Family History Family History  Problem Relation Age of Onset  . Hypertension Father     Social History Social History   Tobacco Use  . Smoking status: Current Every Day Smoker    Packs/day: 0.50    Years: 8.00    Pack years: 4.00    Types: Cigarettes  . Smokeless tobacco: Never Used  Substance Use Topics  . Alcohol use: Yes    Alcohol/week: 0.0 standard drinks    Comment: occasional  . Drug use: No     Allergies   Patient has no known allergies.   Review of Systems Review of Systems  Constitutional: Negative for chills and fever.  HENT: Positive for congestion, ear pain, sinus pressure and sore throat. Negative for ear discharge.   Respiratory: Positive for cough and shortness of breath.   Cardiovascular: Positive for chest pain. Negative for leg swelling.  Gastrointestinal: Positive for abdominal pain, nausea and vomiting. Negative for constipation and diarrhea.  Genitourinary: Negative for dysuria, frequency, hematuria and urgency.  All other systems reviewed and are negative.    Physical Exam Updated Vital Signs BP (!) 150/106   Pulse 71   Temp 98.7 F (37.1 C) (Oral)   Resp 15   LMP 09/01/2012   SpO2 100%   Physical Exam Vitals signs and nursing note reviewed.  Constitutional:      General: She is not in acute distress.    Appearance: She is well-developed.  HENT:     Head: Normocephalic and atraumatic.     Right Ear: No drainage, swelling or tenderness. A middle ear effusion  is present. Tympanic membrane is not erythematous.     Left Ear: No drainage, swelling or tenderness. A middle ear effusion is present. Tympanic membrane is not erythematous.     Ears:     Comments: No mastoid tenderness    Nose: Congestion present.     Comments: Right maxillary sinus tenderness    Mouth/Throat:     Mouth: Mucous membranes are moist. No oral lesions.     Pharynx: Uvula midline. Posterior oropharyngeal erythema present. No pharyngeal swelling or oropharyngeal exudate.     Tonsils: No tonsillar exudate or tonsillar  abscesses. Swelling: 2+ on the right. 2+ on the left.     Comments: No trismus or sublingual abnormalities.  No uvular deviation.  Tolerating secretions that difficulty.  Mild posterior pharyngeal erythema and 2+ tonsillar hypertrophy. Eyes:     General:        Right eye: No discharge.        Left eye: No discharge.     Conjunctiva/sclera: Conjunctivae normal.  Neck:     Musculoskeletal: Normal range of motion and neck supple.     Vascular: No JVD.     Trachea: No tracheal deviation.  Cardiovascular:     Rate and Rhythm: Normal rate and regular rhythm.     Heart sounds: Normal heart sounds.  Pulmonary:     Effort: Pulmonary effort is normal.     Comments: Globally diminished breath sounds.  Tenderness to palpation of the left anterior chest wall with no deformity, crepitus, ecchymosis, or flail segment. Chest:     Chest wall: Tenderness present.  Abdominal:     General: Bowel sounds are normal. There is no distension.     Palpations: Abdomen is soft.     Tenderness: There is abdominal tenderness in the left upper quadrant. There is no right CVA tenderness, left CVA tenderness, guarding or rebound. Negative signs include Murphy's sign.     Comments: Obese, Mild left upper quadrant tenderness.  Lymphadenopathy:     Cervical: Cervical adenopathy present.  Skin:    General: Skin is warm and dry.     Findings: No erythema.  Neurological:     Mental Status:  She is alert.  Psychiatric:        Behavior: Behavior normal.      ED Treatments / Results  Labs (all labs ordered are listed, but only abnormal results are displayed) Labs Reviewed  COMPREHENSIVE METABOLIC PANEL - Abnormal; Notable for the following components:      Result Value   Glucose, Bld 112 (*)    All other components within normal limits  CBC - Abnormal; Notable for the following components:   WBC 11.2 (*)    RBC 5.17 (*)    MCH 24.8 (*)    RDW 15.9 (*)    Platelets 424 (*)    All other components within normal limits  URINALYSIS, ROUTINE W REFLEX MICROSCOPIC - Abnormal; Notable for the following components:   Hgb urine dipstick SMALL (*)    Nitrite POSITIVE (*)    Leukocytes,Ua TRACE (*)    Bacteria, UA MANY (*)    All other components within normal limits  LIPASE, BLOOD  I-STAT BETA HCG BLOOD, ED ([facility], [facility], [facility] ONLY)    EKG None  Radiology Dg Chest 2 View  Result Date: 05/26/2018 CLINICAL DATA:  Cough. EXAM: CHEST - 2 VIEW COMPARISON:  Radiographs of March 01, 2018. FINDINGS: The heart size and mediastinal contours are within normal limits. Both lungs are clear. The visualized skeletal structures are unremarkable. IMPRESSION: No active cardiopulmonary disease. Electronically Signed   By: Marijo Conception, M.D.   On: 05/26/2018 17:25    Procedures Procedures (including critical care time)  Medications Ordered in ED Medications  ipratropium-albuterol (DUONEB) 0.5-2.5 (3) MG/3ML nebulizer solution 3 mL (3 mLs Nebulization Given 05/26/18 1812)  ondansetron (ZOFRAN-ODT) disintegrating tablet 4 mg (4 mg Oral Given 05/26/18 1809)  ketorolac (TORADOL) 30 MG/ML injection 30 mg (30 mg Intramuscular Given 05/26/18 1810)     Initial Impression / Assessment and Plan / ED Course  I have  reviewed the triage vital signs and the nursing notes.  Pertinent labs & imaging results that were available during my care of the patient were reviewed by me and considered in my  medical decision making (see chart for details).        Patient presenting for evaluation of cough, nasal congestion, sore throat, bilateral ear pain for 2 weeks with development of chest pains, shortness of breath, and abdominal pain with nausea and vomiting this week.  She is afebrile, vital signs are stable.  She is uncomfortable but nontoxic in appearance.  Lung sounds are globally diminished with some improvement after breathing treatment.  Chest x-ray shows no acute cardiopulmonary abnormalities.  Chest pain is reproducible on palpation and does not sound cardiac in etiology.  Doubt ACS/MI.  Doubt PE as symptoms sound more infectious in etiology.  She has some left upper quadrant discomfort on palpation but otherwise examination of the abdomen is benign.  Doubt acute surgical abdominal pathology.  Presentation more consistent with gastritis or viral illness.  Doubt obstruction, perforation, appendicitis, cholecystitis, or dissection.  Lab work reviewed by me shows mild nonspecific leukocytosis, no anemia, no metabolic derangements, no renal insufficiency.  UA does suggest UTI but she has no urinary symptoms.  We will culture.  Given duration and severity of symptoms, fevers at home, and evidence of UTI on UA, we will cover for UTI and possible sinusitis with course of Augmentin.  On reevaluation she is resting comfortably no apparent distress.  She is tolerating p.o. fluids without difficulty.  Serial abdominal examinations remain benign.  Discussed symptomatic therapy.  Recommend follow-up with PCP if symptoms persist.  Discussed strict ED return precautions. Pt verbalized understanding of and agreement with plan and is safe for discharge home at this time.   Final Clinical Impressions(s) / ED Diagnoses   Final diagnoses:  Acute maxillary sinusitis, recurrence not specified  Chest wall pain  Cough  Non-intractable vomiting with nausea, unspecified vomiting type  Left upper quadrant pain    Hypertension, unspecified type    ED Discharge Orders         Ordered    ondansetron (ZOFRAN ODT) 4 MG disintegrating tablet  Every 8 hours PRN     05/26/18 1943    amoxicillin (AMOXIL) 500 MG capsule  3 times daily     05/26/18 1943    benzonatate (TESSALON) 100 MG capsule  3 times daily PRN     05/26/18 1943           Renita Papa, PA-C 05/26/18 1953    Daleen Bo, MD 05/27/18 0031

## 2018-05-26 NOTE — Discharge Instructions (Signed)
Please take all of your antibiotics until finished!   You may develop abdominal discomfort or diarrhea from the antibiotic.  You may help offset this with probiotics which you can buy or get in yogurt. Do not eat  or take the probiotics until 2 hours after your antibiotic.   Take Zofran as needed for nausea.  Let this medicine dissolve under your tongue.  Wait around 10 to 15 minutes before having anything to eat or drink to get this medicine time to work.  Take [phone] mg of Tylenol every 6 hours as needed for pain/fever. Do not exceed 4000 mg of Tylenol daily.  Take Tessalon as needed for cough.  You can continue to use over-the-counter medicines for cough congestion, sore throat, and fever.  For sore throat I recommend warm water salt gargles, warm teas, honey, over-the-counter cough lozenges or Chloraseptic spray.  Drink plenty of fluids and get plenty of rest.  Follow-up with primary care physician in 2 to 3 days if symptoms persist.  Return to the emergency department if any concerning signs or symptoms develop such as persistent vomiting, worsening pain, persistent shortness of breath.

## 2018-05-29 LAB — URINE CULTURE: Culture: >=100000 — AB

## 2018-05-30 ENCOUNTER — Telehealth: Payer: Self-pay | Admitting: Emergency Medicine

## 2018-05-30 NOTE — Telephone Encounter (Signed)
Post ED Visit - Positive Culture Follow-up  Culture report reviewed by antimicrobial stewardship pharmacist: [facility] Team []  Elenor Quinones, Pharm.D. []  Heide Guile, Pharm.D., BCPS AQ-ID []  Parks Neptune, Pharm.D., BCPS []  Alycia Rossetti, Pharm.D., BCPS []  [place], Pharm.D., BCPS, AAHIVP []  Legrand Como, Pharm.D., BCPS, AAHIVP []  Salome Arnt, PharmD, BCPS []  Johnnette Gourd, PharmD, BCPS []  Hughes Better, PharmD, BCPS [x]  Leeroy Cha, PharmD []  Laqueta Linden, PharmD, BCPS []  Albertina Parr, PharmD  [facility] Team []  Leodis Sias, PharmD []  Lindell Spar, PharmD []  Royetta Asal, PharmD []  Graylin Shiver, Rph []  Rema Fendt) Glennon Mac, PharmD []  Arlyn Dunning, PharmD []  Netta Cedars, PharmD []  Dia Sitter, PharmD []  Leone Haven, PharmD []  Gretta Arab, PharmD []  Theodis Shove, PharmD []  Peggyann Juba, PharmD []  Reuel Boom, PharmD   Positive Urine culture Treated with Amoxicillin-Pot Clavulanate, organism sensitive to the same and no further patient follow-up is required at this time.  Larene Beach Kerrington [place]gh 05/30/2018, 1:56 PM

## 2018-07-24 ENCOUNTER — Encounter (HOSPITAL_COMMUNITY): Payer: Self-pay | Admitting: Emergency Medicine

## 2018-07-24 ENCOUNTER — Other Ambulatory Visit: Payer: Self-pay

## 2018-07-24 ENCOUNTER — Emergency Department (HOSPITAL_COMMUNITY): Payer: Self-pay

## 2018-07-24 ENCOUNTER — Emergency Department (HOSPITAL_COMMUNITY)
Admission: EM | Admit: 2018-07-24 | Discharge: 2018-07-24 | Disposition: A | Discharge status: Home / Self Care | Payer: Self-pay | Attending: Emergency Medicine | Admitting: Emergency Medicine

## 2018-07-24 DIAGNOSIS — R350 Frequency of micturition: Secondary | ICD-10-CM | POA: Insufficient documentation

## 2018-07-24 DIAGNOSIS — R0602 Shortness of breath: Secondary | ICD-10-CM | POA: Insufficient documentation

## 2018-07-24 DIAGNOSIS — F17210 Nicotine dependence, cigarettes, uncomplicated: Secondary | ICD-10-CM | POA: Insufficient documentation

## 2018-07-24 DIAGNOSIS — R1032 Left lower quadrant pain: Secondary | ICD-10-CM | POA: Insufficient documentation

## 2018-07-24 DIAGNOSIS — R531 Weakness: Secondary | ICD-10-CM | POA: Insufficient documentation

## 2018-07-24 DIAGNOSIS — R109 Unspecified abdominal pain: Secondary | ICD-10-CM

## 2018-07-24 LAB — URINALYSIS, ROUTINE W REFLEX MICROSCOPIC
Bilirubin Urine: NEGATIVE
Glucose, UA: NEGATIVE mg/dL
Ketones, ur: NEGATIVE mg/dL
Nitrite: NEGATIVE
Protein, ur: NEGATIVE mg/dL
Specific Gravity, Urine: 1.02 (ref 1.005–1.030)
pH: 5 (ref 5.0–8.0)

## 2018-07-24 LAB — PREGNANCY, URINE: Preg Test, Ur: NEGATIVE

## 2018-07-24 LAB — COMPREHENSIVE METABOLIC PANEL
ALT: 22 U/L (ref 0–44)
AST: 20 U/L (ref 15–41)
Albumin: 3.5 g/dL (ref 3.5–5.0)
Alkaline Phosphatase: 79 U/L (ref 38–126)
Anion gap: 10 (ref 5–15)
BUN: 10 mg/dL (ref 6–20)
CO2: 26 mmol/L (ref 22–32)
Calcium: 9.6 mg/dL (ref 8.9–10.3)
Chloride: 104 mmol/L (ref 98–111)
Creatinine, Ser: 0.86 mg/dL (ref 0.44–1.00)
GFR calc Af Amer: >60 mL/min (ref >60)
GFR calc non Af Amer: >60 mL/min (ref >60)
Glucose, Bld: 107 mg/dL — ABNORMAL HIGH (ref 70–99)
Potassium: 3.8 mmol/L (ref 3.5–5.1)
Sodium: 140 mmol/L (ref 135–145)
Total Bilirubin: 0.2 mg/dL — ABNORMAL LOW (ref 0.3–1.2)
Total Protein: 7 g/dL (ref 6.5–8.1)

## 2018-07-24 LAB — CBC WITH DIFFERENTIAL/PLATELET
Abs Immature Granulocytes: 0.04 10*3/uL (ref 0.00–0.07)
Basophils Absolute: 0 10*3/uL (ref 0.0–0.1)
Basophils Relative: 0 %
Eosinophils Absolute: 0.2 10*3/uL (ref 0.0–0.5)
Eosinophils Relative: 2 %
HCT: 40.2 % (ref 36.0–46.0)
Hemoglobin: 12.6 g/dL (ref 12.0–15.0)
Immature Granulocytes: 0 %
Lymphocytes Relative: 31 %
Lymphs Abs: 3 10*3/uL (ref 0.7–4.0)
MCH: 24.6 pg — ABNORMAL LOW (ref 26.0–34.0)
MCHC: 31.3 g/dL (ref 30.0–36.0)
MCV: 78.4 fL — ABNORMAL LOW (ref 80.0–100.0)
Monocytes Absolute: 0.6 10*3/uL (ref 0.1–1.0)
Monocytes Relative: 6 %
Neutro Abs: 5.9 10*3/uL (ref 1.7–7.7)
Neutrophils Relative %: 61 %
Platelets: 327 10*3/uL (ref 150–400)
RBC: 5.13 MIL/uL — ABNORMAL HIGH (ref 3.87–5.11)
RDW: 15.3 % (ref 11.5–15.5)
WBC: 9.8 10*3/uL (ref 4.0–10.5)
nRBC: 0 % (ref 0.0–0.2)

## 2018-07-24 LAB — TROPONIN I: Troponin I: <0.03 ng/mL (ref <0.03)

## 2018-07-24 LAB — D-DIMER, QUANTITATIVE (NOT AT ARMC): D-Dimer, Quant: 0.43 ug/mL-FEU (ref 0.00–0.50)

## 2018-07-24 MED ORDER — NAPROXEN 500 MG PO TABS: 500.0000 mg | ORAL_TABLET | Freq: Two times a day (BID) | ORAL | 0 refills | Status: DC

## 2018-07-24 MED ORDER — MORPHINE SULFATE (PF) 4 MG/ML IV SOLN
4.0000 mg | Freq: Once | INTRAVENOUS | Status: AC
  Administered 2018-07-24: 4 mg via INTRAVENOUS
  Filled 2018-07-24: qty 1

## 2018-07-24 MED ORDER — SODIUM CHLORIDE 0.9 % IV BOLUS
1000.0000 mL | Freq: Once | INTRAVENOUS | Status: AC
  Administered 2018-07-24: 1000 mL via INTRAVENOUS

## 2018-07-24 MED ORDER — IOHEXOL 300 MG/ML  SOLN
100.0000 mL | Freq: Once | INTRAMUSCULAR | Status: AC | PRN
  Administered 2018-07-24: 20:00:00 100 mL via INTRAVENOUS

## 2018-07-24 MED ORDER — KETOROLAC TROMETHAMINE 30 MG/ML IJ SOLN
30.0000 mg | Freq: Once | INTRAMUSCULAR | Status: AC
  Administered 2018-07-24: 30 mg via INTRAVENOUS
  Filled 2018-07-24: qty 1

## 2018-07-24 MED ORDER — SULFAMETHOXAZOLE-TRIMETHOPRIM 800-160 MG PO TABS: 1.0000 | ORAL_TABLET | Freq: Two times a day (BID) | ORAL | 0 refills | Status: AC

## 2018-07-24 NOTE — ED Triage Notes (Addendum)
Patient arrived from home via EMS with reports of left side pain X 2 days. She reports taking BC-powder without getting any result She states "it hurts when I take a deep breathe."

## 2018-07-24 NOTE — ED Provider Notes (Addendum)
[facility] EMERGENCY DEPARTMENT Provider Note   CSN: [id number] Arrival date & time: 07/24/18  1604    History   Chief Complaint Chief Complaint  Patient presents with   Flank Pain    HPI Sabrina Harding is a 38 y.o. female with a PMH of gallstones, anemia, pyelonephritis, and PID presenting with constant left sided flank pain onset 2 days ago. Patient reports pain is worse with deep breaths and movement. Patient states she has tried [facility] powder without relief. Patient reports intermittent shortness of breath, but denies chest pain. Patient denies cough, congestion, fever, sore throat, recent travel, or sick exposures. Patient reports urinary frequency, but denies dysuria, hematuria, or vaginal discharge. Patient reports she has had a cholecystectomy in the past. Patient denies leg edema/pain, recent surgery, or recent travel. Patient reports LMP was years ago. Patient denies an injury. Patient reports she had a DVT as a child. Patient denies taking blood thinners. Patient reports generalized weakness, but denies dizziness, headaches, syncope, or vision changes. Patient denies nausea, vomiting, diarrhea, or constipation. Patient reports last BM was this morning and it was normal. Patient denies a history of kidney stones.      HPI  Past Medical History:  Diagnosis Date   Anemia    Gall stones    Gonorrhea    Headache(784.0)    Heart murmur    childhood   Trichomonas contact     Patient Active Problem List   Diagnosis Date Noted   Oral herpes 01/10/2016   Pelvic inflammatory disease (PID) 01/07/2016   Gonorrhea in female 01/07/2016   Pyelonephritis 01/06/2016    Past Surgical History:  Procedure Laterality Date   CHOLECYSTECTOMY  10/17/2011   Procedure: LAPAROSCOPIC CHOLECYSTECTOMY;  Surgeon: Harl Bowie, MD;  Location: [facility];  Service: General;  Laterality: N/A;   TONSILLECTOMY     TUBAL LIGATION       OB History    Gravida    6   Para  0   Term  0   Preterm      AB  3   Living  3     SAB  3   TAB      Ectopic      Multiple      Live Births  3            Home Medications    Prior to Admission medications   Medication Sig Start Date End Date Taking? Authorizing Provider  Aspirin-Salicylamide-Caffeine (BC HEADACHE POWDER PO) Take 2 packets by mouth every 3 (three) hours as needed (pain/headache).   Yes [provider]  naproxen (NAPROSYN) 500 MG tablet Take 1 tablet (500 mg total) by mouth 2 (two) times daily. 07/24/18   Darlin Drop P, PA-C  sulfamethoxazole-trimethoprim (BACTRIM DS) 800-160 MG tablet Take 1 tablet by mouth 2 (two) times daily for 7 days. 07/24/18 07/31/18  Arville Lime, PA-C    Family History Family History  Problem Relation Age of Onset   Hypertension Father     Social History Social History   Tobacco Use   Smoking status: Current Every Day Smoker    Packs/day: 1.00    Years: 8.00    Pack years: 8.00    Types: Cigarettes   Smokeless tobacco: Never Used  Substance Use Topics   Alcohol use: Yes    Alcohol/week: 0.0 standard drinks    Comment: occasional   Drug use: No     Allergies   Patient  has no known allergies.   Review of Systems Review of Systems  Constitutional: Negative for activity change, appetite change, chills, fever and unexpected weight change.  HENT: Negative for congestion, rhinorrhea and sore throat.   Eyes: Negative for visual disturbance.  Respiratory: Positive for shortness of breath. Negative for cough.   Cardiovascular: Negative for chest pain.  Gastrointestinal: Negative for abdominal pain, constipation, diarrhea, nausea and vomiting.  Endocrine: Negative for polydipsia, polyphagia and polyuria.  Genitourinary: Positive for flank pain and frequency. Negative for dysuria, hematuria and vaginal discharge.  Musculoskeletal: Negative for back pain.  Skin: Negative for rash.  Allergic/Immunologic: Negative for  immunocompromised state.  Neurological: Positive for weakness. Negative for dizziness, syncope and headaches.  Psychiatric/Behavioral: The patient is not nervous/anxious.     Physical Exam Updated Vital Signs BP 102/66    Pulse 78    Temp 98.1 F (36.7 C) (Oral)    Resp 14    Ht 5\' 6"  (1.676 m)    Wt 104.3 kg    LMP 09/01/2012 Comment: Pt states she has not had a period since she was "22-something"   SpO2 98%    BMI 37.12 kg/m   Physical Exam Vitals signs and nursing note reviewed.  Constitutional:      General: She is not in acute distress.    Appearance: She is well-developed. She is not diaphoretic.  HENT:     Head: Normocephalic and atraumatic.  Neck:     Musculoskeletal: Normal range of motion.  Cardiovascular:     Rate and Rhythm: Normal rate and regular rhythm.     Heart sounds: Normal heart sounds. No murmur. No friction rub. No gallop.   Pulmonary:     Effort: Pulmonary effort is normal. No accessory muscle usage or respiratory distress.     Breath sounds: Normal breath sounds. No decreased breath sounds, wheezing, rhonchi or rales.  Chest:     Chest wall: No tenderness.  Abdominal:     Palpations: Abdomen is soft.     Tenderness: There is abdominal tenderness (Left flank tenderness to palpation. No skin changes noted.). There is left CVA tenderness. There is no right CVA tenderness, guarding or rebound.  Musculoskeletal: Normal range of motion.  Skin:    General: Skin is warm.     Findings: No erythema or rash.  Neurological:     Mental Status: She is alert.    Mental Status:  Alert, oriented, thought content appropriate, able to give a coherent history. Speech fluent without evidence of aphasia. Able to follow 2 step commands without difficulty.  Cranial Nerves:  II:  Peripheral visual fields grossly normal, pupils equal, round, reactive to light III,IV, VI: ptosis not present, extra-ocular motions intact bilaterally  V,VII: smile symmetric, facial light touch  sensation equal VIII: hearing grossly normal to voice  IX,X: symmetric elevation of soft palate, uvula elevates symmetrically  XI: bilateral shoulder shrug symmetric and strong XII: midline tongue extension without fassiculations Motor:  Normal tone. 5/5 in upper and lower extremities bilaterally including strong and equal grip strength and dorsiflexion/plantar flexion Sensory: light touch normal in all extremities.  Deep Tendon Reflexes: 2+ and symmetric in the biceps and patella Cerebellar: normal finger-to-nose with bilateral upper extremities Gait: normal gait and balance.  CV: distal pulses palpable throughout    ED Treatments / Results  Labs (all labs ordered are listed, but only abnormal results are displayed) Labs Reviewed  COMPREHENSIVE METABOLIC PANEL - Abnormal; Notable for the following components:  Result Value   Glucose, Bld 107 (*)    Total Bilirubin 0.2 (*)    All other components within normal limits  CBC WITH DIFFERENTIAL/PLATELET - Abnormal; Notable for the following components:   RBC 5.13 (*)    MCV 78.4 (*)    MCH 24.6 (*)    All other components within normal limits  URINALYSIS, ROUTINE W REFLEX MICROSCOPIC - Abnormal; Notable for the following components:   APPearance HAZY (*)    Hgb urine dipstick SMALL (*)    Leukocytes,Ua SMALL (*)    Bacteria, UA MANY (*)    All other components within normal limits  URINE CULTURE  D-DIMER, QUANTITATIVE (NOT AT [facility])  PREGNANCY, URINE  TROPONIN I    EKG EKG Interpretation  Date/Time:  Friday July 24 2018 17:06:13 EDT Ventricular Rate:  79 PR Interval:    QRS Duration: 100 QT Interval:  371 QTC Calculation: 426 R Axis:   132 Text Interpretation:  Right and left arm electrode reversal, interpretation assumes no reversal Sinus rhythm Atrial premature complex Inferolateral infarct, old Baseline wander in lead(s) V2 No significant change since last tracing Confirmed by Deno Etienne [phone]) on 07/24/2018  6:04:33 PM   Radiology Dg Chest 2 View  Result Date: 07/24/2018 CLINICAL DATA:  Shortness of breath EXAM: CHEST - 2 VIEW COMPARISON:  05/26/2018 FINDINGS: The heart size and mediastinal contours are within normal limits. Both lungs are clear. The visualized skeletal structures are unremarkable. IMPRESSION: No active cardiopulmonary disease. Electronically Signed   By: Kathreen Devoid   On: 07/24/2018 17:26   Ct Abdomen Pelvis W Contrast  Result Date: 07/24/2018 CLINICAL DATA:  Left-sided pain for 2 days EXAM: CT ABDOMEN AND PELVIS WITH CONTRAST TECHNIQUE: Multidetector CT imaging of the abdomen and pelvis was performed using the standard protocol following bolus administration of intravenous contrast. CONTRAST:  149mL OMNIPAQUE IOHEXOL 300 MG/ML  SOLN COMPARISON:  07/16/2017 FINDINGS: Lower chest: No acute abnormality. Hepatobiliary: No focal liver abnormality is seen. Status post cholecystectomy. No biliary dilatation. Pancreas: Unremarkable. No pancreatic ductal dilatation or surrounding inflammatory changes. Spleen: Normal in size without focal abnormality. Adrenals/Urinary Tract: Adrenal glands are within normal limits. Kidneys are well visualized bilaterally. 1 cm renal cyst is noted on the left. No obstructive changes are seen. The bladder is decompressed. Stomach/Bowel: The appendix is within normal limits. The colon, small bowel and stomach are unremarkable. Vascular/Lymphatic: No significant vascular findings are present. No enlarged abdominal or pelvic lymph nodes. Reproductive: Uterus and bilateral adnexa are unremarkable. Other: No abdominal wall hernia or abnormality. No abdominopelvic ascites. Musculoskeletal: No acute or significant osseous findings. IMPRESSION: 1 cm left renal cyst. No acute abnormality noted. Electronically Signed   By: Inez Catalina M.D.   On: 07/24/2018 20:30    Procedures Procedures (including critical care time)  Medications Ordered in ED Medications  ketorolac  (TORADOL) 30 MG/ML injection 30 mg (30 mg Intravenous Given 07/24/18 1809)  sodium chloride 0.9 % bolus 1,000 mL (0 mLs Intravenous Stopped 07/24/18 2318)  morphine 4 MG/ML injection 4 mg (4 mg Intravenous Given 07/24/18 1957)  iohexol (OMNIPAQUE) 300 MG/ML solution 100 mL (100 mLs Intravenous Contrast Given 07/24/18 1945)     Initial Impression / Assessment and Plan / ED Course  I have reviewed the triage vital signs and the nursing notes.  Pertinent labs & imaging results that were available during my care of the patient were reviewed by me and considered in my medical decision making (see chart for details).  Clinical Course as of Jul 24 2319  Fri Jul 24, 2018  1740 No active cardiopulmonary disease noted on CXR.  DG Chest 2 View [AH]  1756 WBCs are within normal limits.  WBC: 9.8 [AH]  1923 Patient continues to endorse severe left flank pain. Will order CT abdomen and pain medicine.    [AH]  2033 CT abdomen reveals 1 cm left renal cyst. No acute abnormality noted.    CT Abdomen Pelvis W Contrast [AH]  2108 Patient reports pain has improved while in the ER.   [AH]  2302 Flank pain has resolved while in the ER. Patient is comfortable in room in no acute distress.    [AH]  2308 UA reveals small leukocytes, many bacteria, WBCs, and Hgb. Will order urine culture.   Chalmers GuestMarland Kitchen): SMALL [AH]    Clinical Course User Index [AH] Arville Lime, PA-C       Patient presents with left flank pain and intermittent shortness of breath. Vitals, labs, and imaging reviewed. Oxygen saturation has remained 100% while in the ER. EKG without acute changes, troponin negative, CXR negative, and d dimer negative. CT abdomen reveals left renal cyst. UA reveals small leukocytes, bacteria, WBCs, and Hgb. Ordered urine culture. Given CVA tenderness, urinary frequency, and UA findings, will prescribe bactrim. WBCs are within normal limits and patient is afebrile. Pain has been managed in the ER and IVF  provided. Patient is stable for discharge. Discussed return precautions with patient. Advised patient to follow up with PCP. Patient states she understands and agrees with plan.   Final Clinical Impressions(s) / ED Diagnoses   Final diagnoses:  Left flank pain  Shortness of breath    ED Discharge Orders         Ordered    naproxen (NAPROSYN) 500 MG tablet  2 times daily     07/24/18 2306    sulfamethoxazole-trimethoprim (BACTRIM DS) 800-160 MG tablet  2 times daily     07/24/18 2306           Arville Lime, PA-C 07/24/18 2315    Arville Lime, PA-C 07/24/18 2321    Deno Etienne, DO 07/25/18 1117

## 2018-07-24 NOTE — Discharge Instructions (Addendum)
You have been seen today for flank pain. Please read and follow all provided instructions.   1. Medications: naproxen for pain (do not take this with other anti-inflammatories, stop taking BC powder), tylenol, and bactrim (antibiotic), usual home medications 2. Treatment: rest, drink plenty of fluids 3. Follow Up: Please follow up with your primary doctor in 2 days for discussion of your diagnoses and further evaluation after today's visit; if you do not have a primary care doctor use the resource guide provided to find one; Please return to the ER for any new or worsening symptoms. Please obtain all of your results from medical records or have your doctors office obtain the results - share them with your doctor - you should be seen at your doctors office. Call today to arrange your follow up.   Take medications as prescribed. Please review all of the medicines and only take them if you do not have an allergy to them. Return to the emergency room for worsening condition or new concerning symptoms. Follow up with your regular doctor. If you don't have a regular doctor use one of the numbers below to establish a primary care doctor.  Please be aware that if you are taking birth control pills, taking other prescriptions, ESPECIALLY ANTIBIOTICS may make the birth control ineffective - if this is the case, either do not engage in sexual activity or use alternative methods of birth control such as condoms until you have finished the medicine and your family doctor says it is OK to restart them. If you are on a blood thinner such as COUMADIN, be aware that any other medicine that you take may cause the coumadin to either work too much, or not enough - you should have your coumadin level rechecked in next 7 days if this is the case.  ?  It is also a possibility that you have an allergic reaction to any of the medicines that you have been prescribed - Everybody reacts differently to medications and while MOST  people have no trouble with most medicines, you may have a reaction such as nausea, vomiting, rash, swelling, shortness of breath. If this is the case, please stop taking the medicine immediately and contact your physician.  ?  You should return to the ER if you develop severe or worsening symptoms.   Emergency Department Resource Guide 1) Find a Doctor and Pay Out of Pocket Although you won't have to find out who is covered by your insurance plan, it is a good idea to ask around and get recommendations. You will then need to call the office and see if the doctor you have chosen will accept you as a new patient and what types of options they offer for patients who are self-pay. Some doctors offer discounts or will set up payment plans for their patients who do not have insurance, but you will need to ask so you aren't surprised when you get to your appointment.  2) Contact Your Local Health Department Not all health departments have doctors that can see patients for sick visits, but many do, so it is worth a call to see if yours does. If you don't know where your local health department is, you can check in your phone book. The CDC also has a tool to help you locate your state's health department, and many state websites also have listings of all of their local health departments.  3) Find a [facility] If your illness is not likely to be very  severe or complicated, you may want to try a walk in clinic. These are popping up all over the country in pharmacies, drugstores, and shopping centers. They're usually staffed by nurse practitioners or physician assistants that have been trained to treat common illnesses and complaints. They're usually fairly quick and inexpensive. However, if you have serious medical issues or chronic medical problems, these are probably not your best option.  No Primary Care Doctor: Call Health Connect at  [phone] - they can help you locate a primary care doctor that   accepts your insurance, provides certain services, etc. Physician Referral Service- [phone]  Chronic Pain Problems: Organization         Address  Phone   Notes  [facility]  [phone] Patients need to be referred by their primary care doctor.   Medication Assistance: Organization         Address  Phone   Notes  [facility] [address] [place], [place] [address] [phone] --Must be a resident of [facility] -- Must have NO insurance coverage whatsoever (no Medicaid/ Medicare, etc.) -- The pt. MUST have a primary care doctor that directs their care regularly and follows them in the community   [facility]  [phone]   [employer]  [phone]    Agencies that provide inexpensive medical care: Organization         Address  Phone   Notes  [place]  [phone]   [facility]    [phone]   [facility] [place], [place] [address] [phone]   [place] [address], [place] [phone]   [facility]    [phone]   [facility]    [phone]   [place] [address], [place] Phone:  [phone], Fax:  [phone] Hours of Operation:  9 am - 6 pm, M-F.  Also accepts Medicaid/Medicare and self-pay.  [facility] for [place], [address], [place] Phone: [phone], Fax: [phone]. Hours of Operation:  8:30 am - 5:30 pm, M-F.  Also accepts Medicaid and self-pay.  [facility] [place] [address] [place] Phone: [phone]   [place], [place], [place] [phone], Ext. [phone] Mondays & Thursdays: 7-9 AM.  First 15 patients are seen on a first come, first serve basis.    [place] Providers:  Organization          Address  Phone   Notes  [facility] [address],  [phone] Also accepts self-pay patients.  [facility] [address] [place], [place]  [phone]   [place], [address], [place] [phone]   [facility] [address] [place] [phone]   Lucianne Lei [address], [place]   [phone] Only accepts [place] patients after they have their name applied to their card.   Self-Pay (no insurance) in [facility]:  Organization         Address  Phone   Notes  Sickle Cell Patients, [facility] [place] [phone]   [facility] [address]  [address], [place] [phone]   [facility] [place]  [place], [place], [place] [phone]   [facility]/Dr. Osei-Bonsu  [address] [place] or [address], [place] [phone] Phone number for both [place] and [place] locations is the same.  [facility] [address], [place] [phone]   [facility] [address], [place] or [address] [phone]   [facility] [address], [place] [phone], phone; [phone], fax Sees patients 1st and 3rd Saturday of every month.  Must not qualify for public or private insurance (i.e. Medicaid, Medicare, [place] Health Choice, Veterans' Benefits)  Household income should be no more than 200% of the poverty level The clinic cannot treat you if you are pregnant or think you are pregnant  Sexually transmitted diseases are not treated at the clinic.

## 2018-07-24 NOTE — ED Notes (Signed)
Pt talking on the phone. 

## 2018-07-24 NOTE — ED Notes (Signed)
Iv not running in she has kept her rt arm bent up  Most of the time.  Now the iv will not run   Food and drink given

## 2018-07-27 LAB — URINE CULTURE: Culture: >=100000 — AB

## 2018-12-13 ENCOUNTER — Emergency Department (HOSPITAL_COMMUNITY)
Admission: EM | Admit: 2018-12-13 | Discharge: 2018-12-14 | Disposition: A | Discharge status: Home / Self Care | Payer: Self-pay | Attending: Emergency Medicine | Admitting: Emergency Medicine

## 2018-12-13 ENCOUNTER — Encounter (HOSPITAL_COMMUNITY): Payer: Self-pay | Admitting: Emergency Medicine

## 2018-12-13 ENCOUNTER — Emergency Department (HOSPITAL_COMMUNITY): Payer: Self-pay

## 2018-12-13 ENCOUNTER — Other Ambulatory Visit: Payer: Self-pay

## 2018-12-13 DIAGNOSIS — N10 Acute tubulo-interstitial nephritis: Secondary | ICD-10-CM | POA: Insufficient documentation

## 2018-12-13 DIAGNOSIS — F17210 Nicotine dependence, cigarettes, uncomplicated: Secondary | ICD-10-CM | POA: Insufficient documentation

## 2018-12-13 DIAGNOSIS — R200 Anesthesia of skin: Secondary | ICD-10-CM | POA: Insufficient documentation

## 2018-12-13 DIAGNOSIS — N12 Tubulo-interstitial nephritis, not specified as acute or chronic: Secondary | ICD-10-CM

## 2018-12-13 LAB — COMPREHENSIVE METABOLIC PANEL
ALT: 18 U/L (ref 0–44)
AST: 16 U/L (ref 15–41)
Albumin: 3.3 g/dL — ABNORMAL LOW (ref 3.5–5.0)
Alkaline Phosphatase: 80 U/L (ref 38–126)
Anion gap: 7 (ref 5–15)
BUN: 10 mg/dL (ref 6–20)
CO2: 28 mmol/L (ref 22–32)
Calcium: 8.8 mg/dL — ABNORMAL LOW (ref 8.9–10.3)
Chloride: 106 mmol/L (ref 98–111)
Creatinine, Ser: 0.79 mg/dL (ref 0.44–1.00)
GFR calc Af Amer: >60 mL/min (ref >60)
GFR calc non Af Amer: >60 mL/min (ref >60)
Glucose, Bld: 100 mg/dL — ABNORMAL HIGH (ref 70–99)
Potassium: 4 mmol/L (ref 3.5–5.1)
Sodium: 141 mmol/L (ref 135–145)
Total Bilirubin: 0.4 mg/dL (ref 0.3–1.2)
Total Protein: 6.7 g/dL (ref 6.5–8.1)

## 2018-12-13 LAB — CBC WITH DIFFERENTIAL/PLATELET
Abs Immature Granulocytes: 0.03 10*3/uL (ref 0.00–0.07)
Basophils Absolute: 0 10*3/uL (ref 0.0–0.1)
Basophils Relative: 0 %
Eosinophils Absolute: 0.4 10*3/uL (ref 0.0–0.5)
Eosinophils Relative: 4 %
HCT: 37.7 % (ref 36.0–46.0)
Hemoglobin: 11.9 g/dL — ABNORMAL LOW (ref 12.0–15.0)
Immature Granulocytes: 0 %
Lymphocytes Relative: 27 %
Lymphs Abs: 2.4 10*3/uL (ref 0.7–4.0)
MCH: 24.8 pg — ABNORMAL LOW (ref 26.0–34.0)
MCHC: 31.6 g/dL (ref 30.0–36.0)
MCV: 78.7 fL — ABNORMAL LOW (ref 80.0–100.0)
Monocytes Absolute: 0.8 10*3/uL (ref 0.1–1.0)
Monocytes Relative: 9 %
Neutro Abs: 5.4 10*3/uL (ref 1.7–7.7)
Neutrophils Relative %: 60 %
Platelets: 344 10*3/uL (ref 150–400)
RBC: 4.79 MIL/uL (ref 3.87–5.11)
RDW: 15.7 % — ABNORMAL HIGH (ref 11.5–15.5)
WBC: 9 10*3/uL (ref 4.0–10.5)
nRBC: 0 % (ref 0.0–0.2)

## 2018-12-13 LAB — URINALYSIS, ROUTINE W REFLEX MICROSCOPIC
Bilirubin Urine: NEGATIVE
Glucose, UA: NEGATIVE mg/dL
Ketones, ur: NEGATIVE mg/dL
Nitrite: POSITIVE — AB
Protein, ur: NEGATIVE mg/dL
Specific Gravity, Urine: 1.021 (ref 1.005–1.030)
pH: 5 (ref 5.0–8.0)

## 2018-12-13 LAB — POC URINE PREG, ED: Preg Test, Ur: NEGATIVE

## 2018-12-13 MED ORDER — MORPHINE SULFATE (PF) 4 MG/ML IV SOLN: 4.0000 mg | Freq: Once | INTRAVENOUS | Status: DC

## 2018-12-13 MED ORDER — METHOCARBAMOL 500 MG PO TABS
500.0000 mg | ORAL_TABLET | Freq: Once | ORAL | Status: AC
  Administered 2018-12-13: 500 mg via ORAL
  Filled 2018-12-13: qty 1

## 2018-12-13 MED ORDER — FENTANYL CITRATE (PF) 100 MCG/2ML IJ SOLN: 50.0000 ug | Freq: Once | INTRAMUSCULAR | Status: DC

## 2018-12-13 MED ORDER — CEPHALEXIN 500 MG PO CAPS: 500.0000 mg | ORAL_CAPSULE | Freq: Four times a day (QID) | ORAL | 0 refills | Status: DC

## 2018-12-13 MED ORDER — SODIUM CHLORIDE 0.9 % IV SOLN: 1.0000 g | Freq: Once | INTRAVENOUS | Status: DC

## 2018-12-13 MED ORDER — KETOROLAC TROMETHAMINE 30 MG/ML IJ SOLN
30.0000 mg | Freq: Once | INTRAMUSCULAR | Status: AC
  Administered 2018-12-13: 30 mg via INTRAVENOUS
  Filled 2018-12-13: qty 1

## 2018-12-13 MED ORDER — CEPHALEXIN 250 MG PO CAPS
500.0000 mg | ORAL_CAPSULE | Freq: Once | ORAL | Status: AC
  Administered 2018-12-14: 500 mg via ORAL
  Filled 2018-12-13: qty 2

## 2018-12-13 MED ORDER — HYDROCODONE-ACETAMINOPHEN 5-325 MG PO TABS: 1.0000 | ORAL_TABLET | Freq: Four times a day (QID) | ORAL | 0 refills | Status: DC | PRN

## 2018-12-13 MED ORDER — OXYCODONE-ACETAMINOPHEN 5-325 MG PO TABS
1.0000 | ORAL_TABLET | Freq: Once | ORAL | Status: AC
  Administered 2018-12-13: 1 via ORAL
  Filled 2018-12-13: qty 1

## 2018-12-13 NOTE — ED Triage Notes (Signed)
Pt c/o pain in left lower back radiating around to left hip onset yesterday worse today.  Pt st's feels like she is having spasms.  No known injiury

## 2018-12-13 NOTE — ED Provider Notes (Signed)
[facility] EMERGENCY DEPARTMENT Provider Note   CSN: [id number] Arrival date & time: 12/13/18  1733     History   Chief Complaint Chief Complaint  Patient presents with  . Back Pain    HPI Sabrina Harding is a 38 y.o. female.     The history is provided by the patient and medical records. No language interpreter was used.  Back Pain Associated symptoms: numbness   Associated symptoms: no dysuria, no pelvic pain and no weakness    Sabrina Harding is a 38 y.o. female  with a PMH as listed below who presents to the Emergency Department complaining of left sided low back pain which began yesterday. Pain is described as a constant pressure which will intermittently radiate to the left flank. Has a hx of frequent UTI's / pyelo, but denies any urinary symptoms today. No n/v/d. No fevers/chills. Pain is worse with certain movements and better when still. No known injury or inciting event, but does work on her feet all day for 12 hours on average. She reports tingling to entire left leg earlier today while ambulating. Patient denies upper back or neck pain. No, weakness, urinary or bowel retention/incontinence. No history of cancer, IVDU, or recent spinal procedures.    Past Medical History:  Diagnosis Date  . Anemia   . Gall stones   . Gonorrhea   . Headache(784.0)   . Heart murmur    childhood  . Trichomonas contact     Patient Active Problem List   Diagnosis Date Noted  . Oral herpes 01/10/2016  . Pelvic inflammatory disease (PID) 01/07/2016  . Gonorrhea in female 01/07/2016  . Pyelonephritis 01/06/2016    Past Surgical History:  Procedure Laterality Date  . CHOLECYSTECTOMY  10/17/2011   Procedure: LAPAROSCOPIC CHOLECYSTECTOMY;  Surgeon: Harl Bowie, MD;  Location: [facility];  Service: General;  Laterality: N/A;  . TONSILLECTOMY    . TUBAL LIGATION       OB History    Gravida  6   Para  0   Term  0   Preterm      AB  3   Living  3     SAB  3   TAB      Ectopic      Multiple      Live Births  3            Home Medications    Prior to Admission medications   Medication Sig Start Date End Date Taking? Authorizing Provider  Aspirin-Salicylamide-Caffeine (BC HEADACHE POWDER PO) Take 1 packet by mouth as needed (pain/headache).    Yes [provider]  cephALEXin (KEFLEX) 500 MG capsule Take 1 capsule (500 mg total) by mouth 4 (four) times daily. 12/13/18   , Ozella Almond, PA-C  HYDROcodone-acetaminophen (NORCO) 5-325 MG tablet Take 1 tablet by mouth every 6 (six) hours as needed for moderate pain or severe pain. 12/13/18   , Ozella Almond, PA-C  naproxen (NAPROSYN) 500 MG tablet Take 1 tablet (500 mg total) by mouth 2 (two) times daily. Patient not taking: Reported on 12/13/2018 07/24/18   Arville Lime, PA-C    Family History Family History  Problem Relation Age of Onset  . Hypertension Father     Social History Social History   Tobacco Use  . Smoking status: Current Every Day Smoker    Packs/day: 1.00    Years: 8.00    Pack years: 8.00    Types:  Cigarettes  . Smokeless tobacco: Never Used  Substance Use Topics  . Alcohol use: Yes    Alcohol/week: 0.0 standard drinks    Comment: occasional  . Drug use: No     Allergies   Patient has no known allergies.   Review of Systems Review of Systems  Genitourinary: Positive for flank pain. Negative for difficulty urinating, dysuria, frequency, pelvic pain, vaginal bleeding, vaginal discharge and vaginal pain.  Musculoskeletal: Positive for back pain.  Neurological: Positive for numbness. Negative for weakness.  All other systems reviewed and are negative.    Physical Exam Updated Vital Signs BP 128/63 (BP Location: Right Arm)   Pulse 89   Temp 98.8 F (37.1 C) (Oral)   Resp 16   Ht 5\' 6"  (1.676 m)   Wt 108.9 kg   LMP 09/01/2012 Comment: Pt states she has not had a period since she was "22-something"  BMI  38.74 kg/m   Physical Exam Vitals signs and nursing note reviewed.  Constitutional:      General: She is not in acute distress.    Appearance: She is well-developed.  HENT:     Head: Normocephalic and atraumatic.  Neck:     Musculoskeletal: Neck supple.  Cardiovascular:     Rate and Rhythm: Normal rate and regular rhythm.     Heart sounds: Normal heart sounds. No murmur.  Pulmonary:     Effort: Pulmonary effort is normal. No respiratory distress.     Breath sounds: Normal breath sounds.  Abdominal:     General: There is no distension.     Palpations: Abdomen is soft.     Comments: Tenderness to the left low back and the left flank, but no abdominal tenderness.  Musculoskeletal:     Comments: No midline C/T/L spine tenderness. She is tender to the left paraspinal musculature.  Straight leg raises are negative bilaterally for radicular symptoms.  She does have full range of motion and 5/5 strength in lower extremities are palpable.  2+ DP's.  Subjective decreased sensation to light touch throughout left lower extremity when compared to the right.  Skin:    General: Skin is warm and dry.  Neurological:     Mental Status: She is alert and oriented to person, place, and time.      ED Treatments / Results  Labs (all labs ordered are listed, but only abnormal results are displayed) Labs Reviewed  URINALYSIS, ROUTINE W REFLEX MICROSCOPIC - Abnormal; Notable for the following components:      Result Value   APPearance HAZY (*)    Hgb urine dipstick SMALL (*)    Nitrite POSITIVE (*)    Leukocytes,Ua SMALL (*)    Bacteria, UA MANY (*)    All other components within normal limits  CBC WITH DIFFERENTIAL/PLATELET - Abnormal; Notable for the following components:   Hemoglobin 11.9 (*)    MCV 78.7 (*)    MCH 24.8 (*)    RDW 15.7 (*)    All other components within normal limits  COMPREHENSIVE METABOLIC PANEL - Abnormal; Notable for the following components:   Glucose, Bld 100 (*)     Calcium 8.8 (*)    Albumin 3.3 (*)    All other components within normal limits  URINE CULTURE  POC URINE PREG, ED    EKG None  Radiology Dg Lumbar Spine Complete  Result Date: 12/13/2018 CLINICAL DATA:  Back pain EXAM: LUMBAR SPINE - COMPLETE 4+ VIEW COMPARISON:  None. FINDINGS: There is no evidence  of lumbar spine fracture. Alignment is normal. Intervertebral disc spaces are maintained. IMPRESSION: No acute fracture or malalignment of the spine. Electronically Signed   By: Prudencio Pair M.D.   On: 12/13/2018 22:51    Procedures Procedures (including critical care time)  Medications Ordered in ED Medications  cephALEXin (KEFLEX) capsule 500 mg (has no administration in time range)  ketorolac (TORADOL) 30 MG/ML injection 30 mg (30 mg Intravenous Given 12/13/18 2217)  methocarbamol (ROBAXIN) tablet 500 mg (500 mg Oral Given 12/13/18 2217)  oxyCODONE-acetaminophen (PERCOCET/ROXICET) 5-325 MG per tablet 1 tablet (1 tablet Oral Given 12/13/18 2217)     Initial Impression / Assessment and Plan / ED Course  I have reviewed the triage vital signs and the nursing notes.  Pertinent labs & imaging results that were available during my care of the patient were reviewed by me and considered in my medical decision making (see chart for details).       Sabrina Harding is a 38 y.o. female who presents to ED for left-sided low back pain which radiates to the left flank. On exam, patient is febrile, hemodynamically stable with no midline spinal tenderness and benign abdominal exam.  She does have tenderness to the left paraspinal musculature as well as the left flank.  No overlying skin changes to the flank. LE's are NVI.  No red symptoms of back pain.  Labs reviewed and reassuring.  Normal white count.  Stable, baseline anemia.  Normal creatinine. UA c/w infection. She has had several CT's in the past when presenting with similar complaints. No hx of kidney stones and no stones seen on previous  scans. Do not feel imaging is needed at this time. Will treat with keflex and have her follow up with PCP. Discussed reasons to return to ER at length. All questions answered.   Final Clinical Impressions(s) / ED Diagnoses   Final diagnoses:  Pyelonephritis    ED Discharge Orders         Ordered    cephALEXin (KEFLEX) 500 MG capsule  4 times daily     12/13/18 2355    HYDROcodone-acetaminophen (NORCO) 5-325 MG tablet  Every 6 hours PRN     12/13/18 2355           , Ozella Almond, PA-C 12/14/18 0003    Drenda Freeze, MD 12/16/18 [phone]

## 2018-12-13 NOTE — Discharge Instructions (Addendum)
It was my pleasure taking care of you today!   Stay very well hydrated with plenty of water throughout the day. Please take antibiotic until completion.   Follow up with primary care physician in 1 week for recheck of ongoing symptoms. If you do not have one, please call the clinic listed.   Please seek immediate care if you develop the following: Your symptoms are no better or worse in 3 days. You develop a fever There is nausea or vomiting.  There is continued symptoms after antibiotics are complete New or worsening symptoms develop or you have any additional concerns.

## 2018-12-13 NOTE — ED Notes (Addendum)
Pt presents with Left lower back pain radiating to her Left flank area starting today. Pt denies any vaginal odor/discharge or problems urinating.

## 2018-12-15 LAB — URINE CULTURE: Culture: >=100000 — AB

## 2018-12-16 ENCOUNTER — Telehealth: Payer: Self-pay | Admitting: Emergency Medicine

## 2018-12-16 NOTE — Telephone Encounter (Signed)
Post ED Visit - Positive Culture Follow-up  Culture report reviewed by antimicrobial stewardship pharmacist: [facility] Team []  Elenor Quinones, Pharm.D. []  Heide Guile, Pharm.D., BCPS AQ-ID []  Parks Neptune, Pharm.D., BCPS []  Alycia Rossetti, Pharm.D., BCPS []  [place], Pharm.D., BCPS, AAHIVP []  Legrand Como, Pharm.D., BCPS, AAHIVP []  Salome Arnt, PharmD, BCPS []  Johnnette Gourd, PharmD, BCPS []  Hughes Better, PharmD, BCPS []  Leeroy Cha, PharmD []  Laqueta Linden, PharmD, BCPS []  Albertina Parr, PharmD Agnes Lawrence PharmD  [facility] Team []  Leodis Sias, PharmD []  Lindell Spar, PharmD []  Royetta Asal, PharmD []  Graylin Shiver, Rph []  Rema Fendt) Glennon Mac, PharmD []  Arlyn Dunning, PharmD []  Netta Cedars, PharmD []  Dia Sitter, PharmD []  Leone Haven, PharmD []  Gretta Arab, PharmD []  Theodis Shove, PharmD []  Peggyann Juba, PharmD []  Reuel Boom, PharmD   Positive urine culture Treated with cephalexin, organism sensitive to the same and no further patient follow-up is required at this time.  Hazle Nordmann 12/16/2018, 1:23 PM

## 2018-12-18 ENCOUNTER — Other Ambulatory Visit: Payer: Self-pay

## 2018-12-18 ENCOUNTER — Emergency Department (HOSPITAL_COMMUNITY)
Admission: EM | Admit: 2018-12-18 | Discharge: 2018-12-19 | Disposition: A | Discharge status: Home / Self Care | Payer: Self-pay | Attending: Emergency Medicine | Admitting: Emergency Medicine

## 2018-12-18 DIAGNOSIS — M545 Low back pain: Secondary | ICD-10-CM | POA: Insufficient documentation

## 2018-12-18 DIAGNOSIS — R109 Unspecified abdominal pain: Secondary | ICD-10-CM | POA: Insufficient documentation

## 2018-12-18 DIAGNOSIS — F17210 Nicotine dependence, cigarettes, uncomplicated: Secondary | ICD-10-CM | POA: Insufficient documentation

## 2018-12-18 LAB — I-STAT BETA HCG BLOOD, ED (MC, WL, AP ONLY): I-stat hCG, quantitative: <5 m[IU]/mL (ref <5)

## 2018-12-18 LAB — CBC
HCT: 42.2 % (ref 36.0–46.0)
Hemoglobin: 13.3 g/dL (ref 12.0–15.0)
MCH: 24.9 pg — ABNORMAL LOW (ref 26.0–34.0)
MCHC: 31.5 g/dL (ref 30.0–36.0)
MCV: 78.9 fL — ABNORMAL LOW (ref 80.0–100.0)
Platelets: 383 10*3/uL (ref 150–400)
RBC: 5.35 MIL/uL — ABNORMAL HIGH (ref 3.87–5.11)
RDW: 15.7 % — ABNORMAL HIGH (ref 11.5–15.5)
WBC: 8 10*3/uL (ref 4.0–10.5)
nRBC: 0 % (ref 0.0–0.2)

## 2018-12-18 LAB — URINALYSIS, ROUTINE W REFLEX MICROSCOPIC
Bilirubin Urine: NEGATIVE
Glucose, UA: NEGATIVE mg/dL
Hgb urine dipstick: NEGATIVE
Ketones, ur: NEGATIVE mg/dL
Nitrite: NEGATIVE
Protein, ur: NEGATIVE mg/dL
Specific Gravity, Urine: 1.017 (ref 1.005–1.030)
WBC, UA: >50 WBC/hpf — ABNORMAL HIGH (ref 0–5)
pH: 5 (ref 5.0–8.0)

## 2018-12-18 LAB — BASIC METABOLIC PANEL
Anion gap: 10 (ref 5–15)
BUN: 12 mg/dL (ref 6–20)
CO2: 25 mmol/L (ref 22–32)
Calcium: 9.5 mg/dL (ref 8.9–10.3)
Chloride: 102 mmol/L (ref 98–111)
Creatinine, Ser: 0.96 mg/dL (ref 0.44–1.00)
GFR calc Af Amer: >60 mL/min (ref >60)
GFR calc non Af Amer: >60 mL/min (ref >60)
Glucose, Bld: 99 mg/dL (ref 70–99)
Potassium: 4.2 mmol/L (ref 3.5–5.1)
Sodium: 137 mmol/L (ref 135–145)

## 2018-12-18 NOTE — ED Triage Notes (Addendum)
Pt c/o left flank pain, currently on abx. Pt was seen here Sunday evening for same sx's, feeling worse.

## 2018-12-19 ENCOUNTER — Encounter (HOSPITAL_COMMUNITY): Payer: Self-pay | Admitting: Student

## 2018-12-19 MED ORDER — ONDANSETRON HCL 4 MG/2ML IJ SOLN
4.0000 mg | Freq: Once | INTRAMUSCULAR | Status: AC
  Administered 2018-12-19: 4 mg via INTRAVENOUS
  Filled 2018-12-19: qty 2

## 2018-12-19 MED ORDER — ONDANSETRON 4 MG PO TBDP: 4.0000 mg | ORAL_TABLET | Freq: Three times a day (TID) | ORAL | 0 refills | Status: DC | PRN

## 2018-12-19 MED ORDER — KETOROLAC TROMETHAMINE 15 MG/ML IJ SOLN
15.0000 mg | Freq: Once | INTRAMUSCULAR | Status: AC
  Administered 2018-12-19: 15 mg via INTRAVENOUS
  Filled 2018-12-19: qty 1

## 2018-12-19 MED ORDER — OXYCODONE-ACETAMINOPHEN 5-325 MG PO TABS
1.0000 | ORAL_TABLET | Freq: Once | ORAL | Status: AC
  Administered 2018-12-19: 1 via ORAL
  Filled 2018-12-19: qty 1

## 2018-12-19 MED ORDER — OXYCODONE-ACETAMINOPHEN 5-325 MG PO TABS: 1.0000 | ORAL_TABLET | Freq: Four times a day (QID) | ORAL | 0 refills | Status: DC | PRN

## 2018-12-19 MED ORDER — NAPROXEN 500 MG PO TABS: 500.0000 mg | ORAL_TABLET | Freq: Two times a day (BID) | ORAL | 0 refills | Status: DC

## 2018-12-19 MED ORDER — METHOCARBAMOL 1000 MG/10ML IJ SOLN
1000.0000 mg | Freq: Once | INTRAVENOUS | Status: AC
  Administered 2018-12-19: 1000 mg via INTRAVENOUS
  Filled 2018-12-19: qty 10

## 2018-12-19 MED ORDER — METHOCARBAMOL 1000 MG/10ML IJ SOLN: 1000.0000 mg | Freq: Once | INTRAMUSCULAR | Status: DC

## 2018-12-19 MED ORDER — SODIUM CHLORIDE 0.9 % IV BOLUS
1000.0000 mL | Freq: Once | INTRAVENOUS | Status: AC
  Administered 2018-12-19: 1000 mL via INTRAVENOUS

## 2018-12-19 MED ORDER — LIDOCAINE 5 % EX PTCH
1.0000 | MEDICATED_PATCH | CUTANEOUS | Status: DC
  Administered 2018-12-19: 1 via TRANSDERMAL
  Filled 2018-12-19: qty 1

## 2018-12-19 NOTE — ED Notes (Signed)
Pt currently eating plate brought to her and drinking water without difficulty

## 2018-12-19 NOTE — ED Provider Notes (Signed)
[facility] EMERGENCY DEPARTMENT Provider Note   CSN: [id number] Arrival date & time: 12/18/18  2059     History   Chief Complaint Chief Complaint  Patient presents with  . Flank Pain    Left    HPI Tyana N Kauffmann is a 38 y.o. female with a hx of tobacco abuse, anemia, & prior PID who returns to the ER with complaints of continued left-sided back pain which has been ongoing for about a week.  Patient states the pain is to her left flank/lower back, it is constant, it feels like spasm/contractions, it is worse with position changes, standing, and movement.  No alleviating factors.  She has had associated urinary frequency as well as nausea with a few episodes of emesis per day.  N/V started over the past 3 to 4 days.  She was seen in the emergency department for her pain 12/10/18-there is concern for pyelonephritis, she was placed on Keflex 500 mg 4 times daily and given a short course of Norco.  She states the Norco has not helped the pain.  She has been taking the Keflex as prescribed.  She states she is concerned that she might be vomiting some of that up.  Denies fever, chills, hematemesis, abdominal pain, vaginal bleeding, or vaginal discharge.  She is not sexually active, she is not concerned for STDs.  Denies incontinence, IVDU, history of cancer, numbness, or focal weakness.     HPI  Past Medical History:  Diagnosis Date  . Anemia   . Gall stones   . Gonorrhea   . Headache(784.0)   . Heart murmur    childhood  . Trichomonas contact     Patient Active Problem List   Diagnosis Date Noted  . Oral herpes 01/10/2016  . Pelvic inflammatory disease (PID) 01/07/2016  . Gonorrhea in female 01/07/2016  . Pyelonephritis 01/06/2016    Past Surgical History:  Procedure Laterality Date  . CHOLECYSTECTOMY  10/17/2011   Procedure: LAPAROSCOPIC CHOLECYSTECTOMY;  Surgeon: Harl Bowie, MD;  Location: [facility];  Service: General;  Laterality: N/A;  .  TONSILLECTOMY    . TUBAL LIGATION       OB History    Gravida  6   Para  0   Term  0   Preterm      AB  3   Living  3     SAB  3   TAB      Ectopic      Multiple      Live Births  3            Home Medications    Prior to Admission medications   Medication Sig Start Date End Date Taking? Authorizing Provider  Aspirin-Salicylamide-Caffeine (BC HEADACHE POWDER PO) Take 1 packet by mouth as needed (pain/headache).     [provider]  cephALEXin (KEFLEX) 500 MG capsule Take 1 capsule (500 mg total) by mouth 4 (four) times daily. 12/13/18   Ward, Ozella Almond, PA-C  HYDROcodone-acetaminophen (NORCO) 5-325 MG tablet Take 1 tablet by mouth every 6 (six) hours as needed for moderate pain or severe pain. 12/13/18   Ward, Ozella Almond, PA-C  naproxen (NAPROSYN) 500 MG tablet Take 1 tablet (500 mg total) by mouth 2 (two) times daily. Patient not taking: Reported on 12/13/2018 07/24/18   Arville Lime, PA-C    Family History Family History  Problem Relation Age of Onset  . Hypertension Father     Social History  Social History   Tobacco Use  . Smoking status: Current Every Day Smoker    Packs/day: 1.00    Years: 8.00    Pack years: 8.00    Types: Cigarettes  . Smokeless tobacco: Never Used  Substance Use Topics  . Alcohol use: Yes    Alcohol/week: 0.0 standard drinks    Comment: occasional  . Drug use: No     Allergies   Patient has no known allergies.   Review of Systems Review of Systems  Constitutional: Negative for chills and fever.  Respiratory: Negative for shortness of breath.   Cardiovascular: Negative for chest pain.  Gastrointestinal: Positive for nausea and vomiting. Negative for abdominal pain, blood in stool, constipation and diarrhea.  Genitourinary: Positive for flank pain and frequency. Negative for dysuria, vaginal bleeding and vaginal discharge.  Musculoskeletal: Positive for back pain.  Neurological: Negative for weakness  and numbness.       Negative for incontinence.  All other systems reviewed and are negative.    Physical Exam Updated Vital Signs BP 126/87 (BP Location: Left Arm)   Pulse 83   Temp 98.4 F (36.9 C) (Oral)   Resp 18   LMP 09/01/2012 Comment: Pt states she has not had a period since she was "22-something"  SpO2 96%   Physical Exam Vitals signs and nursing note reviewed.  Constitutional:      General: She is not in acute distress.    Appearance: She is well-developed. She is not toxic-appearing.  HENT:     Head: Normocephalic and atraumatic.  Eyes:     General:        Right eye: No discharge.        Left eye: No discharge.     Conjunctiva/sclera: Conjunctivae normal.  Neck:     Musculoskeletal: Normal range of motion and neck supple. No spinous process tenderness or muscular tenderness.  Cardiovascular:     Rate and Rhythm: Normal rate and regular rhythm.  Pulmonary:     Effort: Pulmonary effort is normal. No respiratory distress.     Breath sounds: Normal breath sounds. No wheezing, rhonchi or rales.  Abdominal:     General: There is no distension.     Palpations: Abdomen is soft.     Tenderness: There is no abdominal tenderness. There is left CVA tenderness. There is no right CVA tenderness, guarding or rebound.  Musculoskeletal:     Comments: No obvious deformity, appreciable swelling, erythema, ecchymosis, significant open wounds, or increased warmth.  Extremities: Normal ROM. Nontender.  Back: No point/focal vertebral tenderness, no palpable step off or crepitus.  Tender to palpation to the left lower thoracic and lumbar paraspinal muscles.  Skin:    General: Skin is warm and dry.     Findings: No rash.  Neurological:     Mental Status: She is alert.     Comments: Sensation grossly intact to bilateral lower extremities. 5/5 symmetric strength with plantar/dorsiflexion bilaterally.   Psychiatric:        Behavior: Behavior normal.    ED Treatments / Results   Labs (all labs ordered are listed, but only abnormal results are displayed) Labs Reviewed  URINALYSIS, ROUTINE W REFLEX MICROSCOPIC - Abnormal; Notable for the following components:      Result Value   APPearance CLOUDY (*)    Leukocytes,Ua LARGE (*)    WBC, UA >50 (*)    Bacteria, UA RARE (*)    All other components within normal limits  CBC - Abnormal;  Notable for the following components:   RBC 5.35 (*)    MCV 78.9 (*)    MCH 24.9 (*)    RDW 15.7 (*)    All other components within normal limits  URINE CULTURE  BASIC METABOLIC PANEL  I-STAT BETA HCG BLOOD, ED ([facility], [facility], [facility] ONLY)    EKG None  Radiology No results found.  Procedures Procedures (including critical care time)  Medications Ordered in ED Medications  lidocaine (LIDODERM) 5 % 1 patch (1 patch Transdermal Patch Applied 12/19/18 0614)  sodium chloride 0.9 % bolus 1,000 mL (0 mLs Intravenous Stopped 12/19/18 0728)  ondansetron (ZOFRAN) injection 4 mg (4 mg Intravenous Given 12/19/18 0614)  ketorolac (TORADOL) 15 MG/ML injection 15 mg (15 mg Intravenous Given 12/19/18 0614)  methocarbamol (ROBAXIN) 1,000 mg in dextrose 5 % 50 mL IVPB (0 mg Intravenous Stopped 12/19/18 0700)  oxyCODONE-acetaminophen (PERCOCET/ROXICET) 5-325 MG per tablet 1 tablet (1 tablet Oral Given 12/19/18 0733)     Initial Impression / Assessment and Plan / ED Course  I have reviewed the triage vital signs and the nursing notes.  Pertinent labs & imaging results that were available during my care of the patient were reviewed by me and considered in my medical decision making (see chart for details).   Patient returns to the emergency department with continued left flank/lower back pain, is also had some nausea/vomiting and urinary symptoms.  Work-up per triage team has been reviewed: CBC: No leukocytosis or significant anemia BMP: No electrolyte derangement.  Renal function preserved Urinalysis: Large leukocytes, greater than 50 WBCs Pregnancy  test: Negative  Patient had recent ER visits 09/06-diagnosed with pyelonephritis, placed on Keflex 500 mg 4 times daily and given short course of Norco tablets.  Urine culture reviewed, grew out greater than 100,000 colonies per mL of E. coli, sensitive to cephalosporins.  In the emergency department today she does not appear toxic, she does not appear septic, she has no leukocytosis, fever, or tachycardia.  She has been eating and tolerating p.o. in the lobby per nursing staff documentation.  Suspect her back/flank pain is multifactorial pyelonephritis and likely muscular component.  She was given IV fluids, analgesics, and antiemetics in the emergency department with some improvement.  Feel she is appropriate for discharge home given she is not septic appearing & is able to tolerate PO..  We will have her continue Keflex, provide short course of Percocet as well as NSAID, and Zofran.  Strict return precautions.  Urine culture sent from today's urinalysis: I discussed results, treatment plan, need for follow-up, and return precautions with the patient. Provided opportunity for questions, patient confirmed understanding and is in agreement with plan.     Findings and plan of care discussed with supervising physician Dr. Dayna Barker who is in agreement.   Final Clinical Impressions(s) / ED Diagnoses   Final diagnoses:  Flank pain    ED Discharge Orders         Ordered    naproxen (NAPROSYN) 500 MG tablet  2 times daily     12/19/18 0725    oxyCODONE-acetaminophen (PERCOCET/ROXICET) 5-325 MG tablet  Every 6 hours PRN     12/19/18 0725    ondansetron (ZOFRAN ODT) 4 MG disintegrating tablet  Every 8 hours PRN     12/19/18 0725           Amaryllis Dyke, PA-C 12/19/18 0734    Mesner, Corene Cornea, MD 12/20/18 [phone]

## 2018-12-19 NOTE — Discharge Instructions (Addendum)
You were seen in the emergency department today for backslash flank pain.  Your work-up was overall reassuring.  Your labs not show substantial abnormalities.  Your urine still has some white blood cells in it, we have sent this for culture.  Please continue the Keflex you are prescribed.  We are sending you home with the following medicines to help with your symptoms: - - Naproxen is a nonsteroidal anti-inflammatory medication that will help with pain and swelling. Be sure to take this medication as prescribed with food, 1 pill every 12 hours,  It should be taken with food, as it can cause stomach upset, and more seriously, stomach bleeding. Do not take other nonsteroidal anti-inflammatory medications with this such as Advil, Motrin, Aleve, Mobic, Goodie Powder, or Motrin.    - Percocet-this is a narcotic/controlled substance medication that has potential addicting qualities.  We recommend that you take 1-2 tablets every 6 hours as needed for severe pain.  Do not drive or operate heavy machinery when taking this medicine as it can be sedating. Do not drink alcohol or take other sedating medications when taking this medicine for safety reasons.  Keep this out of reach of small children.  Please be aware this medicine has Tylenol in it (325 mg/tab) do not exceed the maximum dose of Tylenol in a day per over the counter recommendations should you decide to supplement with Tylenol over the counter.   - Zofran-take this for nausea/vomiting as needed every 8 hours.  We have prescribed you new medication(s) today. Discuss the medications prescribed today with your pharmacist as they can have adverse effects and interactions with your other medicines including over the counter and prescribed medications. Seek medical evaluation if you start to experience new or abnormal symptoms after taking one of these medicines, seek care immediately if you start to experience difficulty breathing, feeling of your throat closing,  facial swelling, or rash as these could be indications of a more serious allergic reaction  Please apply heat to your lower back to help relieve muscular tension.  Please follow-up with primary care within 3 days.  Return to the ER for new or worsening symptoms including but not limited to fever, inability to keep fluids down, worsening pain, or any other concerns.

## 2018-12-20 LAB — URINE CULTURE

## 2019-09-27 ENCOUNTER — Encounter (HOSPITAL_COMMUNITY): Payer: Self-pay | Admitting: *Deleted

## 2019-09-27 ENCOUNTER — Other Ambulatory Visit: Payer: Self-pay

## 2019-09-27 DIAGNOSIS — F17210 Nicotine dependence, cigarettes, uncomplicated: Secondary | ICD-10-CM | POA: Insufficient documentation

## 2019-09-27 DIAGNOSIS — R112 Nausea with vomiting, unspecified: Secondary | ICD-10-CM | POA: Insufficient documentation

## 2019-09-27 DIAGNOSIS — R519 Headache, unspecified: Secondary | ICD-10-CM | POA: Insufficient documentation

## 2019-09-27 DIAGNOSIS — N390 Urinary tract infection, site not specified: Secondary | ICD-10-CM | POA: Insufficient documentation

## 2019-09-27 DIAGNOSIS — Z7982 Long term (current) use of aspirin: Secondary | ICD-10-CM | POA: Insufficient documentation

## 2019-09-27 DIAGNOSIS — R319 Hematuria, unspecified: Secondary | ICD-10-CM | POA: Insufficient documentation

## 2019-09-27 DIAGNOSIS — R030 Elevated blood-pressure reading, without diagnosis of hypertension: Secondary | ICD-10-CM | POA: Insufficient documentation

## 2019-09-27 LAB — COMPREHENSIVE METABOLIC PANEL
ALT: 16 U/L (ref 0–44)
AST: 14 U/L — ABNORMAL LOW (ref 15–41)
Albumin: 3.7 g/dL (ref 3.5–5.0)
Alkaline Phosphatase: 79 U/L (ref 38–126)
Anion gap: 8 (ref 5–15)
BUN: 10 mg/dL (ref 6–20)
CO2: 30 mmol/L (ref 22–32)
Calcium: 9.1 mg/dL (ref 8.9–10.3)
Chloride: 103 mmol/L (ref 98–111)
Creatinine, Ser: 0.83 mg/dL (ref 0.44–1.00)
GFR calc Af Amer: >60 mL/min (ref >60)
GFR calc non Af Amer: >60 mL/min (ref >60)
Glucose, Bld: 102 mg/dL — ABNORMAL HIGH (ref 70–99)
Potassium: 4.3 mmol/L (ref 3.5–5.1)
Sodium: 141 mmol/L (ref 135–145)
Total Bilirubin: 0.5 mg/dL (ref 0.3–1.2)
Total Protein: 7.5 g/dL (ref 6.5–8.1)

## 2019-09-27 LAB — CBC
HCT: 40 % (ref 36.0–46.0)
Hemoglobin: 12.6 g/dL (ref 12.0–15.0)
MCH: 24.9 pg — ABNORMAL LOW (ref 26.0–34.0)
MCHC: 31.5 g/dL (ref 30.0–36.0)
MCV: 78.9 fL — ABNORMAL LOW (ref 80.0–100.0)
Platelets: 351 10*3/uL (ref 150–400)
RBC: 5.07 MIL/uL (ref 3.87–5.11)
RDW: 15.9 % — ABNORMAL HIGH (ref 11.5–15.5)
WBC: 8.8 10*3/uL (ref 4.0–10.5)
nRBC: 0 % (ref 0.0–0.2)

## 2019-09-27 LAB — I-STAT BETA HCG BLOOD, ED (MC, WL, AP ONLY): I-stat hCG, quantitative: <5 m[IU]/mL (ref <5)

## 2019-09-27 LAB — LIPASE, BLOOD: Lipase: 18 U/L (ref 11–51)

## 2019-09-27 MED ORDER — SODIUM CHLORIDE 0.9% FLUSH
3.0000 mL | Freq: Once | INTRAVENOUS | Status: AC
  Administered 2019-09-28: 3 mL via INTRAVENOUS

## 2019-09-27 NOTE — ED Triage Notes (Signed)
/  since Thursday or Friday, Headache, some N/V, left flank pain.

## 2019-09-28 ENCOUNTER — Emergency Department (HOSPITAL_COMMUNITY)
Admission: EM | Admit: 2019-09-28 | Discharge: 2019-09-28 | Disposition: A | Discharge status: Home / Self Care | Payer: Self-pay | Attending: Emergency Medicine | Admitting: Emergency Medicine

## 2019-09-28 DIAGNOSIS — R030 Elevated blood-pressure reading, without diagnosis of hypertension: Secondary | ICD-10-CM

## 2019-09-28 DIAGNOSIS — R319 Hematuria, unspecified: Secondary | ICD-10-CM

## 2019-09-28 DIAGNOSIS — R519 Headache, unspecified: Secondary | ICD-10-CM

## 2019-09-28 DIAGNOSIS — R112 Nausea with vomiting, unspecified: Secondary | ICD-10-CM

## 2019-09-28 LAB — URINALYSIS, ROUTINE W REFLEX MICROSCOPIC
Bilirubin Urine: NEGATIVE
Glucose, UA: NEGATIVE mg/dL
Ketones, ur: NEGATIVE mg/dL
Nitrite: POSITIVE — AB
Protein, ur: NEGATIVE mg/dL
Specific Gravity, Urine: 1.012 (ref 1.005–1.030)
pH: 6 (ref 5.0–8.0)

## 2019-09-28 MED ORDER — DEXAMETHASONE SODIUM PHOSPHATE 10 MG/ML IJ SOLN
10.0000 mg | Freq: Once | INTRAMUSCULAR | Status: AC
  Administered 2019-09-28: 10 mg via INTRAVENOUS
  Filled 2019-09-28: qty 1

## 2019-09-28 MED ORDER — PROCHLORPERAZINE EDISYLATE 10 MG/2ML IJ SOLN
10.0000 mg | Freq: Once | INTRAMUSCULAR | Status: AC
  Administered 2019-09-28: 10 mg via INTRAVENOUS
  Filled 2019-09-28: qty 2

## 2019-09-28 MED ORDER — DIPHENHYDRAMINE HCL 50 MG/ML IJ SOLN
25.0000 mg | Freq: Once | INTRAMUSCULAR | Status: AC
  Administered 2019-09-28: 25 mg via INTRAVENOUS
  Filled 2019-09-28: qty 1

## 2019-09-28 MED ORDER — PROCHLORPERAZINE MALEATE 10 MG PO TABS
10.0000 mg | ORAL_TABLET | Freq: Four times a day (QID) | ORAL | 0 refills | Status: DC | PRN
Start: 2019-09-28 — End: 2020-09-21

## 2019-09-28 MED ORDER — CEPHALEXIN 500 MG PO CAPS
500.0000 mg | ORAL_CAPSULE | Freq: Three times a day (TID) | ORAL | 0 refills | Status: DC
Start: 2019-09-28 — End: 2020-09-21

## 2019-09-28 MED ORDER — SODIUM CHLORIDE 0.9 % IV BOLUS
1000.0000 mL | Freq: Once | INTRAVENOUS | Status: AC
  Administered 2019-09-28: 1000 mL via INTRAVENOUS

## 2019-09-28 MED ORDER — CEPHALEXIN 500 MG PO CAPS
500.0000 mg | ORAL_CAPSULE | Freq: Once | ORAL | Status: AC
  Administered 2019-09-28: 500 mg via ORAL
  Filled 2019-09-28: qty 1

## 2019-09-28 NOTE — Discharge Instructions (Addendum)
Drink plenty of fluids.  Take ibuprofen and/or acetaminophen as needed for pain.  Return if you are having any problems.  Please make sure to get your second dose of the COVID vaccine.

## 2019-09-28 NOTE — ED Provider Notes (Signed)
[facility] DEPT Provider Note   CSN: [id number] Arrival date & time: 09/27/19  1526   History Chief Complaint  Patient presents with  . Headache  . Emesis  . Nausea  . Flank Pain    Sabrina Harding is a 39 y.o. female.  The history is provided by the patient.  Headache Associated symptoms: vomiting   Emesis Associated symptoms: headaches   Flank Pain Associated symptoms include headaches.  She complains of being sick for the last 3-4 days.  She started off with feeling lightheaded and this was followed by nausea and vomiting.  She has had intermittent emesis over the last 3 days.  She continues to feel lightheaded.  She has not run a fever nor had any chills or sweats.  She denies constipation or diarrhea.  She denies change in sense of smell or taste.  She denies any sick contacts and specifically denies exposure to COVID-19.  She is also complaining of some mild in the left flank.  She tried taking BC powder and ibuprofen without relief.  Headache pain is described as throbbing and she currently rates it at 8/10.  There is some associated photophobia and phonophobia.  Of note, she has received 1 dose of the coronavirus vaccine.  Past Medical History:  Diagnosis Date  . Anemia   . Gall stones   . Gonorrhea   . Headache(784.0)   . Heart murmur    childhood  . Trichomonas contact     Patient Active Problem List   Diagnosis Date Noted  . Oral herpes 01/10/2016  . Pelvic inflammatory disease (PID) 01/07/2016  . Gonorrhea in female 01/07/2016  . Pyelonephritis 01/06/2016    Past Surgical History:  Procedure Laterality Date  . CHOLECYSTECTOMY  10/17/2011   Procedure: LAPAROSCOPIC CHOLECYSTECTOMY;  Surgeon: Harl Bowie, MD;  Location: [facility];  Service: General;  Laterality: N/A;  . TONSILLECTOMY    . TUBAL LIGATION       OB History    Gravida  6   Para  0   Term  0   Preterm      AB  3   Living  3     SAB  3    TAB      Ectopic      Multiple      Live Births  3           Family History  Problem Relation Age of Onset  . Hypertension Father     Social History   Tobacco Use  . Smoking status: Current Every Day Smoker    Packs/day: 1.00    Years: 8.00    Pack years: 8.00    Types: Cigarettes  . Smokeless tobacco: Never Used  Vaping Use  . Vaping Use: Never used  Substance Use Topics  . Alcohol use: Yes    Alcohol/week: 0.0 standard drinks    Comment: occasional  . Drug use: No    Home Medications Prior to Admission medications   Medication Sig Start Date End Date Taking? Authorizing Provider  Aspirin-Salicylamide-Caffeine (BC HEADACHE POWDER PO) Take 1 packet by mouth as needed (pain/headache).     [provider]  cephALEXin (KEFLEX) 500 MG capsule Take 1 capsule (500 mg total) by mouth 4 (four) times daily. 12/13/18   Ward, Ozella Almond, PA-C  HYDROcodone-acetaminophen (NORCO) 5-325 MG tablet Take 1 tablet by mouth every 6 (six) hours as needed for moderate pain or severe pain. 12/13/18  Ward, Ozella Almond, PA-C  naproxen (NAPROSYN) 500 MG tablet Take 1 tablet (500 mg total) by mouth 2 (two) times daily. 12/19/18   Petrucelli, Samantha R, PA-C  ondansetron (ZOFRAN ODT) 4 MG disintegrating tablet Take 1 tablet (4 mg total) by mouth every 8 (eight) hours as needed for nausea or vomiting. 12/19/18   Petrucelli, Samantha R, PA-C  oxyCODONE-acetaminophen (PERCOCET/ROXICET) 5-325 MG tablet Take 1-2 tablets by mouth every 6 (six) hours as needed for severe pain. 12/19/18   Petrucelli, Glynda Jaeger, PA-C    Allergies    Patient has no known allergies.  Review of Systems   Review of Systems  Gastrointestinal: Positive for vomiting.  Genitourinary: Positive for flank pain.  Neurological: Positive for headaches.  All other systems reviewed and are negative.   Physical Exam Updated Vital Signs BP (!) 143/50 (BP Location: Left Arm)   Pulse 80   Temp 98.6 F (37 C) (Oral)    Resp 18   Ht 5\' 5"  (1.651 m)   Wt 120.5 kg   LMP 09/01/2012 Comment: Pt states she has not had a period since she was "22-something"  SpO2 98%   BMI 44.20 kg/m   Physical Exam Vitals and nursing note reviewed.   39 year old female, resting comfortably and in no acute distress. Vital signs are significant for elevated blood pressure. Oxygen saturation is 98%, which is normal. Head is normocephalic and atraumatic. PERRLA, EOMI. Oropharynx is clear.  There is tenderness to palpation over the temporalis muscles bilaterally.  There is no tenderness palpation over the insertion of the paracervical muscles. Neck is nontender and supple without adenopathy or JVD. Back is nontender and there is no CVA tenderness. Lungs are clear without rales, wheezes, or rhonchi. Chest is nontender. Heart has regular rate and rhythm without murmur. Abdomen is soft, flat, nontender without masses or hepatosplenomegaly and peristalsis is hypoactive. Extremities have no cyanosis or edema, full range of motion is present. Skin is warm and dry without rash. Neurologic: Mental status is normal, cranial nerves are intact, there are no motor or sensory deficits.  ED Results / Procedures / Treatments   Labs (all labs ordered are listed, but only abnormal results are displayed) Labs Reviewed  COMPREHENSIVE METABOLIC PANEL - Abnormal; Notable for the following components:      Result Value   Glucose, Bld 102 (*)    AST 14 (*)    All other components within normal limits  CBC - Abnormal; Notable for the following components:   MCV 78.9 (*)    MCH 24.9 (*)    RDW 15.9 (*)    All other components within normal limits  URINALYSIS, ROUTINE W REFLEX MICROSCOPIC - Abnormal; Notable for the following components:   APPearance CLOUDY (*)    Hgb urine dipstick SMALL (*)    Nitrite POSITIVE (*)    Leukocytes,Ua LARGE (*)    Bacteria, UA MANY (*)    All other components within normal limits  URINE CULTURE  LIPASE,  BLOOD  I-STAT BETA HCG BLOOD, ED ([facility], [facility], [facility] ONLY)   Procedures Procedures  Medications Ordered in ED Medications  cephALEXin (KEFLEX) capsule 500 mg (has no administration in time range)  sodium chloride flush (NS) 0.9 % injection 3 mL (3 mLs Intravenous Given 09/28/19 0324)  sodium chloride 0.9 % bolus 1,000 mL (1,000 mLs Intravenous New Bag/Given 09/28/19 0318)  prochlorperazine (COMPAZINE) injection 10 mg (10 mg Intravenous Given 09/28/19 0319)  diphenhydrAMINE (BENADRYL) injection 25 mg (25 mg Intravenous  Given 09/28/19 0321)  dexamethasone (DECADRON) injection 10 mg (10 mg Intravenous Given 09/28/19 0324)    ED Course  I have reviewed the triage vital signs and the nursing notes.  Pertinent lab results that were available during my care of the patient were reviewed by me and considered in my medical decision making (see chart for details).  MDM Rules/Calculators/A&P Headache with pattern most suggestive of a muscle contraction headache, possible migraine variant.  No red flags to suggest serious pathology such as subarachnoid hemorrhage, meningitis.  Nausea and vomiting possibly related to her headache, possible viral gastritis.  Labs were obtained at triage and are reassuring.  Urinalysis is still pending.  Old records are reviewed, and she has several ED visits for pyelonephritis.  We will give headache cocktail of normal saline, prochlorperazine, diphenhydramine, dexamethasone. Will need to check urinalysis.  She feels much better after above-noted treatment.  Urinalysis suggest UTI with positive nitrite, 21-50 WBCs and many bacteria although it is a contaminated specimen with 11-20 squamous epithelial cells per low powered field.  She is given a dose of cephalexin and discharged with prescriptions for cephalexin and prochlorperazine.  Return precautions discussed.  Final Clinical Impression(s) / ED Diagnoses Final diagnoses:  Bad headache  Non-intractable vomiting with nausea,  unspecified vomiting type  Urinary tract infection with hematuria, site unspecified  Elevated blood pressure reading without diagnosis of hypertension    Rx / DC Orders ED Discharge Orders         Ordered    cephALEXin (KEFLEX) 500 MG capsule  3 times daily     Discontinue  Reprint     09/28/19 0435    prochlorperazine (COMPAZINE) 10 MG tablet  Every 6 hours PRN     Discontinue  Reprint     09/28/19 1700           Delora Fuel, MD 17[birth year]44 [phone]

## 2019-09-30 LAB — URINE CULTURE: Culture: >=100000 — AB

## 2019-10-01 ENCOUNTER — Telehealth: Payer: Self-pay | Admitting: Emergency Medicine

## 2019-10-01 NOTE — Telephone Encounter (Signed)
Post ED Visit - Positive Culture Follow-up  Culture report reviewed by antimicrobial stewardship pharmacist: [facility] Team []  Elenor Quinones, Pharm.D. []  Heide Guile, Pharm.D., BCPS AQ-ID []  Parks Neptune, Pharm.D., BCPS []  Alycia Rossetti, Pharm.D., BCPS []  [place], Pharm.D., BCPS, AAHIVP []  Legrand Como, Pharm.D., BCPS, AAHIVP []  Salome Arnt, PharmD, BCPS []  Johnnette Gourd, PharmD, BCPS []  Hughes Better, PharmD, BCPS []  Leeroy Cha, PharmD []  Laqueta Linden, PharmD, BCPS []  Albertina Parr, PharmD  [facility] Team []  Leodis Sias, PharmD []  Lindell Spar, PharmD []  Royetta Asal, PharmD []  Graylin Shiver, Rph []  Rema Fendt) Glennon Mac, PharmD []  Arlyn Dunning, PharmD []  Netta Cedars, PharmD [x]  Dia Sitter, PharmD []  Leone Haven, PharmD []  Gretta Arab, PharmD []  Theodis Shove, PharmD []  Peggyann Juba, PharmD []  Reuel Boom, PharmD   Positive urine culture Treated with cephalexin, organism sensitive to the same and no further patient follow-up is required at this time.  Hazle Nordmann 10/01/2019, 12:01 PM

## 2019-11-21 ENCOUNTER — Emergency Department (HOSPITAL_COMMUNITY)
Admission: EM | Admit: 2019-11-21 | Discharge: 2019-11-21 | Disposition: A | Discharge status: Home / Self Care | Payer: Self-pay | Attending: Emergency Medicine | Admitting: Emergency Medicine

## 2019-11-21 ENCOUNTER — Encounter (HOSPITAL_COMMUNITY): Payer: Self-pay | Admitting: Emergency Medicine

## 2019-11-21 ENCOUNTER — Other Ambulatory Visit: Payer: Self-pay

## 2019-11-21 DIAGNOSIS — Z5321 Procedure and treatment not carried out due to patient leaving prior to being seen by health care provider: Secondary | ICD-10-CM | POA: Insufficient documentation

## 2019-11-21 DIAGNOSIS — J029 Acute pharyngitis, unspecified: Secondary | ICD-10-CM | POA: Insufficient documentation

## 2019-11-21 DIAGNOSIS — R519 Headache, unspecified: Secondary | ICD-10-CM | POA: Insufficient documentation

## 2019-11-21 DIAGNOSIS — M25562 Pain in left knee: Secondary | ICD-10-CM | POA: Insufficient documentation

## 2019-11-21 DIAGNOSIS — M791 Myalgia, unspecified site: Secondary | ICD-10-CM | POA: Insufficient documentation

## 2019-11-21 NOTE — ED Notes (Signed)
Pt has not returned

## 2019-11-21 NOTE — ED Notes (Signed)
Pt up to desk asking about wait times. Pt informed staff that she cannot stay any longer and that she is tired and just wants to go home. Pt stated if she needs emergency care she will come back.

## 2019-11-21 NOTE — ED Triage Notes (Signed)
C/o L knee pain, headache, sore throat, and generalized body aches since this morning.  No known COVID exposures.  Denies known injury to knee.

## 2019-11-21 NOTE — ED Notes (Signed)
Updated on wait for treatment room.  Pt states she is going to step outside for a little bit.

## 2019-11-29 ENCOUNTER — Ambulatory Visit (HOSPITAL_COMMUNITY)
Admission: EM | Admit: 2019-11-29 | Discharge: 2019-11-29 | Disposition: A | Discharge status: Home / Self Care | Payer: Self-pay | Attending: Physician Assistant | Admitting: Physician Assistant

## 2019-11-29 ENCOUNTER — Encounter (HOSPITAL_COMMUNITY): Payer: Self-pay

## 2019-11-29 ENCOUNTER — Other Ambulatory Visit: Payer: Self-pay

## 2019-11-29 DIAGNOSIS — Z9049 Acquired absence of other specified parts of digestive tract: Secondary | ICD-10-CM | POA: Insufficient documentation

## 2019-11-29 DIAGNOSIS — B349 Viral infection, unspecified: Secondary | ICD-10-CM | POA: Insufficient documentation

## 2019-11-29 DIAGNOSIS — F17210 Nicotine dependence, cigarettes, uncomplicated: Secondary | ICD-10-CM | POA: Insufficient documentation

## 2019-11-29 DIAGNOSIS — K047 Periapical abscess without sinus: Secondary | ICD-10-CM | POA: Insufficient documentation

## 2019-11-29 DIAGNOSIS — Z791 Long term (current) use of non-steroidal anti-inflammatories (NSAID): Secondary | ICD-10-CM | POA: Insufficient documentation

## 2019-11-29 DIAGNOSIS — Z20822 Contact with and (suspected) exposure to covid-19: Secondary | ICD-10-CM | POA: Insufficient documentation

## 2019-11-29 DIAGNOSIS — R509 Fever, unspecified: Secondary | ICD-10-CM

## 2019-11-29 DIAGNOSIS — N739 Female pelvic inflammatory disease, unspecified: Secondary | ICD-10-CM | POA: Insufficient documentation

## 2019-11-29 DIAGNOSIS — Z79899 Other long term (current) drug therapy: Secondary | ICD-10-CM | POA: Insufficient documentation

## 2019-11-29 LAB — POCT RAPID STREP A, ED / UC: Streptococcus, Group A Screen (Direct): NEGATIVE

## 2019-11-29 MED ORDER — ACETAMINOPHEN 325 MG PO TABS
650.0000 mg | ORAL_TABLET | Freq: Four times a day (QID) | ORAL | 0 refills | Status: DC | PRN
Start: 2019-11-29 — End: 2020-09-21

## 2019-11-29 MED ORDER — AMOXICILLIN-POT CLAVULANATE 875-125 MG PO TABS: 1.0000 | ORAL_TABLET | Freq: Two times a day (BID) | ORAL | 0 refills | Status: AC

## 2019-11-29 MED ORDER — IBUPROFEN 600 MG PO TABS
600.0000 mg | ORAL_TABLET | Freq: Four times a day (QID) | ORAL | 0 refills | Status: DC | PRN
Start: 2019-11-29 — End: 2020-09-16

## 2019-11-29 NOTE — ED Triage Notes (Addendum)
Pt presents with complaints of headache and throat swelling feeling x 2 days. Pt reports taken tylenol and ibuprofen at home with no relief. Pt is in no distress during intake.

## 2019-11-29 NOTE — ED Provider Notes (Signed)
[facility]    CSN: [id number] Arrival date & time: 11/29/19  1823      History   Chief Complaint Chief Complaint  Patient presents with  . Fever    HPI Sabrina Harding is a 39 y.o. female.   Patient presents for headache and sore throat.  Symptoms started yesterday.  Describes headache as frontal.  She reports feeling pain with swallowing and also sensation that there is swelling in her throat.  She also reports jaw pain with opening closing mouth.  Slight nasal congestion and cough developing.  No fevers or chills.  No nausea vomiting or diarrhea.  No known sick contacts.  She does report issues with her back teeth previously.  Has not seen a dentist.     Past Medical History:  Diagnosis Date  . Anemia   . Gall stones   . Gonorrhea   . Headache(784.0)   . Heart murmur    childhood  . Trichomonas contact     Patient Active Problem List   Diagnosis Date Noted  . Oral herpes 01/10/2016  . Pelvic inflammatory disease (PID) 01/07/2016  . Gonorrhea in female 01/07/2016  . Pyelonephritis 01/06/2016    Past Surgical History:  Procedure Laterality Date  . CHOLECYSTECTOMY  10/17/2011   Procedure: LAPAROSCOPIC CHOLECYSTECTOMY;  Surgeon: Harl Bowie, MD;  Location: [facility];  Service: General;  Laterality: N/A;  . TONSILLECTOMY    . TUBAL LIGATION      OB History    Gravida  6   Para  0   Term  0   Preterm      AB  3   Living  3     SAB  3   TAB      Ectopic      Multiple      Live Births  3            Home Medications    Prior to Admission medications   Medication Sig Start Date End Date Taking? Authorizing Provider  acetaminophen (TYLENOL) 325 MG tablet Take 2 tablets (650 mg total) by mouth every 6 (six) hours as needed. 11/29/19   Yves Fodor, Marguerita Beards, PA-C  amoxicillin-clavulanate (AUGMENTIN) 875-125 MG tablet Take 1 tablet by mouth 2 (two) times daily for 10 days. 11/29/19 12/09/19  Jaedyn Lard, Marguerita Beards, PA-C  cephALEXin (KEFLEX) 500  MG capsule Take 1 capsule (500 mg total) by mouth 3 (three) times daily. Patient not taking: Reported on 0/96/2836 10/04/45   Delora Fuel, MD  ibuprofen (ADVIL) 600 MG tablet Take 1 tablet (600 mg total) by mouth every 6 (six) hours as needed. 11/29/19   Deago Burruss, Marguerita Beards, PA-C  prochlorperazine (COMPAZINE) 10 MG tablet Take 1 tablet (10 mg total) by mouth every 6 (six) hours as needed for nausea or vomiting (or headache). Patient not taking: Reported on 6/54/6503 5/46/56   Delora Fuel, MD    Family History Family History  Problem Relation Age of Onset  . Hypertension Father   . Healthy Mother     Social History Social History   Tobacco Use  . Smoking status: Current Every Day Smoker    Packs/day: 1.00    Years: 8.00    Pack years: 8.00    Types: Cigarettes  . Smokeless tobacco: Never Used  Vaping Use  . Vaping Use: Never used  Substance Use Topics  . Alcohol use: Yes    Alcohol/week: 0.0 standard drinks    Comment: occasional  . Drug  use: No     Allergies   Patient has no known allergies.   Review of Systems Review of Systems   Physical Exam Triage Vital Signs ED Triage Vitals  Enc Vitals Group     BP 11/29/19 1932 (!) 125/91     Pulse Rate 11/29/19 1932 (!) 110     Resp 11/29/19 1932 19     Temp 11/29/19 1932 98.3 F (36.8 C)     Temp src --      SpO2 11/29/19 1932 100 %     Weight --      Height --      Head Circumference --      Peak Flow --      Pain Score 11/29/19 1930 9     Pain Loc --      Pain Edu? --      Excl. in [place]? --    No data found.  Updated Vital Signs BP (!) 125/91   Pulse (!) 110   Temp 98.3 F (36.8 C)   Resp 19   LMP 09/01/2012 Comment: Pt states she has not had a period since she was "22-something"  SpO2 100%   Visual Acuity Right Eye Distance:   Left Eye Distance:   Bilateral Distance:    Right Eye Near:   Left Eye Near:    Bilateral Near:     Physical Exam Vitals and nursing note reviewed.  Constitutional:       General: She is not in acute distress.    Appearance: She is well-developed. She is not ill-appearing.  HENT:     Head: Normocephalic and atraumatic.     Nose: Congestion and rhinorrhea present.     Mouth/Throat:     Mouth: Mucous membranes are moist.     Comments: Mild erythema in the oropharynx.  Rear molars bilaterally cracked.  Right with debris and gingival swelling surrounding.  Tenderness on palpation.  No trismus.  Controlling secretions. Neck:     Comments: No submandibular or submental swelling. Cardiovascular:     Rate and Rhythm: Normal rate and regular rhythm.     Heart sounds: No murmur heard.      Comments: Rate 80-90 on this exam Pulmonary:     Effort: Pulmonary effort is normal. No respiratory distress.     Breath sounds: Normal breath sounds. No wheezing, rhonchi or rales.  Musculoskeletal:     Cervical back: Neck supple.  Lymphadenopathy:     Cervical: Cervical adenopathy (Mild adenopathy primarily in the right chain.) present.  Skin:    General: Skin is warm and dry.  Neurological:     General: No focal deficit present.     Mental Status: She is alert and oriented to person, place, and time.      UC Treatments / Results  Labs (all labs ordered are listed, but only abnormal results are displayed) Labs Reviewed  SARS CORONAVIRUS 2 (TAT 6-24 HRS)  CULTURE, GROUP A STREP [facility] [place])  POCT RAPID STREP A, ED / UC    EKG   Radiology No results found.  Procedures Procedures (including critical care time)  Medications Ordered in UC Medications - No data to display  Initial Impression / Assessment and Plan / UC Course  I have reviewed the triage vital signs and the nursing notes.  Pertinent labs & imaging results that were available during my care of the patient were reviewed by me and considered in my medical decision making (see chart for details).     #  Viral illness #Dental affection Patient is 39 year old presenting with viral upper  respiratory illness accompanied by dental infection.  Rapid strep negative.  Culture sent.  Covid sent.  Believe jaw pain is likely related to dental infection and broken molars.  Will place on Augmentin for dental infection.  Return and follow-up precautions discussed.  Patient verbalized understanding plan of care   Final Clinical Impressions(s) / UC Diagnoses   Final diagnoses:  Viral illness  Dental infection     Discharge Instructions     Take medications as prescribed - augmentin 2 times a day - tylenole and ibuprofen as prescribed  Believe some your symptoms are related to a virus and others related to dental infection. -If your develop lots of swelling below your jaw, unable to tolerate medicines you need to go to the emergency department -You need to schedule with a dentist to have your teeth evaluated  Follow-up with your primary care  If your Covid-19 test is positive, you will receive a phone call from [facility] regarding your results. Negative test results are not called. Both positive and negative results area always visible on MyChart. If you do not have a MyChart account, sign up instructions are in your discharge papers.   Persons who are directed to care for themselves at home may discontinue isolation under the following conditions:  . At least 10 days have passed since symptom onset and . At least 24 hours have passed without running a fever (this means without the use of fever-reducing medications) and . Other symptoms have improved.  Persons infected with COVID-19 who never develop symptoms may discontinue isolation and other precautions 10 days after the date of their first positive COVID-19 test.      ED Prescriptions    Medication Sig Dispense Auth. Provider   amoxicillin-clavulanate (AUGMENTIN) 875-125 MG tablet Take 1 tablet by mouth 2 (two) times daily for 10 days. 20 tablet Yola Paradiso, Marguerita Beards, PA-C   ibuprofen (ADVIL) 600 MG tablet Take 1 tablet  (600 mg total) by mouth every 6 (six) hours as needed. 30 tablet Yilia Sacca, Marguerita Beards, PA-C   acetaminophen (TYLENOL) 325 MG tablet Take 2 tablets (650 mg total) by mouth every 6 (six) hours as needed. 30 tablet Destina Mantei, Marguerita Beards, PA-C     PDMP not reviewed this encounter.   Purnell Shoemaker, PA-C 11/29/19 2134

## 2019-11-29 NOTE — Discharge Instructions (Addendum)
Take medications as prescribed - augmentin 2 times a day - tylenole and ibuprofen as prescribed  Believe some your symptoms are related to a virus and others related to dental infection. -If your develop lots of swelling below your jaw, unable to tolerate medicines you need to go to the emergency department -You need to schedule with a dentist to have your teeth evaluated  Follow-up with your primary care  If your Covid-19 test is positive, you will receive a phone call from [facility] regarding your results. Negative test results are not called. Both positive and negative results area always visible on MyChart. If you do not have a MyChart account, sign up instructions are in your discharge papers.   Persons who are directed to care for themselves at home may discontinue isolation under the following conditions:   At least 10 days have passed since symptom onset and  At least 24 hours have passed without running a fever (this means without the use of fever-reducing medications) and  Other symptoms have improved.  Persons infected with COVID-19 who never develop symptoms may discontinue isolation and other precautions 10 days after the date of their first positive COVID-19 test.

## 2019-11-30 LAB — SARS CORONAVIRUS 2 (TAT 6-24 HRS): SARS Coronavirus 2: NEGATIVE

## 2019-12-02 LAB — CULTURE, GROUP A STREP (THRC)

## 2020-04-08 ENCOUNTER — Emergency Department (HOSPITAL_COMMUNITY): Payer: HRSA Program

## 2020-04-08 ENCOUNTER — Emergency Department (HOSPITAL_COMMUNITY)
Admission: EM | Admit: 2020-04-08 | Discharge: 2020-04-08 | Disposition: A | Discharge status: Home / Self Care | Payer: HRSA Program | Attending: Emergency Medicine | Admitting: Emergency Medicine

## 2020-04-08 ENCOUNTER — Other Ambulatory Visit: Payer: Self-pay

## 2020-04-08 DIAGNOSIS — R509 Fever, unspecified: Secondary | ICD-10-CM | POA: Diagnosis present

## 2020-04-08 DIAGNOSIS — U071 COVID-19: Secondary | ICD-10-CM | POA: Diagnosis not present

## 2020-04-08 DIAGNOSIS — F17210 Nicotine dependence, cigarettes, uncomplicated: Secondary | ICD-10-CM | POA: Insufficient documentation

## 2020-04-08 LAB — CBC WITH DIFFERENTIAL/PLATELET
Abs Immature Granulocytes: 0.07 10*3/uL (ref 0.00–0.07)
Basophils Absolute: 0 10*3/uL (ref 0.0–0.1)
Basophils Relative: 0 %
Eosinophils Absolute: 0.2 10*3/uL (ref 0.0–0.5)
Eosinophils Relative: 3 %
HCT: 37.8 % (ref 36.0–46.0)
Hemoglobin: 12.1 g/dL (ref 12.0–15.0)
Immature Granulocytes: 1 %
Lymphocytes Relative: 10 %
Lymphs Abs: 0.7 10*3/uL (ref 0.7–4.0)
MCH: 24.5 pg — ABNORMAL LOW (ref 26.0–34.0)
MCHC: 32 g/dL (ref 30.0–36.0)
MCV: 76.7 fL — ABNORMAL LOW (ref 80.0–100.0)
Monocytes Absolute: 0.5 10*3/uL (ref 0.1–1.0)
Monocytes Relative: 8 %
Neutro Abs: 5.1 10*3/uL (ref 1.7–7.7)
Neutrophils Relative %: 78 %
Platelets: 318 10*3/uL (ref 150–400)
RBC: 4.93 MIL/uL (ref 3.87–5.11)
RDW: 16.1 % — ABNORMAL HIGH (ref 11.5–15.5)
WBC: 6.5 10*3/uL (ref 4.0–10.5)
nRBC: 0 % (ref 0.0–0.2)

## 2020-04-08 LAB — COMPREHENSIVE METABOLIC PANEL
ALT: 16 U/L (ref 0–44)
AST: 26 U/L (ref 15–41)
Albumin: 3.4 g/dL — ABNORMAL LOW (ref 3.5–5.0)
Alkaline Phosphatase: 85 U/L (ref 38–126)
Anion gap: 7 (ref 5–15)
BUN: 11 mg/dL (ref 6–20)
CO2: 25 mmol/L (ref 22–32)
Calcium: 8.6 mg/dL — ABNORMAL LOW (ref 8.9–10.3)
Chloride: 103 mmol/L (ref 98–111)
Creatinine, Ser: 1.04 mg/dL — ABNORMAL HIGH (ref 0.44–1.00)
GFR, Estimated: >60 mL/min (ref >60)
Glucose, Bld: 94 mg/dL (ref 70–99)
Potassium: 4.6 mmol/L (ref 3.5–5.1)
Sodium: 135 mmol/L (ref 135–145)
Total Bilirubin: 0.8 mg/dL (ref 0.3–1.2)
Total Protein: 7.2 g/dL (ref 6.5–8.1)

## 2020-04-08 LAB — URINALYSIS, ROUTINE W REFLEX MICROSCOPIC
Bilirubin Urine: NEGATIVE
Glucose, UA: NEGATIVE mg/dL
Hgb urine dipstick: NEGATIVE
Ketones, ur: NEGATIVE mg/dL
Nitrite: NEGATIVE
Protein, ur: NEGATIVE mg/dL
Specific Gravity, Urine: 1.013 (ref 1.005–1.030)
pH: 5 (ref 5.0–8.0)

## 2020-04-08 LAB — I-STAT BETA HCG BLOOD, ED (MC, WL, AP ONLY): I-stat hCG, quantitative: <5 m[IU]/mL (ref <5)

## 2020-04-08 LAB — POC SARS CORONAVIRUS 2 AG -  ED: SARS Coronavirus 2 Ag: POSITIVE — AB

## 2020-04-08 MED ORDER — ONDANSETRON HCL 4 MG/2ML IJ SOLN
4.0000 mg | Freq: Once | INTRAMUSCULAR | Status: AC
  Administered 2020-04-08: 4 mg via INTRAVENOUS
  Filled 2020-04-08: qty 2

## 2020-04-08 MED ORDER — IBUPROFEN 800 MG PO TABS
800.0000 mg | ORAL_TABLET | Freq: Once | ORAL | Status: AC
  Administered 2020-04-08: 800 mg via ORAL
  Filled 2020-04-08: qty 1

## 2020-04-08 MED ORDER — ONDANSETRON 4 MG PO TBDP
4.0000 mg | ORAL_TABLET | Freq: Three times a day (TID) | ORAL | 0 refills | Status: DC | PRN
Start: 2020-04-08 — End: 2020-09-21

## 2020-04-08 MED ORDER — SODIUM CHLORIDE 0.9 % IV BOLUS
500.0000 mL | Freq: Once | INTRAVENOUS | Status: AC
  Administered 2020-04-08: 500 mL via INTRAVENOUS

## 2020-04-08 NOTE — Discharge Instructions (Signed)
Follow-up with your primary care doctor next week.  Return to ER if you develop any difficulty in breathing, chest pain, other new concerning symptom.  Take Tylenol or Motrin as needed for pain and fevers.  Take Zofran as needed for nausea.

## 2020-04-08 NOTE — ED Provider Notes (Signed)
[facility] DEPT Provider Note   CSN: [id number] Arrival date & time: 04/08/20  2029     History Chief Complaint  Patient presents with  . n/v/d    Sabrina Harding is a 40 y.o. female.  Presenting to ER with concern for multiple symptoms.  States that all her symptoms started yesterday, had fever up to 102 at home, has felt very nauseous, body aches, chills.  Had one episode of nonbloody nonbilious emesis.  No diarrhea.  No abdominal pain.  No chest pain.  No difficulty breathing.  States that she feels like she has absolutely no energy left.  Denies any chronic medical conditions.  HPI     Past Medical History:  Diagnosis Date  . Anemia   . Gall stones   . Gonorrhea   . Headache(784.0)   . Heart murmur    childhood  . Trichomonas contact     Patient Active Problem List   Diagnosis Date Noted  . Oral herpes 01/10/2016  . Pelvic inflammatory disease (PID) 01/07/2016  . Gonorrhea in female 01/07/2016  . Pyelonephritis 01/06/2016    Past Surgical History:  Procedure Laterality Date  . CHOLECYSTECTOMY  10/17/2011   Procedure: LAPAROSCOPIC CHOLECYSTECTOMY;  Surgeon: Harl Bowie, MD;  Location: [facility];  Service: General;  Laterality: N/A;  . TONSILLECTOMY    . TUBAL LIGATION       OB History    Gravida  6   Para  0   Term  0   Preterm      AB  3   Living  3     SAB  3   IAB      Ectopic      Multiple      Live Births  3           Family History  Problem Relation Age of Onset  . Hypertension Father   . Healthy Mother     Social History   Tobacco Use  . Smoking status: Current Every Day Smoker    Packs/day: 1.00    Years: 8.00    Pack years: 8.00    Types: Cigarettes  . Smokeless tobacco: Never Used  Vaping Use  . Vaping Use: Never used  Substance Use Topics  . Alcohol use: Yes    Alcohol/week: 0.0 standard drinks    Comment: occasional  . Drug use: No    Home Medications Prior to  Admission medications   Medication Sig Start Date End Date Taking? Authorizing Provider  ondansetron (ZOFRAN ODT) 4 MG disintegrating tablet Take 1 tablet (4 mg total) by mouth every 8 (eight) hours as needed for nausea or vomiting. 04/08/20  Yes Lucrezia Starch, MD  acetaminophen (TYLENOL) 325 MG tablet Take 2 tablets (650 mg total) by mouth every 6 (six) hours as needed. 11/29/19   Darr, Edison Nasuti, PA-C  cephALEXin (KEFLEX) 500 MG capsule Take 1 capsule (500 mg total) by mouth 3 (three) times daily. Patient not taking: Reported on Q000111Q [id number]   Delora Fuel, MD  ibuprofen (ADVIL) 600 MG tablet Take 1 tablet (600 mg total) by mouth every 6 (six) hours as needed. 11/29/19   Darr, Edison Nasuti, PA-C  prochlorperazine (COMPAZINE) 10 MG tablet Take 1 tablet (10 mg total) by mouth every 6 (six) hours as needed for nausea or vomiting (or headache). Patient not taking: Reported on Q000111Q [id number]   Delora Fuel, MD    Allergies    Patient has no known allergies.  Review of Systems   Review of Systems  Constitutional: Positive for chills, fatigue and fever.  HENT: Negative for ear pain and sore throat.   Eyes: Negative for pain and visual disturbance.  Respiratory: Positive for cough. Negative for shortness of breath.   Cardiovascular: Negative for chest pain and palpitations.  Gastrointestinal: Negative for abdominal pain and vomiting.  Genitourinary: Negative for dysuria and hematuria.  Musculoskeletal: Negative for arthralgias and back pain.  Skin: Negative for color change and rash.  Neurological: Negative for seizures and syncope.  All other systems reviewed and are negative.   Physical Exam Updated Vital Signs BP 132/65 (BP Location: Left Arm)   Pulse 78   Temp 99.1 F (37.3 C) (Oral)   Resp 16   Ht 5\' 5"  (1.651 m)   Wt 90.3 kg   LMP 09/01/2012 Comment: Pt states she has not had a period since she was "22-something"  SpO2 100%   BMI 33.12 kg/m   Physical Exam Vitals and nursing  note reviewed.  Constitutional:      General: She is not in acute distress.    Appearance: She is well-developed and well-nourished.  HENT:     Head: Normocephalic and atraumatic.  Eyes:     Conjunctiva/sclera: Conjunctivae normal.  Cardiovascular:     Rate and Rhythm: Normal rate and regular rhythm.     Heart sounds: No murmur heard.   Pulmonary:     Effort: Pulmonary effort is normal. No respiratory distress.     Breath sounds: Normal breath sounds.  Abdominal:     Palpations: Abdomen is soft.     Tenderness: There is no abdominal tenderness.  Musculoskeletal:        General: No edema.     Cervical back: Neck supple.  Skin:    General: Skin is warm and dry.  Neurological:     General: No focal deficit present.     Mental Status: She is alert and oriented to person, place, and time.  Psychiatric:        Mood and Affect: Mood and affect and mood normal.        Behavior: Behavior normal.     ED Results / Procedures / Treatments   Labs (all labs ordered are listed, but only abnormal results are displayed) Labs Reviewed  CBC WITH DIFFERENTIAL/PLATELET - Abnormal; Notable for the following components:      Result Value   MCV 76.7 (*)    MCH 24.5 (*)    RDW 16.1 (*)    All other components within normal limits  COMPREHENSIVE METABOLIC PANEL - Abnormal; Notable for the following components:   Creatinine, Ser 1.04 (*)    Calcium 8.6 (*)    Albumin 3.4 (*)    All other components within normal limits  URINALYSIS, ROUTINE W REFLEX MICROSCOPIC - Abnormal; Notable for the following components:   Leukocytes,Ua MODERATE (*)    Bacteria, UA RARE (*)    All other components within normal limits  POC SARS CORONAVIRUS 2 AG -  ED - Abnormal; Notable for the following components:   SARS Coronavirus 2 Ag POSITIVE (*)    All other components within normal limits  I-STAT BETA HCG BLOOD, ED ([facility], [facility], [facility] ONLY)    EKG EKG Interpretation  Date/Time:  Saturday April 08 2020  21:47:00 EST Ventricular Rate:  101 PR Interval:    QRS Duration: 89 QT Interval:  348 QTC Calculation: 452 R Axis:   54 Text Interpretation: Sinus tachycardia Atrial  premature complex Probable left atrial enlargement Probable inferior infarct, old Confirmed by Marianna Fuss ([id number]) on 04/08/2020 9:53:07 PM   Radiology DG Chest Portable 1 View  Result Date: 04/08/2020 CLINICAL DATA:  Fever EXAM: PORTABLE CHEST 1 VIEW COMPARISON:  07/24/2018 FINDINGS: The heart size and mediastinal contours are within normal limits. Both lungs are clear. The visualized skeletal structures are unremarkable. IMPRESSION: No active disease. Electronically Signed   By: Jasmine Pang M.D.   On: 04/08/2020 21:21    Procedures Procedures (including critical care time)  Medications Ordered in ED Medications  ibuprofen (ADVIL) tablet 800 mg (800 mg Oral Given 04/08/20 2157)  ondansetron (ZOFRAN) injection 4 mg (4 mg Intravenous Given 04/08/20 2155)  sodium chloride 0.9 % bolus 500 mL (500 mLs Intravenous New Bag/Given 04/08/20 2157)    ED Course  I have reviewed the triage vital signs and the nursing notes.  Pertinent labs & imaging results that were available during my care of the patient were reviewed by me and considered in my medical decision making (see chart for details).    MDM Rules/Calculators/A&P                         40 year old lady presenting to ER with symptoms concerning for COVID-19.  Covid is positive.  CXR negative.  Her lungs are clear, she has no tachypnea or hypoxia.  At present believe she is appropriate for outpatient management.  Reviewed return precautions, isolation precautions and discharged home.  Donda N Mcgurk was evaluated in Emergency Department on 04/09/2020 for the symptoms described in the history of present illness. She was evaluated in the context of the global COVID-19 pandemic, which necessitated consideration that the patient might be at risk for infection with the  SARS-CoV-2 virus that causes COVID-19. Institutional protocols and algorithms that pertain to the evaluation of patients at risk for COVID-19 are in a state of rapid change based on information released by regulatory bodies including the CDC and federal and state organizations. These policies and algorithms were followed during the patient's care in the ED.   After the discussed management above, the patient was determined to be safe for discharge.  The patient was in agreement with this plan and all questions regarding their care were answered.  ED return precautions were discussed and the patient will return to the ED with any significant worsening of condition.  Final Clinical Impression(s) / ED Diagnoses Final diagnoses:  COVID-19    Rx / DC Orders ED Discharge Orders         Ordered    ondansetron (ZOFRAN ODT) 4 MG disintegrating tablet  Every 8 hours PRN        04/08/20 2243           Milagros Loll, MD 04/09/20 1550

## 2020-04-08 NOTE — ED Triage Notes (Addendum)
Patient BIB EMS from home c/o N/V/D started yesterday. EMS reports fever 102.3 at home. Pt crying and unable to asnwer questions.

## 2020-09-15 ENCOUNTER — Encounter (HOSPITAL_COMMUNITY): Payer: Self-pay

## 2020-09-15 ENCOUNTER — Other Ambulatory Visit: Payer: Self-pay

## 2020-09-15 ENCOUNTER — Emergency Department (HOSPITAL_COMMUNITY)
Admission: EM | Admit: 2020-09-15 | Discharge: 2020-09-17 | Disposition: A | Discharge status: Other Acute Inpatient Hospital | Payer: Self-pay | Attending: Emergency Medicine | Admitting: Emergency Medicine

## 2020-09-15 DIAGNOSIS — R45851 Suicidal ideations: Secondary | ICD-10-CM | POA: Insufficient documentation

## 2020-09-15 DIAGNOSIS — F17210 Nicotine dependence, cigarettes, uncomplicated: Secondary | ICD-10-CM | POA: Insufficient documentation

## 2020-09-15 DIAGNOSIS — F1099 Alcohol use, unspecified with unspecified alcohol-induced disorder: Secondary | ICD-10-CM | POA: Insufficient documentation

## 2020-09-15 DIAGNOSIS — F332 Major depressive disorder, recurrent severe without psychotic features: Secondary | ICD-10-CM | POA: Insufficient documentation

## 2020-09-15 DIAGNOSIS — F322 Major depressive disorder, single episode, severe without psychotic features: Secondary | ICD-10-CM | POA: Diagnosis present

## 2020-09-15 DIAGNOSIS — Z20822 Contact with and (suspected) exposure to covid-19: Secondary | ICD-10-CM | POA: Insufficient documentation

## 2020-09-15 DIAGNOSIS — Y900 Blood alcohol level of less than 20 mg/100 ml: Secondary | ICD-10-CM | POA: Insufficient documentation

## 2020-09-15 LAB — COMPREHENSIVE METABOLIC PANEL
ALT: 13 U/L (ref 0–44)
AST: 16 U/L (ref 15–41)
Albumin: 3.6 g/dL (ref 3.5–5.0)
Alkaline Phosphatase: 80 U/L (ref 38–126)
Anion gap: 7 (ref 5–15)
BUN: 11 mg/dL (ref 6–20)
CO2: 27 mmol/L (ref 22–32)
Calcium: 9.3 mg/dL (ref 8.9–10.3)
Chloride: 107 mmol/L (ref 98–111)
Creatinine, Ser: 0.85 mg/dL (ref 0.44–1.00)
GFR, Estimated: >60 mL/min (ref >60)
Glucose, Bld: 114 mg/dL — ABNORMAL HIGH (ref 70–99)
Potassium: 3.8 mmol/L (ref 3.5–5.1)
Sodium: 141 mmol/L (ref 135–145)
Total Bilirubin: 0.4 mg/dL (ref 0.3–1.2)
Total Protein: 7.4 g/dL (ref 6.5–8.1)

## 2020-09-15 LAB — CBC WITH DIFFERENTIAL/PLATELET
Abs Immature Granulocytes: 0.03 10*3/uL (ref 0.00–0.07)
Basophils Absolute: 0 10*3/uL (ref 0.0–0.1)
Basophils Relative: 0 %
Eosinophils Absolute: 0.2 10*3/uL (ref 0.0–0.5)
Eosinophils Relative: 4 %
HCT: 38.6 % (ref 36.0–46.0)
Hemoglobin: 12.3 g/dL (ref 12.0–15.0)
Immature Granulocytes: 0 %
Lymphocytes Relative: 26 %
Lymphs Abs: 1.8 10*3/uL (ref 0.7–4.0)
MCH: 24.8 pg — ABNORMAL LOW (ref 26.0–34.0)
MCHC: 31.9 g/dL (ref 30.0–36.0)
MCV: 77.8 fL — ABNORMAL LOW (ref 80.0–100.0)
Monocytes Absolute: 0.5 10*3/uL (ref 0.1–1.0)
Monocytes Relative: 7 %
Neutro Abs: 4.4 10*3/uL (ref 1.7–7.7)
Neutrophils Relative %: 63 %
Platelets: 334 10*3/uL (ref 150–400)
RBC: 4.96 MIL/uL (ref 3.87–5.11)
RDW: 16.1 % — ABNORMAL HIGH (ref 11.5–15.5)
WBC: 6.9 10*3/uL (ref 4.0–10.5)
nRBC: 0 % (ref 0.0–0.2)

## 2020-09-15 LAB — RAPID URINE DRUG SCREEN, HOSP PERFORMED
Amphetamines: NOT DETECTED
Barbiturates: NOT DETECTED
Benzodiazepines: NOT DETECTED
Cocaine: NOT DETECTED
Opiates: NOT DETECTED
Tetrahydrocannabinol: NOT DETECTED

## 2020-09-15 LAB — ETHANOL: Alcohol, Ethyl (B): <10 mg/dL (ref <10)

## 2020-09-15 LAB — RESP PANEL BY RT-PCR (FLU A&B, COVID) ARPGX2
Influenza A by PCR: NEGATIVE
Influenza B by PCR: NEGATIVE
SARS Coronavirus 2 by RT PCR: NEGATIVE

## 2020-09-15 LAB — I-STAT BETA HCG BLOOD, ED (MC, WL, AP ONLY): I-stat hCG, quantitative: <5 m[IU]/mL (ref <5)

## 2020-09-15 LAB — ACETAMINOPHEN LEVEL: Acetaminophen (Tylenol), Serum: <10 ug/mL — ABNORMAL LOW (ref 10–30)

## 2020-09-15 LAB — SALICYLATE LEVEL: Salicylate Lvl: <7 mg/dL — ABNORMAL LOW (ref 7.0–30.0)

## 2020-09-15 MED ORDER — IBUPROFEN 200 MG PO TABS
600.0000 mg | ORAL_TABLET | Freq: Once | ORAL | Status: AC
  Administered 2020-09-15: 600 mg via ORAL
  Filled 2020-09-15: qty 3

## 2020-09-15 MED ORDER — BUTALBITAL-APAP-CAFFEINE 50-325-40 MG PO TABS: 1.0000 | ORAL_TABLET | Freq: Once | ORAL | Status: DC

## 2020-09-15 NOTE — ED Notes (Signed)
Pt changed into burgundy scrubs and belongings locked in cabinet.

## 2020-09-15 NOTE — ED Notes (Signed)
Pt. wanded by security, changed into scrubs. 1 pt belonging bag in cabinet 1-4

## 2020-09-15 NOTE — ED Notes (Signed)
TTS machine at pt bedside for consult
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
## 2020-09-15 NOTE — ED Notes (Signed)
Sandwich and drink provided
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
## 2020-09-16 ENCOUNTER — Other Ambulatory Visit: Payer: Self-pay | Admitting: Psychiatry

## 2020-09-16 DIAGNOSIS — F322 Major depressive disorder, single episode, severe without psychotic features: Secondary | ICD-10-CM | POA: Diagnosis present

## 2020-09-16 MED ORDER — SERTRALINE HCL 50 MG PO TABS
50.0000 mg | ORAL_TABLET | Freq: Every day | ORAL | Status: DC
  Administered 2020-09-16: 50 mg via ORAL
  Filled 2020-09-16: qty 1

## 2020-09-16 NOTE — ED Notes (Addendum)
[phone] called 4 times with no answer.

## 2020-09-16 NOTE — ED Notes (Signed)
Patient received dinner tray 

## 2020-09-16 NOTE — ED Notes (Signed)
Patient was given phone with sitter at bedside so patient was able to write down personal phone numbers with crayon. RN approved

## 2020-09-16 NOTE — ED Provider Notes (Signed)
Emergency Medicine Observation Re-evaluation Note  Sabrina Harding is a 40 y.o. female, seen on rounds today.  Pt initially presented to the ED for complaints of Suicidal Currently, the patient is sleeping.  Physical Exam  BP 116/82 (BP Location: Right Arm)   Pulse 76   Temp 97.7 F (36.5 C) (Oral)   Resp 16   Ht 5\' 5"  (1.651 m)   Wt 120.2 kg   LMP 09/01/2012 Comment: Pt states she has not had a period since she was "22-something"  SpO2 100%   BMI 44.10 kg/m  Physical Exam General: Cooperative Cardiac: Normal heart rate Lungs: Normal respiratory rate Psych: Hyperactive  ED Course / MDM  EKG:EKG Interpretation  Date/Time:  Friday September 15 2020 10:00:54 EDT Ventricular Rate:  83 PR Interval:  162 QRS Duration: 90 QT Interval:  380 QTC Calculation: 446 R Axis:   58 Text Interpretation: Normal sinus rhythm Possible Left atrial enlargement Borderline ECG No significant change since last tracing Confirmed by Calvert Cantor [phone]) on 09/15/2020 10:06:07 AM  I have reviewed the labs performed to date as well as medications administered while in observation.  Recent changes in the last 24 hours include seen by psychiatry with plans for hospitalization.  Plan  Current plan is for admit to psychiatry. Patient is under full IVC at this time.   Daleen Bo, MD 09/16/20 1536
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
## 2020-09-16 NOTE — ED Notes (Signed)
Patient taking a shower.

## 2020-09-16 NOTE — ED Notes (Signed)
Called [facility] again. Was placed on hold for 7 minutes. Called back to [facility] spoke with Mechele Claude.She requested that RN called back here for report
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
## 2020-09-16 NOTE — ED Notes (Signed)
Placed patient sticker on patients phone and placed it back into the patients locker in patients bag.

## 2020-09-16 NOTE — BH Assessment (Signed)
Patient has been accepted to [facility] [id number] after 2000 hours

## 2020-09-16 NOTE — ED Notes (Signed)
Patient coloring Sabrina Harding search

## 2020-09-16 NOTE — ED Notes (Signed)
Gave patient a sandwich and soduko pages

## 2020-09-16 NOTE — ED Notes (Signed)
Patient making a personal phone call

## 2020-09-16 NOTE — ED Notes (Signed)
I just with [facility]. She asked if we could send at 11p because they are severely short staffed. I told her I was fine with it as long as the Agricultural consultant

## 2020-09-17 ENCOUNTER — Other Ambulatory Visit: Payer: Self-pay

## 2020-09-17 ENCOUNTER — Inpatient Hospital Stay (HOSPITAL_COMMUNITY)
Admission: AD | Admit: 2020-09-17 | Discharge: 2020-09-21 | DRG: 881 | Disposition: A | Discharge status: Home / Self Care | Payer: No Typology Code available for payment source | Attending: Psychiatry | Admitting: Psychiatry

## 2020-09-17 ENCOUNTER — Encounter (HOSPITAL_COMMUNITY): Payer: Self-pay | Admitting: Psychiatry

## 2020-09-17 DIAGNOSIS — F17210 Nicotine dependence, cigarettes, uncomplicated: Secondary | ICD-10-CM | POA: Diagnosis present

## 2020-09-17 DIAGNOSIS — F321 Major depressive disorder, single episode, moderate: Secondary | ICD-10-CM | POA: Diagnosis not present

## 2020-09-17 DIAGNOSIS — Z79899 Other long term (current) drug therapy: Secondary | ICD-10-CM

## 2020-09-17 DIAGNOSIS — F419 Anxiety disorder, unspecified: Secondary | ICD-10-CM | POA: Diagnosis present

## 2020-09-17 DIAGNOSIS — G4700 Insomnia, unspecified: Secondary | ICD-10-CM | POA: Diagnosis present

## 2020-09-17 DIAGNOSIS — F329 Major depressive disorder, single episode, unspecified: Secondary | ICD-10-CM | POA: Diagnosis present

## 2020-09-17 DIAGNOSIS — R45851 Suicidal ideations: Secondary | ICD-10-CM | POA: Diagnosis present

## 2020-09-17 MED ORDER — SERTRALINE HCL 50 MG PO TABS
50.0000 mg | ORAL_TABLET | Freq: Every day | ORAL | Status: DC
  Administered 2020-09-17 – 2020-09-21 (×5): 50 mg via ORAL
  Filled 2020-09-17 (×3): qty 1
  Filled 2020-09-17: qty 7
  Filled 2020-09-17 (×3): qty 1

## 2020-09-17 NOTE — BHH Group Notes (Signed)
[place] LCSW Group Therapy Note  09/17/2020    Type of Therapy and Topic:  Group Therapy:  A Hero Worthy of Support  Participation Level:  Active   Description of Group:  Patients in this group were introduced to the concept that additional supports including self-support are an essential part of recovery.  Matching needs with supports to help fulfill those needs was explained.  Establishing boundaries that can gradually be increased or decreased was described, with patients giving their own examples of establishing appropriate boundaries in their lives.  A song entitled "My Own Hero" was played and a group discussion ensued in which patients stated it inspired them to help themselves in order to succeed, because other people cannot achieve their goals such as sobriety or stability for them.  A song was played called "I Am Enough" which led to a discussion about being willing to believe we are worth the effort of being a self-support.   Therapeutic Goals: 1)  demonstrate the importance of being a key part of one's own support system 2)  discuss various available supports 3)  encourage patient to use music as part of their self-support and focus on goals 4)  elicit ideas from patients about supports that need to be added   Summary of Patient Progress:  The patient expressed that one support she needs when she leaves the hospital is a Social worker.  The patient showed insight at times, but was monopolizing throughout group and was difficult to redirect.   Therapeutic Modalities:   Motivational Interviewing Activity  Maretta Los

## 2020-09-17 NOTE — Progress Notes (Signed)
     09/17/20 0400  Psych Admission Type (Psych Patients Only)  Admission Status Involuntary  Psychosocial Assessment  Patient Complaints Anger;Depression  Eye Contact Fair  Facial Expression Worried  Affect Anxious;Depressed  Speech Logical/coherent  Interaction Assertive  Motor Activity Other (Comment) (WDL)  Appearance/Hygiene Poor hygiene;In scrubs  Behavior Characteristics Cooperative;Appropriate to situation  Mood Depressed;Pleasant  Thought Process  Coherency WDL  Content Blaming self  Delusions WDL  Perception WDL  Hallucination None reported or observed  Judgment Poor  Confusion WDL  Danger to Self  Current suicidal ideation? Denies  Self-Injurious Behavior No self-injurious ideation or behavior indicators observed or expressed   Agreement Not to Harm Self Yes  Description of Agreement verbal contract for safety  Danger to Others  Danger to Others None reported or observed

## 2020-09-17 NOTE — Progress Notes (Signed)
   09/17/20 1200  Psych Admission Type (Psych Patients Only)  Admission Status Involuntary  Psychosocial Assessment  Patient Complaints Depression  Eye Contact Fair  Facial Expression Worried  Affect Anxious;Depressed  Speech Logical/coherent  Interaction Assertive  Motor Activity Other (Comment) (WDL)  Appearance/Hygiene Poor hygiene;In scrubs  Behavior Characteristics Cooperative;Appropriate to situation  Mood Depressed  Thought Process  Coherency WDL  Content Blaming self  Delusions WDL  Perception WDL  Hallucination None reported or observed  Judgment Poor  Confusion WDL  Danger to Self  Current suicidal ideation? Denies  Self-Injurious Behavior No self-injurious ideation or behavior indicators observed or expressed   Agreement Not to Harm Self Yes  Description of Agreement verbal contract for safety  Danger to Others  Danger to Others None reported or observed

## 2020-09-17 NOTE — BHH Group Notes (Signed)
The focus of this group is to help patients establish daily goals to achieve during treatment and discuss how the patient can incorporate goal setting into their daily lives to aide in recovery.  Pt did not attend group 
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
## 2020-09-17 NOTE — ED Notes (Signed)
[employer] here is transport patient to [facility]

## 2020-09-17 NOTE — H&P (Signed)
Psychiatric Admission Assessment Adult  Patient Identification: Sabrina Harding MRN:  [id number] Date of Evaluation:  09/17/2020 Chief Complaint:  MDD (major depressive disorder) [F32.9] Principal Diagnosis: <principal problem not specified> Diagnosis:  Active Problems:   MDD (major depressive disorder)  History of Present Illness:  Miss. Sabrina Harding is a 40 yr old female who presents under IVC after attempted suicide by holding a knife to her throat. 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  She reports that she normal can handle the things that life throws at her but when it comes to her kids this is the trigger for her. She reports that while she was at work she received the news that her kids would be changing their last names. She reports she tried to keep working but one of her managers realized something was wrong. They went out to her car to smoke and she reports this allowed her to refocus enough to finish her work. She reports she went home and planned to spend the evening using her coping skills. She put on music and planned to re after eating a burger and drinking a mixed drink. Her friend then video chatted her. The friend noticed that something was off and so insisted on staying on the line with her all night. She then went to sleep. However, she had written letters and sent them to another friend with instructions to send them to her kids if something happened to her. When she went to sleep she reported that she did not want to wake up.  The next morning she heard her mom trying to get into the room and her father was constantly calling her. She states she just decided that was it. She barricaded the door and grabbed a knife. She reports that her father is her hero. When she answer the phone from him he said "You can't do this you're my daughter." At that point she realized she did not want to go through with it and dropped the knife she let the police come in through  the window and they talked with her. When they asked her to come to the hospital she agreed.   She reports that her mother has been telling her she needs to go to therapy. She reports that she has always had a strong support network, she reports that she can always call her mother, sister, or work friend. But she does feel like sometimes she is being a burden to these people. She is open to seeing a therapist outpatient.  She initially did not want the Zoloft as she had a bad response to the medications from her only other hospitalization. When asked she reports taking Ambien and Seroquel and she did not like the way it made her fell. She reports feeling like a zombie and not feeling alive so she stopped taking it. Discussed that SSRI's rarely have a numbing effect like she experienced. She eventually did decide to continue with it for now.  She reports she uses no illicit substances. She reports she drinks no EtOH. She does smoke cigarettes. She reports no SI, HI, or AVH. She has no other concerns at present.  Associated Signs/Symptoms: Depression Symptoms:  depressed mood, anhedonia, fatigue, hopelessness, suicidal attempt, anxiety, loss of energy/fatigue, disturbed sleep, Duration of Depression Symptoms: Greater than two weeks  (Hypo) Manic Symptoms:   None at present Anxiety Symptoms:   None at present Psychotic Symptoms:   None at present PTSD Symptoms: NA Total Time spent with  patient:   I personally spent 50 minutes on the unit in direct patient care. The direct patient care time included face-to-face time with the patient, reviewing the patient's chart, communicating with other professionals, and coordinating care. Greater than 50% of this time was spent in counseling or coordinating care with the patient regarding goals of hospitalization, psycho-education, and discharge planning needs.   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 No.  Has the patient been a risk to self in the past 6 months? No.  Has the patient been a risk to self within the distant past? No.  Is the patient a risk to others? No.  Has the patient been a risk to others in the past 6 months? No.  Has the patient been a risk to others within the distant past? No.   Prior Inpatient Therapy:  2003 Prior Outpatient Therapy:  None  Alcohol Screening: Patient refused Alcohol Screening Tool: Yes 1. How often do you have a drink containing alcohol?: Never 2. How many drinks containing alcohol do you have on a typical day when you are drinking?: 1 or 2 3. How often do you have six or more drinks on one occasion?: Never AUDIT-C Score: 0 9. Have you or someone else been injured as a result of your drinking?: No 10. Has a relative or friend or a doctor or another health worker been concerned about your drinking or suggested you cut down?: No Alcohol Use Disorder Identification Test Final Score (AUDIT): 0 Substance Abuse History in the last 12 months:  No. Consequences of Substance Abuse: NA Previous Psychotropic Medications: Yes  Psychological Evaluations: No  Past Medical History:  Past Medical History:  Diagnosis Date   Anemia    Gall stones    Gonorrhea    Headache(784.0)    Heart murmur    childhood   Trichomonas contact     Past Surgical History:  Procedure Laterality Date   CHOLECYSTECTOMY  10/17/2011   Procedure: LAPAROSCOPIC CHOLECYSTECTOMY;  Surgeon: Harl Bowie, MD;  Location: [facility] OR;  Service: General;  Laterality: N/A;   TONSILLECTOMY     TUBAL LIGATION     Family History:  Family History  Problem Relation Age of Onset   Hypertension Father    Healthy Mother    Family Psychiatric  History: Biological Mother: Schizoaffective Tobacco Screening:   Social History:  Social History   Substance and Sexual Activity  Alcohol Use Yes   Alcohol/week: 0.0 standard drinks   Comment:  occasional     Social History   Substance and Sexual Activity  Drug Use No    Additional Social History:                           Allergies:  No Known Allergies Lab Results: No results found for this or any previous visit (from the past 48 hour(s)).  Blood Alcohol level:  Lab Results  Component Value Date   ETH <10 45/40/9811    Metabolic Disorder Labs:  No results found for: HGBA1C, MPG Lab Results  Component Value Date   PROLACTIN 6.8 09/15/2015   No results found for: CHOL, TRIG, HDL, CHOLHDL, VLDL, LDLCALC  Current Medications: Current Facility-Administered Medications  Medication Dose Route Frequency Provider Last Rate Last Admin   sertraline (ZOLOFT) tablet 50 mg  50 mg Oral Daily Leevy-Johnson, Blaine Hamper, NP  50 mg at 09/17/20 1538   PTA Medications: Medications Prior to Admission  Medication Sig Dispense Refill Last Dose   acetaminophen (TYLENOL) 325 MG tablet Take 2 tablets (650 mg total) by mouth every 6 (six) hours as needed. (Patient not taking: No sig reported) 30 tablet 0 Not Taking   cephALEXin (KEFLEX) 500 MG capsule Take 1 capsule (500 mg total) by mouth 3 (three) times daily. (Patient not taking: No sig reported) 30 capsule 0 Not Taking   ondansetron (ZOFRAN ODT) 4 MG disintegrating tablet Take 1 tablet (4 mg total) by mouth every 8 (eight) hours as needed for nausea or vomiting. (Patient not taking: No sig reported) 20 tablet 0 Not Taking   prochlorperazine (COMPAZINE) 10 MG tablet Take 1 tablet (10 mg total) by mouth every 6 (six) hours as needed for nausea or vomiting (or headache). (Patient not taking: No sig reported) 20 tablet 0 Not Taking    Musculoskeletal: Strength & Muscle Tone: within normal limits Gait & Station: normal Patient leans: N/A            Psychiatric Specialty Exam:  Presentation  General Appearance:  Appropriate for Environment; Casual Eye Contact: Good Speech: Clear and Coherent; Normal Rate Speech  Volume: Normal Handedness: No data recorded  Mood and Affect  Mood: Dysphoric Affect: Appropriate  Thought Process  Thought Processes: Coherent; Goal Directed Duration of Psychotic Symptoms: No data recorded Past Diagnosis of Schizophrenia or Psychoactive disorder: No  Descriptions of Associations:Intact Orientation:Full (Time, Place and Person) Thought Content:WDL Hallucinations:Hallucinations: None Ideas of Reference:None Suicidal Thoughts:Suicidal Thoughts: No Homicidal Thoughts:Homicidal Thoughts: No  Sensorium  Memory: Immediate Fair; Recent Fair Judgment: Fair Insight: Fair  Community education officer  Concentration: Good Attention Span: Good Recall: Good Fund of Knowledge: Good Language: Good  Psychomotor Activity  Psychomotor Activity: Psychomotor Activity: Normal  Assets  Assets: Desire for Improvement; Resilience; Social Support  Sleep  Sleep: Sleep: Good   Physical Exam: Physical Exam Vitals and nursing note reviewed.  Constitutional:      General: She is not in acute distress.    Appearance: Normal appearance. She is obese. She is not ill-appearing or toxic-appearing.  HENT:     Head: Normocephalic and atraumatic.  Cardiovascular:     Rate and Rhythm: Normal rate.  Pulmonary:     Effort: Pulmonary effort is normal.  Musculoskeletal:        General: Normal range of motion.  Neurological:     Mental Status: She is alert.   Review of Systems  Constitutional:  Negative for chills.  Respiratory:  Negative for cough and shortness of breath.   Cardiovascular:  Negative for chest pain.  Gastrointestinal:  Negative for abdominal pain.  Neurological:  Negative for weakness and headaches.  Psychiatric/Behavioral:  Positive for depression. Negative for hallucinations, substance abuse and suicidal ideas. The patient is not nervous/anxious.   Blood pressure (!) 117/105, pulse 97, temperature 98.1 F (36.7 C), temperature source Oral, resp.  rate 16, height 5\' 5"  (1.651 m), weight 118.4 kg, last menstrual period 09/01/2012, SpO2 93 %. Body mass index is 43.43 kg/m.  Treatment Plan Summary: Daily contact with patient to assess and evaluate symptoms and progress in treatment  She has been on Zoloft since admission to the hospital. She has decided to continue taking it. She is will to establish therapy. Will continue to monitor. Encouraged her to attend group therapy to develop and refine coping skills.   Depression/Suicide Attempt -Continue Zoloft 50 mg daily   Observation Level/Precautions:  15 minute checks  Laboratory:  CMP- QIX:658 (high)  Salicylate/Acetaminophen/EtOH: Neg  Psychotherapy:    Medications:  Zoloft 50 mg   Consultations:    Discharge Concerns:    Estimated LOS:2-4 days  Other:     Physician Treatment Plan for Primary Diagnosis: <principal problem not specified> Long Term Goal(s): Improvement in symptoms so as ready for discharge  Short Term Goals: Ability to identify changes in lifestyle to reduce recurrence of condition will improve, Ability to verbalize feelings will improve, Ability to disclose and discuss suicidal ideas, and Ability to identify and develop effective coping behaviors will improve  Physician Treatment Plan for Secondary Diagnosis: Active Problems:   MDD (major depressive disorder)  Long Term Goal(s): Improvement in symptoms so as ready for discharge  Short Term Goals: Ability to identify changes in lifestyle to reduce recurrence of condition will improve, Ability to verbalize feelings will improve, Ability to disclose and discuss suicidal ideas, and Ability to identify and develop effective coping behaviors will improve  I certify that inpatient services furnished can reasonably be expected to improve the patient's condition.    Briant Cedar, MD 6/12/20223:44 PM

## 2020-09-17 NOTE — Progress Notes (Signed)
Pt stated will notify her family today of her hospitalization at [facility].

## 2020-09-17 NOTE — Tx Team (Signed)
Initial Treatment Plan 09/17/2020 3:49 AM Sabrina Harding [id number]    PATIENT STRESSORS: Other: Only stressors are if it is things involving her kids.   PATIENT STRENGTHS: Ability for insight Average or above average intelligence Communication skills General fund of knowledge Supportive family/friends   PATIENT IDENTIFIED PROBLEMS: Anger  Depression  Suicidal Ideation                 DISCHARGE CRITERIA:  Ability to meet basic life and health needs Improved stabilization in mood, thinking, and/or behavior Motivation to continue treatment in a less acute level of care Verbal commitment to aftercare and medication compliance  PRELIMINARY DISCHARGE PLAN: Attend aftercare/continuing care group Outpatient therapy Participate in family therapy  PATIENT/FAMILY INVOLVEMENT: This treatment plan has been presented to and reviewed with the patient, Sabrina Harding.  The patient and family have been given the opportunity to ask questions and make suggestions.  Maryellen Pile, RN 09/17/2020, 3:49 AM

## 2020-09-17 NOTE — Progress Notes (Signed)
    Initial Admission Note:  Pt is a 40 year old female admitted to the unit from [facility] under IVC for symptoms of depression a,d suicidal ideation with a plan.  Pt was reportedly barricaded in her bedroom with a knife in her hand when [employer]  arrived and crawled in from the back of the room to disarm pt.  Pt currently denies suicidal ideation while in [facility] and verbally contracts for safety.  Pt is alert and oriented x 4.  Pt presents with anxious affect and mood with anger as her current complaint.  Pt reports stressors as "only if it involves my kids."  Pt would not elaborate on her anger but states, "she had a moment."  Admission plan of care reviewed and consent for treatment signed.  Skin assessment and personal belongings completed.  Skin is dry and intact.  No contraband found.  Routine safety checks initiated.  Pt oriented to the unit, staff and room.  Pt provided a snack and beverage.  Pt verbalizes understanding of unit rules/protocols.  Pt is safe on the nit at this time.
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
## 2020-09-18 DIAGNOSIS — F321 Major depressive disorder, single episode, moderate: Secondary | ICD-10-CM

## 2020-09-18 NOTE — Progress Notes (Signed)
Recreation Therapy Notes  Date:  6.13.22 Time: 0930 Location: [address] Dayroom  Group Topic: Stress Management  Goal Area(s) Addresses:  Patient will identify positive stress management techniques. Patient will identify benefits of using stress management post d/c.  Intervention: Stress Managment  Activity :  Meditation.  LRT played a meditation that focused on setting boundaries for your own self care. Patients were to listen and follow as meditation was read to fully engage in activity.  Education:  Stress Management, Discharge Planning.   Education Outcome: Acknowledges Education  Clinical Observations/Feedback:  Pt did not attend group session.    Victorino Sparrow, LRT/CTRS         Victorino Sparrow A 09/18/2020 11:35 AM

## 2020-09-18 NOTE — Progress Notes (Signed)
[facility] MD Progress Note  09/18/2020 11:42 AM Sabrina Harding  MRN:  [id number] Subjective: Patient states "I was just overwhelmed. The problems with my kids just got to me. I feel fine today. I am just worried about my job status. I have a good support system."  Objective:  History per the Psychiatric Admission Assessment Adult 09/17/2020:  Sabrina Harding is a 40 yr old female who presents under IVC after attempted suicide by holding a knife to her throat. 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   She reports that she normal can handle the things that life throws at her but when it comes to her kids this is the trigger for her. She reports that while she was at work she received the news that her kids would be changing their last names. She reports she tried to keep working but one of her managers realized something was wrong. They went out to her car to smoke and she reports this allowed her to refocus enough to finish her work. She reports she went home and planned to spend the evening using her coping skills. She put on music and planned to re after eating a burger and drinking a mixed drink. Her friend then video chatted her. The friend noticed that something was off and so insisted on staying on the line with her all night. She then went to sleep. However, she had written letters and sent them to another friend with instructions to send them to her kids if something happened to her. When she went to sleep she reported that she did not want to wake up.   The next morning she heard her mom trying to get into the room and her father was constantly calling her. She states she just decided that was it. She barricaded the door and grabbed a knife. She reports that her father is her hero. When she answer the phone from him he said "You can't do this you're my daughter." At that point she realized she did not want to go through with it and dropped the knife she let the police come in  through the window and they talked with her. When they asked her to come to the hospital she agreed.   She reports that her mother has been telling her she needs to go to therapy. She reports that she has always had a strong support network, she reports that she can always call her mother, sister, or work friend. But she does feel like sometimes she is being a burden to these people. She is open to seeing a therapist outpatient.   She initially did not want the Zoloft as she had a bad response to the medications from her only other hospitalization. When asked she reports taking Ambien and Seroquel and she did not like the way it made her fell. She reports feeling like a zombie and not feeling alive so she stopped taking it. Discussed that SSRI's rarely have a numbing effect like she experienced. She eventually did decide to continue with it for now.   She reports she uses no illicit substances. She reports she drinks no EtOH. She does smoke cigarettes. She reports no SI, HI, or AVH. She has no other concerns at present.   Per assessment 09/18/2020:   Patient is seen and chart is reviewed. Patient is interviewed in her room in the morning. She continues to appear to minimize events leading up to her admission. She does not have any  mood concerns today. Her main concern is her discharge date. Per notes patient has so far been compliant with her zoloft was initiated. She is open to working with a therapist to improve her coping abilities after discharge. Patient admits that the stressors will remain constant after her discharge. At this time patient is not able to say if anything is has changed since her suicide attempt. She continues to say that her parents are a very good support system. The patient appears flat and guarded during the interview. Patient was encouraged to attend groups and remain compliant with recommended treatments.      Principal Problem: MDD (major depressive disorder) Diagnosis:  Principal Problem:   MDD (major depressive disorder)  Total Time spent with patient: 20 minutes  Past Psychiatric History: See H & P  Past Medical History:  Past Medical History:  Diagnosis Date   Anemia    Gall stones    Gonorrhea    Headache(784.0)    Heart murmur    childhood   Trichomonas contact     Past Surgical History:  Procedure Laterality Date   CHOLECYSTECTOMY  10/17/2011   Procedure: LAPAROSCOPIC CHOLECYSTECTOMY;  Surgeon: Harl Bowie, MD;  Location: [facility] OR;  Service: General;  Laterality: N/A;   TONSILLECTOMY     TUBAL LIGATION     Family History:  Family History  Problem Relation Age of Onset   Hypertension Father    Healthy Mother    Family Psychiatric  History: See H & P Social History:  Social History   Substance and Sexual Activity  Alcohol Use Yes   Alcohol/week: 0.0 standard drinks   Comment: occasional     Social History   Substance and Sexual Activity  Drug Use No    Social History   Socioeconomic History   Marital status: Single    Spouse name: Not on file   Number of children: 3   Years of education: Not on file   Highest education level: Not on file  Occupational History   Not on file  Tobacco Use   Smoking status: Every Day    Packs/day: 1.00    Years: 8.00    Pack years: 8.00    Types: Cigarettes   Smokeless tobacco: Never  Vaping Use   Vaping Use: Never used  Substance and Sexual Activity   Alcohol use: Yes    Alcohol/week: 0.0 standard drinks    Comment: occasional   Drug use: No   Sexual activity: Yes    Partners: Male    Birth control/protection: Surgical    Comment: Reports having unprotected sex  Other Topics Concern   Not on file  Social History Narrative   Lives in [place] at home with her three children and mother. 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   Social Determinants of Health   Financial Resource Strain: Not on file  Food Insecurity: Not on file  Transportation Needs: Not on file   Physical Activity: Not on file  Stress: Not on file  Social Connections: Not on file   Additional Social History:                         Sleep: Fair  Appetite:  Good  Current Medications: Current Facility-Administered Medications  Medication Dose Route Frequency Provider Last Rate Last Admin   sertraline (ZOLOFT) tablet 50 mg  50 mg Oral Daily Leevy-Johnson, Brooke A, NP   50 mg at 09/18/20 [phone]  Lab Results: No results found for this or any previous visit (from the past 48 hour(s)).  Blood Alcohol level:  Lab Results  Component Value Date   ETH <10 85/46/2703    Metabolic Disorder Labs: No results found for: HGBA1C, MPG Lab Results  Component Value Date   PROLACTIN 6.8 09/15/2015   No results found for: CHOL, TRIG, HDL, CHOLHDL, VLDL, LDLCALC  Physical Findings: AIMS:  , ,  ,  ,    CIWA:    COWS:     Musculoskeletal: Strength & Muscle Tone: within normal limits Gait & Station: normal Patient leans: N/A  Psychiatric Specialty Exam:  Presentation  General Appearance: Appropriate for Environment; Casual  Eye Contact:Good  Speech:Clear and Coherent; Normal Rate  Speech Volume:Normal  Handedness: No data recorded  Mood and Affect  Mood:Dysphoric  Affect: Flat  Thought Process  Thought Processes:Coherent; Goal Directed  Descriptions of Associations:Intact  Orientation:Full (Time, Place and Person)  Thought Content:WDL  History of Schizophrenia/Schizoaffective disorder:No  Duration of Psychotic Symptoms:No data recorded Hallucinations:Hallucinations: None  Ideas of Reference:None  Suicidal Thoughts:Suicidal Thoughts: No  Homicidal Thoughts:Homicidal Thoughts: No   Sensorium  Memory:Immediate Fair; Recent Fair  Judgment:Fair  Insight:Fair   Executive Functions  Concentration:Good  Attention Span:Good  Vance of Knowledge:Good  Language:Good   Psychomotor Activity  Psychomotor  Activity:Psychomotor Activity: Normal   Assets  Assets:Desire for Improvement; Resilience; Social Support   Sleep  Sleep:Sleep: Good    Physical Exam: Physical Exam ROS Blood pressure (!) 117/96, pulse 78, temperature 98.1 F (36.7 C), temperature source Oral, resp. rate 16, height 5\' 5"  (1.651 m), weight 118.4 kg, last menstrual period 09/01/2012, SpO2 93 %. Body mass index is 43.43 kg/m.   Treatment Plan Summary: Daily contact with patient to assess and evaluate symptoms and progress in treatment and Medication management   Continue zoloft 50 mg po daily for depression   Treatment Plan/Recommendations:   1. Admit for crisis management and stabilization. Estimated length of stay 5-7 days. 2. Medication management to reduce current symptoms to base line and improve the patient's level of functioning. Continue on Zoloft 50 mg po daily for depressive and anxious symptoms.  3. Develop treatment plan to decrease risk of relapse upon discharge of depressive symptoms and the need for readmission. 5. Group therapy to facilitate development of healthy coping skills to use for depression and anxiety. 6. Health care follow up as needed for medical problems.  7. Discharge plan to include therapy to help patient cope with death of mother and other stressors.  8. Call for Consult with Hospitalist for additional specialty patient services as needed.      Elmarie Shiley, NP 09/18/2020, 11:42 AM

## 2020-09-18 NOTE — Progress Notes (Signed)
   09/18/20 0934  Vital Signs  Pulse Rate 78  BP (!) 117/96  BP Location Right Arm  BP Method Automatic  Patient Position (if appropriate) Lying   D: Patient denies SI/HI/AVH. Pt. Denies both anxiety and depression. Patient isolative. A:  Patient took scheduled medicine.  Support and encouragement provided Routine safety checks conducted every 15 minutes. Patient  Informed to notify staff with any concerns.   R:  Safety maintained.

## 2020-09-18 NOTE — Progress Notes (Signed)
The patient's positive event for the day is that she had a good talk with her peers. She also mentioned that she enjoyed the grief and loss group earlier today. Her goal for tomorrow is to be a good friend to one of her peers.

## 2020-09-18 NOTE — Tx Team (Addendum)
Interdisciplinary Treatment and Diagnostic Plan Update  09/18/2020 Time of Session: 1:15PM Sabrina Harding MRN: [id number]  Principal Diagnosis: MDD (major depressive disorder)  Secondary Diagnoses: Principal Problem:   MDD (major depressive disorder)   Current Medications:  Current Facility-Administered Medications  Medication Dose Route Frequency Provider Last Rate Last Admin   sertraline (ZOLOFT) tablet 50 mg  50 mg Oral Daily Leevy-Johnson, Brooke A, NP   50 mg at 09/18/20 6759   PTA Medications: Medications Prior to Admission  Medication Sig Dispense Refill Last Dose   acetaminophen (TYLENOL) 325 MG tablet Take 2 tablets (650 mg total) by mouth every 6 (six) hours as needed. (Patient not taking: No sig reported) 30 tablet 0 Not Taking   cephALEXin (KEFLEX) 500 MG capsule Take 1 capsule (500 mg total) by mouth 3 (three) times daily. (Patient not taking: No sig reported) 30 capsule 0 Not Taking   ondansetron (ZOFRAN ODT) 4 MG disintegrating tablet Take 1 tablet (4 mg total) by mouth every 8 (eight) hours as needed for nausea or vomiting. (Patient not taking: No sig reported) 20 tablet 0 Not Taking   prochlorperazine (COMPAZINE) 10 MG tablet Take 1 tablet (10 mg total) by mouth every 6 (six) hours as needed for nausea or vomiting (or headache). (Patient not taking: No sig reported) 20 tablet 0 Not Taking    Patient Stressors: Other: Only stressors are if it is things involving her kids.  Patient Strengths: Ability for insight Average or above average intelligence Communication skills General fund of knowledge Supportive family/friends  Treatment Modalities: Medication Management, Group therapy, Case management,  1 to 1 session with clinician, Psychoeducation, Recreational therapy.   Physician Treatment Plan for Primary Diagnosis: MDD (major depressive disorder) Long Term Goal(s): Improvement in symptoms so as ready for discharge   Short Term Goals: Ability to identify  changes in lifestyle to reduce recurrence of condition will improve Ability to verbalize feelings will improve Ability to disclose and discuss suicidal ideas Ability to identify and develop effective coping behaviors will improve  Medication Management: Evaluate patient's response, side effects, and tolerance of medication regimen.  Therapeutic Interventions: 1 to 1 sessions, Unit Group sessions and Medication administration.  Evaluation of Outcomes: Not Met  Physician Treatment Plan for Secondary Diagnosis: Principal Problem:   MDD (major depressive disorder)  Long Term Goal(s): Improvement in symptoms so as ready for discharge   Short Term Goals: Ability to identify changes in lifestyle to reduce recurrence of condition will improve Ability to verbalize feelings will improve Ability to disclose and discuss suicidal ideas Ability to identify and develop effective coping behaviors will improve     Medication Management: Evaluate patient's response, side effects, and tolerance of medication regimen.  Therapeutic Interventions: 1 to 1 sessions, Unit Group sessions and Medication administration.  Evaluation of Outcomes: Not Met   RN Treatment Plan for Primary Diagnosis: MDD (major depressive disorder) Long Term Goal(s): Knowledge of disease and therapeutic regimen to maintain health will improve  Short Term Goals: Ability to remain free from injury will improve, Ability to verbalize frustration and anger appropriately will improve, Ability to identify and develop effective coping behaviors will improve, and Compliance with prescribed medications will improve  Medication Management: RN will administer medications as ordered by provider, will assess and evaluate patient's response and provide education to patient for prescribed medication. RN will report any adverse and/or side effects to prescribing provider.  Therapeutic Interventions: 1 on 1 counseling sessions, Psychoeducation,  Medication administration, Evaluate responses to treatment, Monitor  vital signs and CBGs as ordered, Perform/monitor CIWA, COWS, AIMS and Fall Risk screenings as ordered, Perform wound care treatments as ordered.  Evaluation of Outcomes: Not Met   LCSW Treatment Plan for Primary Diagnosis: MDD (major depressive disorder) Long Term Goal(s): Safe transition to appropriate next level of care at discharge, Engage patient in therapeutic group addressing interpersonal concerns.  Short Term Goals: Engage patient in aftercare planning with referrals and resources, Increase social support, Increase ability to appropriately verbalize feelings, Identify triggers associated with mental health/substance abuse issues, and Increase skills for wellness and recovery  Therapeutic Interventions: Assess for all discharge needs, 1 to 1 time with Social worker, Explore available resources and support systems, Assess for adequacy in community support network, Educate family and significant other(s) on suicide prevention, Complete Psychosocial Assessment, Interpersonal group therapy.  Evaluation of Outcomes: Not Met   Progress in Treatment: Attending groups: No. Participating in groups: No. Taking medication as prescribed: Yes. Toleration medication: Yes. Family/Significant other contact made: No, will contact:  mother Patient understands diagnosis: Yes. Discussing patient identified problems/goals with staff: Yes. Medical problems stabilized or resolved: Yes. Denies suicidal/homicidal ideation: Yes. Issues/concerns per patient self-inventory: No.   New problem(s) identified: No, Describe:  none  New Short Term/Long Term Goal(s): medication stabilization, elimination of SI thoughts, development of comprehensive mental wellness plan.    Patient Goals:  Did not attend  Discharge Plan or Barriers: Patient recently admitted. CSW will continue to follow and assess for appropriate referrals and possible  discharge planning.    Reason for Continuation of Hospitalization: Anxiety Depression Medication stabilization Suicidal ideation  Estimated Length of Stay: 3-5 days  Attendees: Patient: Did not attend 09/18/2020   Physician: Ethelene Browns, MD 09/18/2020   Nursing:  09/18/2020   RN Care Manager: 09/18/2020   Social Worker: Darletta Moll 09/18/2020   Recreational Therapist:  09/18/2020   Other:  09/18/2020   Other:  09/18/2020   Other: 09/18/2020     Scribe for Treatment Team: Vassie Moselle, LCSW 09/18/2020 2:45 PM

## 2020-09-18 NOTE — [facility] Group Notes (Signed)
Occupational Therapy Group Note Date: 09/18/2020 Group Topic/Focus: Strengths Exploration  Group Description: Group encouraged increased participation and engagement through discussion focused on STRENGTHS. Patients were encouraged to fill out a worksheet to structure discussion, that included identifying strengths as it relates to one's relationships, profession, and personal fulfillment. Discussion followed with patients sharing their responses and highlighting their own personal strengths.  Therapeutic Goals: Identify strengths vs weaknesses Discuss and identify ways we can highlight our strengths  Participation Level: Active   Participation Quality: Independent   Behavior: Cooperative   Speech/Thought Process: Focused   Affect/Mood: Euthymic   Insight: Fair   Judgement: Fair   Individualization: Sabrina Harding was moderately engaged in their participation of group discussion/activity. Pt identified "open-mindedness" as her strengths and shared that she finds strength "in my children and my dad. My dad is my best friend". Appeared open and receptive to ongoing group discussion.  Modes of Intervention: Activity, Discussion, Education, and Support  Patient Response to Interventions:  Attentive, Engaged, Receptive, and Interested   Plan: Continue to engage patient in OT groups 2 - 3x/week.  09/18/2020  Ponciano Ort, MOT, OTR/L

## 2020-09-18 NOTE — [facility] Suicide Risk Assessment (Signed)
[place] INPATIENT:  Family/Significant Other Suicide Prevention Education  Suicide Prevention Education:  Education Completed; Sabrina Harding, Mother, [phone],  (name of family member/significant other) has been identified by the patient as the family member/significant other with whom the patient will be residing, and identified as the person(s) who will aid the patient in the event of a mental health crisis (suicidal ideations/suicide attempt).  With written consent from the patient, the family member/significant other has been provided the following suicide prevention education, prior to the and/or following the discharge of the patient.  The suicide prevention education provided includes the following: Suicide risk factors Suicide prevention and interventions National Suicide Hotline telephone number [facility] assessment telephone number [facility] [place] and/or Residential Mobile Crisis Unit telephone number  Request made of family/significant other to: Remove weapons (e.g., guns, rifles, knives), all items previously/currently identified as safety concern.   Remove drugs/medications (over-the-counter, prescriptions, illicit drugs), all items previously/currently identified as a safety concern.  The family member/significant other verbalizes understanding of the suicide prevention education information provided.  The family member/significant other agrees to remove the items of safety concern listed above.  CSW connected with mother regarding any concerns surrounding discharge and patient safety. Mother expressed concerns surrounding pt living alone and having increased work stressors. Mother detailed pt's father and stepmother considering pt to stay with them for a brief period following discharge. Mother detailed conversations with pt and pt acknowledging the need to continue medication management and therapy following  discharge.  [name] 09/18/2020, 4:42 PM

## 2020-09-18 NOTE — [facility] Group Notes (Signed)
Patient did not attend morning gaol and activity group
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
## 2020-09-19 LAB — TSH: TSH: 1.4 u[IU]/mL (ref 0.350–4.500)

## 2020-09-19 MED ORDER — NICOTINE POLACRILEX 2 MG MT GUM
2.0000 mg | CHEWING_GUM | OROMUCOSAL | Status: DC | PRN
  Administered 2020-09-19: 2 mg via ORAL
  Filled 2020-09-19: qty 1

## 2020-09-19 MED ORDER — HYDROXYZINE HCL 25 MG PO TABS
25.0000 mg | ORAL_TABLET | Freq: Three times a day (TID) | ORAL | Status: DC | PRN
  Filled 2020-09-19: qty 1

## 2020-09-19 MED ORDER — TRAZODONE HCL 50 MG PO TABS: 50.0000 mg | ORAL_TABLET | Freq: Every evening | ORAL | Status: DC | PRN

## 2020-09-19 MED ORDER — CLONIDINE HCL 0.1 MG PO TABS: 0.1000 mg | ORAL_TABLET | Freq: Three times a day (TID) | ORAL | Status: DC | PRN

## 2020-09-19 NOTE — Progress Notes (Addendum)
Recreation Therapy Notes  Animal-Assisted Activity (AAA) Program Checklist/Progress Notes Patient Eligibility Criteria Checklist & Daily Group note for Rec Tx Intervention  Date: 6.14.22 Time: 29 Location: [address]   AAA/T Program Assumption of Risk Form signed by Teacher, music or Parent Legal Guardian YES  Patient is free of allergies or severe asthma YES   Patient reports no fear of animals YES   Patient reports no history of cruelty to animals YES  Patient understands his/her participation is voluntary YES  Patient washes hands before animal contact YES  Patient washes hands after animal contact YES   Education: Contractor, Appropriate Animal Interaction   Education Outcome: Acknowledges understanding/In group clarification offered/Needs additional education.   Clinical Observations/Feedback: Pt did not attend group activity.   Victorino Sparrow, LRT/CTRS        Victorino Sparrow A 09/19/2020 3:29 PM
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
## 2020-09-19 NOTE — Progress Notes (Signed)
   09/18/20 0000  Psych Admission Type (Psych Patients Only)  Admission Status Involuntary  Psychosocial Assessment  Patient Complaints None  Eye Contact Fair  Facial Expression Worried  Affect Anxious;Depressed  Speech Logical/coherent  Interaction Assertive  Motor Activity Other (Comment) (WDL)  Appearance/Hygiene In scrubs  Behavior Characteristics Appropriate to situation  Mood Depressed  Thought Process  Coherency WDL  Content Blaming self  Delusions WDL  Perception WDL  Hallucination None reported or observed  Judgment Poor  Confusion WDL  Danger to Self  Current suicidal ideation? Denies  Self-Injurious Behavior No self-injurious ideation or behavior indicators observed or expressed   Agreement Not to Harm Self Yes  Description of Agreement verbal contract for safety  Danger to Others  Danger to Others None reported or observed

## 2020-09-19 NOTE — Progress Notes (Signed)
Pt has been reclusive to her room. She denied SI/HI/AVH or self harm thoughts. She stated that she was sleepy and denied any other concerns/discomfort. No falls or unsafe behavior noted thus far. Q15 min observations maintained for safety and support provided as needed.   09/19/20 2300  Psych Admission Type (Psych Patients Only)  Admission Status Involuntary  Psychosocial Assessment  Patient Complaints None  Eye Contact Brief  Facial Expression Sad  Affect Sad  Speech Logical/coherent  Interaction Assertive  Motor Activity Other (Comment) (Unremarkable)  Appearance/Hygiene In scrubs  Behavior Characteristics Cooperative;Calm  Mood Other (Comment) ("AM good")  Thought Process  Coherency WDL  Content WDL  Delusions None reported or observed  Perception WDL  Hallucination None reported or observed  Judgment Poor  Confusion None  Danger to Self  Current suicidal ideation? Denies  Self-Injurious Behavior No self-injurious ideation or behavior indicators observed or expressed   Agreement Not to Harm Self Yes  Description of Agreement verbal contract for safety  Danger to Others  Danger to Others None reported or observed

## 2020-09-19 NOTE — Progress Notes (Signed)
Adult Psychoeducational Group Note  Date:  09/19/2020 Time:  4:47 PM  Group Topic/Focus:  Healthy Communication:   The focus of this group is to discuss communication, barriers to communication, as well as healthy ways to communicate with others.  Participation Level:  Active  Participation Quality:  Appropriate and Attentive  Affect:  Appropriate  Cognitive:  Alert and Appropriate  Insight: Appropriate and Good  Engagement in Group:  Engaged  Modes of Intervention:  Discussion  Additional Comments:  Pt attended and participated in the second group of the day on healthy communication.   Sabrina Harding 09/19/2020, 4:47 PM

## 2020-09-19 NOTE — Progress Notes (Signed)
Adult Psychoeducational Group Note  Date:  09/19/2020 Time:  2:30 PM  Group Topic/Focus:  Goals Group:   The focus of this group is to help patients establish daily goals to achieve during treatment and discuss how the patient can incorporate goal setting into their daily lives to aide in recovery.  Participation Level:  Active  Participation Quality:  Appropriate and Attentive  Affect:  Appropriate  Cognitive:  Alert and Appropriate  Insight: Appropriate and Good  Engagement in Group:  Engaged  Modes of Intervention:  Discussion  Additional Comments:  Pt has a goal of focusing on the positives in her life as well on using all of her coping skills.   Tyrell Antonio Sabrina Harding 09/19/2020, 2:30 PM

## 2020-09-19 NOTE — [facility] Group Notes (Signed)
ADULT GRIEF GROUP NOTE:   Spiritual care group on grief and loss facilitated by chaplain Janne Napoleon, [facility]   Group Goal:   Support / Education around grief and loss   Members engage in facilitated group support and psycho-social education.   Group Description:   Following introductions and group rules, group members engaged in facilitated group dialog and support around topic of loss, with particular support around experiences of loss in their lives. Group Identified types of loss (relationships / self / things) and identified patterns, circumstances, and changes that precipitate losses. Reflected on thoughts / feelings around loss, normalized grief responses, and recognized variety in grief experience. Group noted Worden's four tasks of grief in discussion.   Group drew on Adlerian / Rogerian, narrative, MI,   Patient Progress: Sabrina Harding was an active participant in the group. She shared personally about some losses in her own life and was able to share some insight into the grief process with her peers.  At times she dominated the group and was very focused on helping one of her peers through a difficult time.  I redirected the conversation and she was able to participate appropriately.  Wise, Bcc Pager, [phone] 10:10 AM

## 2020-09-20 MED ORDER — WHITE PETROLATUM EX OINT
TOPICAL_OINTMENT | CUTANEOUS | Status: AC
  Filled 2020-09-20: qty 5

## 2020-09-20 MED ORDER — OXCARBAZEPINE 300 MG PO TABS
300.0000 mg | ORAL_TABLET | Freq: Every day | ORAL | Status: DC
  Administered 2020-09-20 – 2020-09-21 (×2): 300 mg via ORAL
  Filled 2020-09-20 (×3): qty 1
  Filled 2020-09-20: qty 7
  Filled 2020-09-20: qty 1
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
## 2020-09-20 NOTE — Progress Notes (Signed)
Adult Psychoeducational Group Note  Date:  09/20/2020 Time:  10:16 AM  Group Topic/Focus:  Goals Group:   The focus of this group is to help patients establish daily goals to achieve during treatment and discuss how the patient can incorporate goal setting into their daily lives to aide in recovery.  Participation Level:  Active  Participation Quality:  Appropriate and Attentive  Affect:  Appropriate  Cognitive:  Alert and Appropriate  Insight: Appropriate and Good  Engagement in Group:  Engaged  Modes of Intervention:  Discussion  Additional Comments:  Pt has a goal of making time for herself everyday. Pt said she has learned to accept and forgive herself and has a changed perspective of herself since admission.   Sabrina Harding 09/20/2020, 10:16 AM

## 2020-09-20 NOTE — Progress Notes (Signed)
Recreation Therapy Notes  Date: 6.15.22 Time: 0930 Location: [address] Dayroom  Group Topic: Stress Management   Goal Area(s) Addresses:  Patient will actively participate in stress management techniques presented during session.  Patient will successfully identify benefit of practicing stress management post d/c.   Behavioral Response: Appropriate  Intervention: Guided exercise with ambient sound and script  Activity :Guided Imagery  LRT provided education, instruction, and demonstration on practice of visualization via guided imagery. Patient was asked to participate in the technique introduced during session. LRT also debriefed including topics of mindfulness, stress management and specific scenarios each patient could use these techniques. Patients were given suggestions of ways to access scripts post d/c and encouraged to explore Youtube and other apps available on smartphones, tablets, and computers.   Education:  Stress Management, Discharge Planning.   Education Outcome: Acknowledges education  Clinical Observations/Feedback: Patient actively engaged in technique introduced, expressed no concerns.  Pt talked about how she goes on Youtube to find rain sounds and mixes that with other sounds to create a relaxing environment.  Pt stated she also lights candles to help set the atmosphere.  Pt also expounded on how it's easy for her to block people even in areas where people are talking to meditate.         Victorino Sparrow, LRT/CTRS     Victorino Sparrow A 09/20/2020 11:23 AM

## 2020-09-20 NOTE — Plan of Care (Signed)
  Problem: Education: Goal: Emotional status will improve Outcome: Progressing Goal: Mental status will improve Outcome: Progressing   Problem: Education: Goal: Mental status will improve Outcome: Progressing

## 2020-09-20 NOTE — Progress Notes (Signed)
Patient arrived late for group and appeared to be upset. She would only share with the group that she intends to focus more on herself.

## 2020-09-20 NOTE — Progress Notes (Signed)
D- Patient alert and oriented. Patient affect/mood stable.  Denies SI, HI, AVH, and pain.   A- Scheduled medications administered to patient, per MD orders. Support and encouragement provided.  Routine safety checks conducted every 15 minutes.  Patient informed to notify staff with problems or concerns.  R- No adverse drug reactions noted. Patient contracts for safety at this time. Patient compliant with medications and treatment plan. Patient receptive, calm, and cooperative. Patient interacts well with others on the unit.  Patient remains safe at this time.             [place] NOVEL CORONAVIRUS (COVID-19) DAILY CHECK-OFF SYMPTOMS - answer yes or no to each - every day NO YES  Have you had a fever in the past 24 hours?  Fever (Temp > 37.80C / 100F) X    Have you had any of these symptoms in the past 24 hours? New Cough  Sore Throat   Shortness of Breath  Difficulty Breathing  Unexplained Body Aches   X    Have you had any one of these symptoms in the past 24 hours not related to allergies?   Runny Nose  Nasal Congestion  Sneezing   X    If you have had runny nose, nasal congestion, sneezing in the past 24 hours, has it worsened?   X    EXPOSURES - check yes or no X    Have you traveled outside the state in the past 14 days?   X    Have you been in contact with someone with a confirmed diagnosis of COVID-19 or PUI in the past 14 days without wearing appropriate PPE?   X    Have you been living in the same home as a person with confirmed diagnosis of COVID-19 or a PUI (household contact)?     X    Have you been diagnosed with COVID-19?     X                                                                                                                             What to do next: Answered NO to all: Answered YES to anything:    Proceed with unit schedule Follow the [facility] Inpatient Flowsheet.

## 2020-09-21 MED ORDER — OXCARBAZEPINE 300 MG PO TABS: 300.0000 mg | ORAL_TABLET | Freq: Every day | ORAL | 0 refills | Status: DC

## 2020-09-21 MED ORDER — WHITE PETROLATUM EX OINT
TOPICAL_OINTMENT | CUTANEOUS | Status: AC
  Filled 2020-09-21: qty 5

## 2020-09-21 MED ORDER — HYDROXYZINE HCL 25 MG PO TABS: 25.0000 mg | ORAL_TABLET | Freq: Three times a day (TID) | ORAL | 0 refills | Status: DC | PRN

## 2020-09-21 MED ORDER — SERTRALINE HCL 50 MG PO TABS: 50.0000 mg | ORAL_TABLET | Freq: Every day | ORAL | 0 refills | Status: DC

## 2020-09-21 NOTE — [facility] Suicide Risk Assessment (Signed)
[facility] Discharge Suicide Risk Assessment   Principal Problem: MDD (major depressive disorder) Discharge Diagnoses: Principal Problem:   MDD (major depressive disorder)   Total Time spent with patient: 20 minutes  Musculoskeletal: Strength & Muscle Tone: within normal limits Gait & Station: normal Patient leans: N/A  Psychiatric Specialty Exam  Presentation  General Appearance: Appropriate for Environment  Eye Contact:Good  Speech:Normal Rate  Speech Volume:Normal  Handedness:Right   Mood and Affect  Mood:Anxious  Duration of Depression Symptoms: Greater than two weeks  Affect:Congruent   Thought Process  Thought Processes:Coherent  Descriptions of Associations:Intact  Orientation:Full (Time, Place and Person)  Thought Content:Logical  History of Schizophrenia/Schizoaffective disorder:No  Duration of Psychotic Symptoms:No data recorded Hallucinations:Hallucinations: None  Ideas of Reference:None  Suicidal Thoughts:Suicidal Thoughts: No  Homicidal Thoughts:Homicidal Thoughts: No   Sensorium  Memory:Immediate Good; Recent Good; Remote Good  Judgment:Fair  Insight:Good   Executive Functions  Concentration:Good  Attention Span:Fair  [place] of Knowledge:Good  Language:Good   Psychomotor Activity  Psychomotor Activity:Psychomotor Activity: Increased   Assets  Assets:Desire for Improvement; Resilience; Social Support; Housing; Vocational/Educational   Sleep  Sleep:Sleep: Good Number of Hours of Sleep: 6.5   Physical Exam: Physical Exam Vitals and nursing note reviewed.  Constitutional:      Appearance: Normal appearance.  HENT:     Head: Normocephalic and atraumatic.  Pulmonary:     Effort: Pulmonary effort is normal.  Neurological:     General: No focal deficit present.     Mental Status: She is alert and oriented to person, place, and time.   Review of Systems  All other systems reviewed and are  negative. Blood pressure 119/77, pulse 77, temperature 98.3 F (36.8 C), temperature source Oral, resp. rate 18, height 5\' 5"  (1.651 m), weight 118.4 kg, last menstrual period 09/01/2012, SpO2 99 %. Body mass index is 43.43 kg/m.  Mental Status Per Nursing Assessment::   On Admission:  Suicidal ideation indicated by patient, Suicide plan, Intention to act on suicide plan  Demographic Factors:  Low socioeconomic status and Living alone  Loss Factors: Loss of significant relationship  Historical Factors: Impulsivity  Risk Reduction Factors:   Employed and Positive social support  Continued Clinical Symptoms:  Depression:   Impulsivity  Cognitive Features That Contribute To Risk:  None    Suicide Risk:  Minimal: No identifiable suicidal ideation.  Patients presenting with no risk factors but with morbid ruminations; may be classified as minimal risk based on the severity of the depressive symptoms   Follow-up [facility] up.   Specialty: Behavioral Health Why: You may go to this provider for therapy and medication management services.  Please go during walk in hours of Monday through Wednesday from 8:00 am to 11:00 am.   Services are first come, first served. Contact information: [place] [phone]                Plan Of Care/Follow-up recommendations:  Activity:  ad lib  Sharma Covert, MD 09/21/2020, 11:35 AM

## 2020-09-21 NOTE — [facility]HH Group Notes (Signed)
[facility] Group Notes:  (Nursing/MHT/Case Management/Adjunct)  Date:  09/21/2020  Time:  10:27 AM  Type of Therapy:   Goals group  Participation Level:  Active  Participation Quality:  Appropriate and Sharing  Affect:  Depressed  Cognitive:  Alert and Appropriate  Insight:  Improving  Engagement in Group:  Engaged  Modes of Intervention:  Discussion and Education  Summary of Progress/Problems:  She reported her goal for today is "go home and I am fine with my medications."  Windell Moment 09/21/2020, 10:27 AM

## 2020-09-21 NOTE — Progress Notes (Signed)
D:  Patient's self inventory sheet, patient sleeps good, no sleep medication.  Good appetite, normal energy level, good concentration.  Denied depression, hopeless and anxiety.  Denied withdrawals.  Denied SI.  Denied physical problems.  Denied physical pain.  Goal is discharge.  Make sure I have counseling after discharge.  Does have discharge plans. A:  Medications administered per MD orders.  Emotional support and encouragement given patient. R:  Denied SI and HI, contracts for safety.  Denied A/V hallucinations.  Safety maintained with 15 minute checks.

## 2020-09-21 NOTE — Discharge Summary (Signed)
Physician Discharge Summary Note  Patient:  Sabrina Harding is an 40 y.o., female MRN:  [id number] DOB:  15-Jul-1980 Patient phone:  [phone] (home)  Patient address:   [address] [place] [address],  Total Time spent with patient: 30 minutes  Date of Admission:  09/17/2020 Date of Discharge: 09/21/2020  Reason for Admission:  Sabrina Harding is a 40 year old female who was admitted to the [facility] after she was holding a knife to her throat and threatened suicide. Prior to this incident patient received the news that her children were going to change their last names.   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, hospitalized in 2003"  Past Medical History:  Past Medical History:  Diagnosis Date   Anemia    Gall stones    Gonorrhea    Headache(784.0)    Heart murmur    childhood   Trichomonas contact     Past Surgical History:  Procedure Laterality Date   CHOLECYSTECTOMY  10/17/2011   Procedure: LAPAROSCOPIC CHOLECYSTECTOMY;  Surgeon: Harl Bowie, MD;  Location: [place];  Service: General;  Laterality: N/A;   TONSILLECTOMY     TUBAL LIGATION     Family History:  Family History  Problem Relation Age of Onset   Hypertension Father    Healthy Mother    Family Psychiatric  History: Per H&P: Administrator, Civil Service Mother: Schizoaffective"  Social History:  Social History   Substance and Sexual Activity  Alcohol Use Yes   Alcohol/week: 0.0 standard drinks   Comment: occasional     Social History   Substance and Sexual Activity  Drug Use No    Social History   Socioeconomic History   Marital status: Single    Spouse name: Not on file   Number of children: 3   Years of education: Not on file   Highest education level: Not on file  Occupational History   Not on file   Tobacco Use   Smoking status: Every Day    Packs/day: 1.00    Years: 8.00    Pack years: 8.00    Types: Cigarettes   Smokeless tobacco: Never  Vaping Use   Vaping Use: Never used  Substance and Sexual Activity   Alcohol use: Yes    Alcohol/week: 0.0 standard drinks    Comment: occasional   Drug use: No   Sexual activity: Yes    Partners: Male    Birth control/protection: Surgical    Comment: Reports having unprotected sex  Other Topics Concern   Not on file  Social History Narrative   Lives in [place] at home with her three children and mother. 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   Social Determinants of Health   Financial Resource Strain: Not on file  Food Insecurity: Not on file  Transportation Needs: Not on file  Physical Activity: Not on file  Stress: Not on file  Social Connections: Not on file    Hospital Course:  After the above admission evaluation, patient was admitted to the [facility] for medication management and treatment for her symptoms of anxiety, depression and suicidal ideation. Per patient, she has a history of post partum psychosis with her first child in 2003.  There were no instances of behavior that required restraints or immediate intervention. Patient remained safe on the unit. There were no instances of  self-harming behavior. Patient remained cooperative, participated in group sessions and interacted with staff and patients appropriately.  Trileptal, trazodone, and Zoloft were initiated and patient was discharged with these medications electronically sent to the pharmacy on file.  Over the course of her hospitalization, patient states that her symptoms of anxiety and depression have improved. She denies thoughts of self-harm and suicidal ideation. She expresses that she has learned better coping strategies to alleviate her symptoms. Patient expresses readiness for discharge and future-oriented thinking. Patient encouraged to keep all psychiatric  appointments and continue with follow-up.      Physical Findings: AIMS:  , ,  ,  ,    CIWA:    COWS:     Musculoskeletal: Strength & Muscle Tone: within normal limits Gait & Station: normal Patient leans: N/A   Psychiatric Specialty Exam: See physician's discharge SRA   Physical Exam: Physical Exam Vitals and nursing note reviewed.  HENT:     Head: Normocephalic.     Mouth/Throat:     Pharynx: No oropharyngeal exudate or posterior oropharyngeal erythema.  Pulmonary:     Effort: Pulmonary effort is normal.  Musculoskeletal:        General: Normal range of motion.     Cervical back: Normal range of motion.  Neurological:     Mental Status: She is alert.   Review of Systems  Psychiatric/Behavioral:  Positive for depression (Hx of. Stable for discharge). Negative for hallucinations, memory loss, substance abuse and suicidal ideas. The patient is not nervous/anxious and does not have insomnia.   All other systems reviewed and are negative. Blood pressure 119/77, pulse 77, temperature 98.3 F (36.8 C), temperature source Oral, resp. rate 18, height 5\' 5"  (1.651 m), weight 118.4 kg, last menstrual period 09/01/2012, SpO2 99 %. Body mass index is 43.43 kg/m.   Social History   Tobacco Use  Smoking Status Every Day   Packs/day: 1.00   Years: 8.00   Pack years: 8.00   Types: Cigarettes  Smokeless Tobacco Never   Tobacco Cessation:  Prescription not provided because: it is available over the counter   Blood Alcohol level:  Lab Results  Component Value Date   ETH <10 96/29/5284    Metabolic Disorder Labs:  No results found for: HGBA1C, MPG Lab Results  Component Value Date   PROLACTIN 6.8 09/15/2015   No results found for: CHOL, TRIG, HDL, CHOLHDL, VLDL, LDLCALC  See Psychiatric Specialty Exam and Suicide Risk Assessment completed by Attending Physician prior to discharge.  Discharge destination:  Home  Is patient on multiple antipsychotic therapies at  discharge:  No   Has Patient had three or more failed trials of antipsychotic monotherapy by history:  No  Recommended Plan for Multiple Antipsychotic Therapies: NA  Discharge Instructions     Discharge instructions   Complete by: As directed    Discharge Recommendations:  Shayanne Rembert is being discharged to home. You are to take your discharge medications as ordered until you see your outpatient provider, who may make adjustments. See follow up appointments below. Please follow up with your primary care provider for all other medical needs and follow recommended schedule for well exams.   We recommend that you participate in psychological therapy. You should continue to be monitored for recurrent suicidal ideation. If your symptoms worsen or do not continue to improve or if you become actively suicidal or homicidal then it is recommended that you return to the closest hospital emergency room or call 911 for further evaluation  and treatment. National Suicide Prevention Lifeline 1800-SUICIDE or (231) [phone]. You should abstain from all illicit substances and alcohol.   Increase activity slowly   Complete by: As directed       Allergies as of 09/21/2020   No Known Allergies      Medication List     STOP taking these medications    acetaminophen 325 MG tablet Commonly known as: Tylenol   cephALEXin 500 MG capsule Commonly known as: KEFLEX   ondansetron 4 MG disintegrating tablet Commonly known as: Zofran ODT   prochlorperazine 10 MG tablet Commonly known as: COMPAZINE       TAKE these medications      Indication  hydrOXYzine 25 MG tablet Commonly known as: ATARAX/VISTARIL Take 1 tablet (25 mg total) by mouth 3 (three) times daily as needed for anxiety.  Indication: Feeling Anxious   Oxcarbazepine 300 MG tablet Commonly known as: TRILEPTAL Take 1 tablet (300 mg total) by mouth daily. Start taking on: September 22, 2020  Indication: Mood stabilization treatments.    sertraline 50 MG tablet Commonly known as: ZOLOFT Take 1 tablet (50 mg total) by mouth daily. Start taking on: September 22, 2020  Indication: Major Depressive Disorder        Follow-up Information     [facility].   Specialty: Behavioral Health Why: You may go to this provider for therapy and medication management services.  Please go during walk in hours of Monday through Wednesday from 8:00 am to 11:00 am.   Services are first come, first served. Contact information: [place] [phone]                Follow-up recommendations:  Follow up with outpatient provider for all your medical care needs. Activity as tolerated. Diet as recommended by your outpatient provider.   Comments:  Take all of your medications as prescribed   Report any side effects to your outpatient provider promptly.  Refrain from alcohol and illegal drug use while taking medications.  In the case of emergency call 911 or go to the nearest emergency department for evaluation/treatment   Signed: Sherlon Handing, NP, PMHNP-BC 09/21/2020, 7:17 PM

## 2020-09-21 NOTE — Progress Notes (Signed)
Discharge Note:   Patient discharged home with family.  Suicide prevention information given and discussed with patient who stated she understood and had no questions.  Patient stated she received all her belongings, clothing, misc items.   Patient denied SI and HI.  Denied A/V hallucinations.  Patient stated she appreciated all assistance received from [facility]  staff.  All required discharge information given.

## 2020-09-21 NOTE — Progress Notes (Signed)
  [facility] Adult Case Management Discharge Plan :  Will you be returning to the same living situation after discharge:  Yes,  pt to return home. At discharge, do you have transportation home?: Yes,  Mother will tranpsort pt at time of discharge. Do you have the ability to pay for your medications: No. Patient linked to pharmacy resources.  Release of information consent forms completed and turned in to Medical Records by CSW.   Patient to Follow up at:  [facility].   Specialty: Behavioral Health Why: You may go to this provider for therapy and medication management services.  Please go during walk in hours of Monday through Wednesday from 8:00 am to 11:00 am.   Services are first come, first served. Contact information: [place] [phone]                Next level of care provider has access to [place] and Suicide Prevention discussed: Yes,  SPE reviewed with Duncan Dull, Mother, [phone]     Has patient been referred to the Quitline?: Patient refused referral  Patient has been referred for addiction treatment: Yes  [name], LCSW 09/21/2020, 11:41 AM

## 2020-09-21 NOTE — Plan of Care (Signed)
Nurse discussed coping skills with patient.  

## 2020-10-23 ENCOUNTER — Encounter (HOSPITAL_COMMUNITY): Payer: Self-pay | Admitting: Emergency Medicine

## 2020-10-23 ENCOUNTER — Other Ambulatory Visit: Payer: Self-pay

## 2020-10-23 ENCOUNTER — Emergency Department (HOSPITAL_COMMUNITY): Payer: Self-pay

## 2020-10-23 ENCOUNTER — Emergency Department (HOSPITAL_COMMUNITY)
Admission: EM | Admit: 2020-10-23 | Discharge: 2020-10-23 | Disposition: A | Discharge status: Home / Self Care | Payer: Self-pay | Attending: Emergency Medicine | Admitting: Emergency Medicine

## 2020-10-23 DIAGNOSIS — S63502A Unspecified sprain of left wrist, initial encounter: Secondary | ICD-10-CM | POA: Insufficient documentation

## 2020-10-23 DIAGNOSIS — X58XXXA Exposure to other specified factors, initial encounter: Secondary | ICD-10-CM | POA: Insufficient documentation

## 2020-10-23 DIAGNOSIS — M25532 Pain in left wrist: Secondary | ICD-10-CM | POA: Insufficient documentation

## 2020-10-23 DIAGNOSIS — F17210 Nicotine dependence, cigarettes, uncomplicated: Secondary | ICD-10-CM | POA: Insufficient documentation

## 2020-10-23 DIAGNOSIS — Y990 Civilian activity done for income or pay: Secondary | ICD-10-CM | POA: Insufficient documentation

## 2020-10-23 NOTE — ED Provider Notes (Signed)
[facility] Emergency Department Provider Note MRN:  [id number]  Arrival date & time: 10/23/20     Chief Complaint   Wrist Pain   History of Present Illness   Sabrina Harding is a 40 y.o. year-old female with a history of anemia presenting to the ED with chief complaint of wrist pain.  5 days of left wrist pain, seems to be worsening at work.  Does a lot of repetitive motions at work.  Pain is constant and worse with certain motions and positions.  No fever, no trauma, no other complaints.  Pain is moderate.  Review of Systems  A problem-focused ROS was performed. Positive for wrist pain.  Patient denies fever.  Patient's Health History    Past Medical History:  Diagnosis Date   Anemia    Gall stones    Gonorrhea    Headache(784.0)    Heart murmur    childhood   Trichomonas contact     Past Surgical History:  Procedure Laterality Date   CHOLECYSTECTOMY  10/17/2011   Procedure: LAPAROSCOPIC CHOLECYSTECTOMY;  Surgeon: Harl Bowie, MD;  Location: [facility];  Service: General;  Laterality: N/A;   TONSILLECTOMY     TUBAL LIGATION      Family History  Problem Relation Age of Onset   Hypertension Father    Healthy Mother     Social History   Socioeconomic History   Marital status: Single    Spouse name: Not on file   Number of children: 3   Years of education: Not on file   Highest education level: Not on file  Occupational History   Not on file  Tobacco Use   Smoking status: Every Day    Packs/day: 1.00    Years: 8.00    Pack years: 8.00    Types: Cigarettes   Smokeless tobacco: Never  Vaping Use   Vaping Use: Never used  Substance and Sexual Activity   Alcohol use: Yes    Alcohol/week: 0.0 standard drinks    Comment: occasional   Drug use: No   Sexual activity: Yes    Partners: Male    Birth control/protection: Surgical    Comment: Reports having unprotected sex  Other Topics Concern   Not on file  Social History  Narrative   Lives in [place] at home with her three children and mother. 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   Social Determinants of Health   Financial Resource Strain: Not on file  Food Insecurity: Not on file  Transportation Needs: Not on file  Physical Activity: Not on file  Stress: Not on file  Social Connections: Not on file  Intimate Partner Violence: Not on file     Physical Exam   Vitals:   10/23/20 2137  BP: (!) 141/108  Pulse: 99  Resp: 18  Temp: 99.2 F (37.3 C)  SpO2: 98%    CONSTITUTIONAL: Well-appearing, NAD NEURO:  Alert and oriented x 3, no focal deficits EYES:  eyes equal and reactive ENT/NECK:  no LAD, no JVD CARDIO: Regular rate, well-perfused, normal S1 and S2 PULM:  CTAB no wheezing or rhonchi GI/GU:  normal bowel sounds, non-distended, non-tender MSK/SPINE:  No gross deformities, no edema SKIN:  no rash, atraumatic PSYCH:  Appropriate speech and behavior  *Additional and/or pertinent findings included in MDM below  Diagnostic and Interventional Summary    EKG Interpretation  Date/Time:    Ventricular Rate:    PR Interval:    QRS  Duration:   QT Interval:    QTC Calculation:   R Axis:     Text Interpretation:         Labs Reviewed - No data to display  DG Wrist Complete Left  Final Result      Medications - No data to display   Procedures  /  Critical Care Procedures  ED Course and Medical Decision Making  I have reviewed the triage vital signs, the nursing notes, and pertinent available records from the EMR.  Listed above are laboratory and imaging tests that I personally ordered, reviewed, and interpreted and then considered in my medical decision making (see below for details).  Overall a well-appearing patient, wrist is without erythema or increased warmth, there is preserved range of motion, nothing to suggest infection or septic joint.  Seems consistent with overuse injury, possibly carpal tunnel given that pain  and some tingling is reproducible with tapping on the anterior aspect of the wrist.  Appropriate for brace, conservative management, discharge.       Barth Kirks. Sedonia Small, MD [place] [email or web address]  Final Clinical Impressions(s) / ED Diagnoses     ICD-10-CM   1. Sprain of left wrist, initial encounter  [phone]       ED Discharge Orders     None        Discharge Instructions Discussed with and Provided to Patient:    Discharge Instructions      You were evaluated in the Emergency Department and after careful evaluation, we did not find any emergent condition requiring admission or further testing in the hospital.  Your exam/testing today was overall reassuring.  Symptoms seem to be due to carpal tunnel syndrome or some type of overuse injury of the wrist.  Please wear the brace as often as you can including at night as we discussed.  Use Tylenol 1000 mg every 4-6 hours and/or Motrin 600 mg every 4-6 hours for pain.  If pain persists after 2 weeks, may need follow-up with a specialist.  Please return to the Emergency Department if you experience any worsening of your condition.  Thank you for allowing [place] to be a part of your care.        Maudie Flakes, MD 10/23/20 2306

## 2020-10-23 NOTE — ED Triage Notes (Signed)
Patient arrives complaining of non-traumatic right wrist pain. Patient states some finger numbness and a sharp pain that shoots up her arm. Patient states it is swollen, minimal swelling noted.

## 2020-10-23 NOTE — Discharge Instructions (Signed)
You were evaluated in the Emergency Department and after careful evaluation, we did not find any emergent condition requiring admission or further testing in the hospital.  Your exam/testing today was overall reassuring.  Symptoms seem to be due to carpal tunnel syndrome or some type of overuse injury of the wrist.  Please wear the brace as often as you can including at night as we discussed.  Use Tylenol 1000 mg every 4-6 hours and/or Motrin 600 mg every 4-6 hours for pain.  If pain persists after 2 weeks, may need follow-up with a specialist.  Please return to the Emergency Department if you experience any worsening of your condition.  Thank you for allowing [place] to be a part of your care.

## 2021-04-20 ENCOUNTER — Emergency Department (HOSPITAL_COMMUNITY)
Admission: EM | Admit: 2021-04-20 | Discharge: 2021-04-21 | Disposition: A | Discharge status: Home / Self Care | Payer: Medicaid Other | Attending: Emergency Medicine | Admitting: Emergency Medicine

## 2021-04-20 ENCOUNTER — Emergency Department (HOSPITAL_COMMUNITY): Payer: Medicaid Other

## 2021-04-20 ENCOUNTER — Other Ambulatory Visit: Payer: Self-pay

## 2021-04-20 ENCOUNTER — Encounter (HOSPITAL_COMMUNITY): Payer: Self-pay | Admitting: *Deleted

## 2021-04-20 DIAGNOSIS — R6889 Other general symptoms and signs: Secondary | ICD-10-CM

## 2021-04-20 DIAGNOSIS — R059 Cough, unspecified: Secondary | ICD-10-CM | POA: Insufficient documentation

## 2021-04-20 DIAGNOSIS — R0981 Nasal congestion: Secondary | ICD-10-CM | POA: Insufficient documentation

## 2021-04-20 DIAGNOSIS — R109 Unspecified abdominal pain: Secondary | ICD-10-CM

## 2021-04-20 DIAGNOSIS — Z20822 Contact with and (suspected) exposure to covid-19: Secondary | ICD-10-CM | POA: Diagnosis not present

## 2021-04-20 LAB — COMPREHENSIVE METABOLIC PANEL
ALT: 13 U/L (ref 0–44)
AST: 16 U/L (ref 15–41)
Albumin: 3.3 g/dL — ABNORMAL LOW (ref 3.5–5.0)
Alkaline Phosphatase: 84 U/L (ref 38–126)
Anion gap: 8 (ref 5–15)
BUN: <5 mg/dL — ABNORMAL LOW (ref 6–20)
CO2: 28 mmol/L (ref 22–32)
Calcium: 9.1 mg/dL (ref 8.9–10.3)
Chloride: 102 mmol/L (ref 98–111)
Creatinine, Ser: 1 mg/dL (ref 0.44–1.00)
GFR, Estimated: >60 mL/min (ref >60)
Glucose, Bld: 106 mg/dL — ABNORMAL HIGH (ref 70–99)
Potassium: 4 mmol/L (ref 3.5–5.1)
Sodium: 138 mmol/L (ref 135–145)
Total Bilirubin: 0.4 mg/dL (ref 0.3–1.2)
Total Protein: 7 g/dL (ref 6.5–8.1)

## 2021-04-20 LAB — PREGNANCY, URINE: Preg Test, Ur: NEGATIVE

## 2021-04-20 LAB — CBC
HCT: 39 % (ref 36.0–46.0)
Hemoglobin: 12.4 g/dL (ref 12.0–15.0)
MCH: 24.8 pg — ABNORMAL LOW (ref 26.0–34.0)
MCHC: 31.8 g/dL (ref 30.0–36.0)
MCV: 78.2 fL — ABNORMAL LOW (ref 80.0–100.0)
Platelets: 278 10*3/uL (ref 150–400)
RBC: 4.99 MIL/uL (ref 3.87–5.11)
RDW: 15.6 % — ABNORMAL HIGH (ref 11.5–15.5)
WBC: 5 10*3/uL (ref 4.0–10.5)
nRBC: 0 % (ref 0.0–0.2)

## 2021-04-20 LAB — URINALYSIS, ROUTINE W REFLEX MICROSCOPIC
Bilirubin Urine: NEGATIVE
Glucose, UA: NEGATIVE mg/dL
Hgb urine dipstick: NEGATIVE
Ketones, ur: NEGATIVE mg/dL
Leukocytes,Ua: NEGATIVE
Nitrite: NEGATIVE
Protein, ur: NEGATIVE mg/dL
Specific Gravity, Urine: 1.02 (ref 1.005–1.030)
pH: 6 (ref 5.0–8.0)

## 2021-04-20 LAB — LIPASE, BLOOD: Lipase: 18 U/L (ref 11–51)

## 2021-04-20 LAB — RESP PANEL BY RT-PCR (FLU A&B, COVID) ARPGX2
Influenza A by PCR: NEGATIVE
Influenza B by PCR: NEGATIVE
SARS Coronavirus 2 by RT PCR: NEGATIVE

## 2021-04-20 MED ORDER — SODIUM CHLORIDE 0.9 % IV BOLUS
1000.0000 mL | Freq: Once | INTRAVENOUS | Status: AC
  Administered 2021-04-20: 1000 mL via INTRAVENOUS

## 2021-04-20 MED ORDER — ONDANSETRON HCL 4 MG/2ML IJ SOLN
4.0000 mg | Freq: Once | INTRAMUSCULAR | Status: AC
  Administered 2021-04-20: 4 mg via INTRAVENOUS
  Filled 2021-04-20: qty 2

## 2021-04-20 MED ORDER — HYDROMORPHONE HCL 1 MG/ML IJ SOLN
1.0000 mg | Freq: Once | INTRAMUSCULAR | Status: AC
  Administered 2021-04-20: 1 mg via INTRAVENOUS
  Filled 2021-04-20: qty 1

## 2021-04-20 MED ORDER — SODIUM CHLORIDE 0.9 % IV SOLN: INTRAVENOUS | Status: DC

## 2021-04-20 NOTE — ED Provider Triage Note (Signed)
Emergency Medicine Provider Triage Evaluation Note  Sabrina Harding , a 41 y.o. female  was evaluated in triage.  Pt complains of left flank pain onset this morning as well as pain over her entire face.  She endorses history of kidney stones in the past.  Denies fever, chills, abdominal pain.  Endorses nausea and vomiting.  Denies changes in her vision, or difficulty swallowing.  Review of Systems  Positive: As above Negative: As above  Physical Exam  BP 126/74 (BP Location: Right Arm)    Pulse 73    Temp 98.8 F (37.1 C) (Oral)    Resp 16    LMP 09/01/2012 Comment: Pt states she has not had a period since she was "22-something"   SpO2 92%  Gen:   Awake, no distress   Resp:  Normal effort  MSK:   Moves extremities without difficulty  Other:  Abdomen soft and nontender.  Left flank pain present on exam.  Medical Decision Making  Medically screening exam initiated at 11:14 AM.  Appropriate orders placed.  Sabrina Harding was informed that the remainder of the evaluation will be completed by another provider, this initial triage assessment does not replace that evaluation, and the importance of remaining in the ED until their evaluation is complete.     Evlyn Courier, PA-C 04/20/21 1115

## 2021-04-20 NOTE — ED Triage Notes (Signed)
Pt arrived by [facility] for left side flank pain that has been ongoing, hx of stones 3 months ago that required admission for dehydration. Pt also reports generalized body pain and facial pain. Given fentanyl 19mcg pta.

## 2021-04-20 NOTE — ED Provider Notes (Signed)
[facility]PART[facility] Provider Note   CSN: [id number] Arrival date & time: 04/20/21  1043     History  Chief Complaint  Patient presents with   Flank Pain    Sabrina Harding is a 41 y.o. female.  Patient with onset of not feeling feeling well.  Started last evening.  Patient states that she hurts all over.  And that she is got a cough and some congestion.  T-max here was 99.4.  Patient with some left-sided flank pain ongoing.  A little bit worse here.  Denies any abdominal pain anywhere else.  In particular no right-sided abdominal pain.  Past medical history significant for gallstones.  Patient's had tubal ligation.  Patient states that 3 months ago she had a history of stones that required admission and was associated with dehydration.  They were on the left side as well.      Home Medications Prior to Admission medications   Medication Sig Start Date End Date Taking? Authorizing Provider  dextromethorphan-guaiFENesin (MUCINEX DM) 30-600 MG 12hr tablet Take 1 tablet by mouth 2 (two) times daily. 04/21/21  Yes Fredia Sorrow, MD  HYDROcodone-acetaminophen (NORCO/VICODIN) 5-325 MG tablet Take 1 tablet by mouth every 6 (six) hours as needed for moderate pain. 04/21/21  Yes Fredia Sorrow, MD  ondansetron (ZOFRAN-ODT) 4 MG disintegrating tablet Take 1 tablet (4 mg total) by mouth every 8 (eight) hours as needed for nausea or vomiting. 04/21/21  Yes Fredia Sorrow, MD  hydrOXYzine (ATARAX/VISTARIL) 25 MG tablet Take 1 tablet (25 mg total) by mouth 3 (three) times daily as needed for anxiety. 09/21/20   Sherlon Handing, NP  Oxcarbazepine (TRILEPTAL) 300 MG tablet Take 1 tablet (300 mg total) by mouth daily. 09/22/20   Sherlon Handing, NP  sertraline (ZOLOFT) 50 MG tablet Take 1 tablet (50 mg total) by mouth daily. 09/22/20   Sherlon Handing, NP      Allergies    Patient has no known allergies.    Review of Systems   Review of Systems   Constitutional:  Negative for chills and fever.  HENT:  Positive for congestion. Negative for ear pain and sore throat.   Eyes:  Negative for pain and visual disturbance.  Respiratory:  Positive for cough. Negative for shortness of breath.   Cardiovascular:  Negative for chest pain and palpitations.  Gastrointestinal:  Negative for abdominal pain and vomiting.  Genitourinary:  Positive for flank pain. Negative for dysuria and hematuria.  Musculoskeletal:  Positive for myalgias. Negative for arthralgias and back pain.  Skin:  Negative for color change and rash.  Neurological:  Negative for seizures and syncope.  All other systems reviewed and are negative.  Physical Exam Updated Vital Signs BP 123/83    Pulse 79    Temp 99.4 F (37.4 C)    Resp 17    LMP 09/01/2012 Comment: Pt states she has not had a period since she was "22-something"   SpO2 90%  Physical Exam Vitals and nursing note reviewed.  Constitutional:      General: She is not in acute distress.    Appearance: Normal appearance. She is well-developed.  HENT:     Head: Normocephalic and atraumatic.     Mouth/Throat:     Mouth: Mucous membranes are dry.  Eyes:     Extraocular Movements: Extraocular movements intact.     Conjunctiva/sclera: Conjunctivae normal.     Pupils: Pupils are equal, round, and reactive to light.  Cardiovascular:  Rate and Rhythm: Normal rate and regular rhythm.     Heart sounds: No murmur heard. Pulmonary:     Effort: Pulmonary effort is normal. No respiratory distress.     Breath sounds: Normal breath sounds.  Abdominal:     General: There is no distension.     Palpations: Abdomen is soft.     Tenderness: There is no abdominal tenderness.  Musculoskeletal:        General: No swelling.     Cervical back: Normal range of motion and neck supple.  Skin:    General: Skin is warm and dry.     Capillary Refill: Capillary refill takes less than 2 seconds.  Neurological:     General: No focal  deficit present.     Mental Status: She is alert and oriented to person, place, and time.  Psychiatric:        Mood and Affect: Mood normal.    ED Results / Procedures / Treatments   Labs (all labs ordered are listed, but only abnormal results are displayed) Labs Reviewed  CBC - Abnormal; Notable for the following components:      Result Value   MCV 78.2 (*)    MCH 24.8 (*)    RDW 15.6 (*)    All other components within normal limits  COMPREHENSIVE METABOLIC PANEL - Abnormal; Notable for the following components:   Glucose, Bld 106 (*)    BUN <5 (*)    Albumin 3.3 (*)    All other components within normal limits  RESP PANEL BY RT-PCR (FLU A&B, COVID) ARPGX2  LIPASE, BLOOD  URINALYSIS, ROUTINE W REFLEX MICROSCOPIC  PREGNANCY, URINE    EKG None  Radiology DG Chest 2 View  Result Date: 04/20/2021 CLINICAL DATA:  Flu symptoms EXAM: CHEST - 2 VIEW COMPARISON:  04/08/2020 FINDINGS: Transverse diameter of heart is increased. There are no signs of pulmonary edema or focal pulmonary infiltrates. There is no pleural effusion or pneumothorax. IMPRESSION: Cardiomegaly. There are no signs of pulmonary edema or focal pulmonary consolidation. Electronically Signed   By: Elmer Picker M.D.   On: 04/20/2021 20:18   CT Renal Stone Study  Result Date: 04/20/2021 CLINICAL DATA:  Flank pain EXAM: CT ABDOMEN AND PELVIS WITHOUT CONTRAST TECHNIQUE: Multidetector CT imaging of the abdomen and pelvis was performed following the standard protocol without IV contrast. RADIATION DOSE REDUCTION: This exam was performed according to the departmental dose-optimization program which includes automated exposure control, adjustment of the mA and/or kV according to patient size and/or use of iterative reconstruction technique. COMPARISON:  07/24/2018 FINDINGS: Lower chest: Dependent hazy ground-glass densities in both lower lobes with a suggestion of associated volume loss, suggesting an atelectatic  component, cannot exclude a component of alveolitis particularly left lower lobe. Mild enlargement of the cardiopericardial silhouette is present. Hepatobiliary: Cholecystectomy. Otherwise unremarkable noncontrast CT appearance of the liver. Pancreas: Unremarkable Spleen: Unremarkable Adrenals/Urinary Tract: Approximately 1.5 cm fluid density lesion of the left mid kidney, favoring a cyst although enhancement characteristics are not interrogated. A similar hypodense lesion measuring 1.2 cm in diameter was present on the examination from 07/24/2018. No urinary tract calculi are observed. Normal adrenal glands. Unremarkable urinary bladder. Unremarkable renal contours. Stomach/Bowel: The proximal appendix appears normal on image 71 of series 3. A small segment of the midportion of the appendix measures up to 0.8 cm which is mildly abnormally prominent. The distal appendix measures 0.7 cm in diameter which is upper normal. No obvious periappendiceal stranding. No dilated bowel.  Mild prominence of stool in the colon, suggesting constipation. Vascular/Lymphatic: Unremarkable Reproductive: Unremarkable Other: No supplemental non-categorized findings. Musculoskeletal: Mild chronic sclerosis along both sacroiliac joints suggesting low-grade bilateral chronic sacroiliitis. IMPRESSION: 1. Mildly thickened segment of the mid to distal appendix at 0.8 cm, but without surrounding inflammatory findings. Correlate with physical exam findings in assessing significance and whether this may represent early acute appendicitis. 2.  Prominent stool throughout the colon favors constipation. 3. Mild chronic bilateral sacroiliitis. 4. Mild cardiomegaly. 5. Hazy density in both dependent lung bases, a component of which is from atelectasis. I cannot exclude a component of alveolitis particularly in the left lower lobe. 6. Hypodense left mid kidney lesion is probably a cyst. Electronically Signed   By: Van Clines M.D.   On:  04/20/2021 11:57    Procedures Procedures    Medications Ordered in ED Medications  0.9 %  sodium chloride infusion (has no administration in time range)  sodium chloride 0.9 % bolus 1,000 mL (1,000 mLs Intravenous New Bag/Given 04/20/21 2042)  ondansetron (ZOFRAN) injection 4 mg (4 mg Intravenous Given 04/20/21 2035)  HYDROmorphone (DILAUDID) injection 1 mg (1 mg Intravenous Given 04/20/21 2036)    ED Course/ Medical Decision Making/ A&P                           Medical Decision Making  Patient vital signs temp 99.4.  Heart rate 79 respiration 16 blood pressure 118/67 oxygen sats 100%.  Patient based on her description feels as if she is got some kind of viral upper respiratory symptoms.  Triage ordered CT of the abdomen because of the left flank pain.  That raised concerns for possible early appendicitis.  And they requested clinical correlation.  Because there was no inflammation around the appendix.  Just had some thickening of the appendix mid distal area.  Patient did have prominent stool throughout the abdomen.  Patient on exam has absolutely no tenderness to the right lower quadrant.  And her symptoms are more generalized body aches cough congestion not feeling well.  No nausea vomiting or diarrhea.  In addition patient does not have a leukocytosis.  COVID and influenza is negative.  Urinalysis negative.  Pregnancy test negative.  CBC as mentioned had a white count of 5000.  Hemoglobin is 12.4.  Platelets 278.  Lipase normal.  Complete metabolic panel without any acute abnormalities liver function tests normal.  And GFR is greater than 60.  Chest x-ray was done because of the flulike symptoms.  Which showed cardiomegaly but no signs of pulmonary edema or focal pulmonary consolidation.  Again clinically no evidence of acute appendicitis.  Based on her presentation.  Patient has no discomfort on the right side of the abdomen no leukocytosis.  Patient's symptoms are all more upper  respiratory flulike in nature.  There was no evidence of any inflammation around appendix.  There was just some mid distal area that was thickened.  Patient will be given precautions to return for development of any right-sided abdominal pain.  Will treat with Mucinex DM Zofran and a course of some pain medicine.  And recommend over-the-counter MiraLAX for the constipation.  Note will be provided.   Final Clinical Impression(s) / ED Diagnoses Final diagnoses:  Left flank pain  Flu-like symptoms    Rx / DC Orders ED Discharge Orders          Ordered    HYDROcodone-acetaminophen (NORCO/VICODIN) 5-325 MG tablet  Every 6  hours PRN        04/21/21 0007    ondansetron (ZOFRAN-ODT) 4 MG disintegrating tablet  Every 8 hours PRN        04/21/21 0007    dextromethorphan-guaiFENesin (MUCINEX DM) 30-600 MG 12hr tablet  2 times daily        04/21/21 0007              Fredia Sorrow, MD 04/21/21 0008

## 2021-04-21 MED ORDER — ONDANSETRON 4 MG PO TBDP: 4.0000 mg | ORAL_TABLET | Freq: Three times a day (TID) | ORAL | 0 refills | Status: DC | PRN

## 2021-04-21 MED ORDER — ONDANSETRON 4 MG PO TBDP
ORAL_TABLET | ORAL | Status: AC
  Filled 2021-04-21: qty 1

## 2021-04-21 MED ORDER — DM-GUAIFENESIN ER 30-600 MG PO TB12: 1.0000 | ORAL_TABLET | Freq: Two times a day (BID) | ORAL | 0 refills | Status: DC

## 2021-04-21 MED ORDER — ONDANSETRON 4 MG PO TBDP
4.0000 mg | ORAL_TABLET | Freq: Once | ORAL | Status: AC
  Administered 2021-04-21: 4 mg via ORAL

## 2021-04-21 MED ORDER — ACETAMINOPHEN 325 MG PO TABS: 325.0000 mg | ORAL_TABLET | Freq: Once | ORAL | Status: DC

## 2021-04-21 MED ORDER — HYDROCODONE-ACETAMINOPHEN 5-325 MG PO TABS: 1.0000 | ORAL_TABLET | Freq: Four times a day (QID) | ORAL | 0 refills | Status: DC | PRN

## 2021-04-21 NOTE — ED Notes (Addendum)
At time of d/c pt says "I can't move". Pt was independently able to stand and get to w/c. She was able to independtly go to restroom. She had episode of coughing and post tussive vomiting. Reviewed vomiting episode with dr. Christy Gentles who reviewed chart, ok for zofran dose.

## 2021-04-21 NOTE — Discharge Instructions (Addendum)
Take the pain medication as needed.  Also okay to take Motrin as well.  Take the Zofran as needed for nausea vomiting.  Take the Mucinex DM for the cough and congestion.  Work note provided.  CT scan did raise some question about a little bit of enlargement of the appendix.  But no evidence of any infection around the appendix area.  However if you develop right-sided abdominal pain or persistent nausea vomiting need to get seen again right away.

## 2021-12-14 ENCOUNTER — Emergency Department (HOSPITAL_COMMUNITY): Payer: Medicaid Other

## 2021-12-14 ENCOUNTER — Encounter (HOSPITAL_COMMUNITY): Payer: Self-pay | Admitting: Emergency Medicine

## 2021-12-14 ENCOUNTER — Emergency Department (HOSPITAL_COMMUNITY)
Admission: EM | Admit: 2021-12-14 | Discharge: 2021-12-14 | Disposition: A | Discharge status: Home / Self Care | Payer: Medicaid Other | Attending: Emergency Medicine | Admitting: Emergency Medicine

## 2021-12-14 ENCOUNTER — Other Ambulatory Visit: Payer: Self-pay

## 2021-12-14 DIAGNOSIS — N9489 Other specified conditions associated with female genital organs and menstrual cycle: Secondary | ICD-10-CM | POA: Insufficient documentation

## 2021-12-14 DIAGNOSIS — N390 Urinary tract infection, site not specified: Secondary | ICD-10-CM | POA: Insufficient documentation

## 2021-12-14 DIAGNOSIS — Z20822 Contact with and (suspected) exposure to covid-19: Secondary | ICD-10-CM | POA: Insufficient documentation

## 2021-12-14 DIAGNOSIS — R519 Headache, unspecified: Secondary | ICD-10-CM

## 2021-12-14 DIAGNOSIS — J069 Acute upper respiratory infection, unspecified: Secondary | ICD-10-CM | POA: Insufficient documentation

## 2021-12-14 LAB — CBC
HCT: 40 % (ref 36.0–46.0)
Hemoglobin: 12.7 g/dL (ref 12.0–15.0)
MCH: 24.8 pg — ABNORMAL LOW (ref 26.0–34.0)
MCHC: 31.8 g/dL (ref 30.0–36.0)
MCV: 78.1 fL — ABNORMAL LOW (ref 80.0–100.0)
Platelets: 310 10*3/uL (ref 150–400)
RBC: 5.12 MIL/uL — ABNORMAL HIGH (ref 3.87–5.11)
RDW: 15.4 % (ref 11.5–15.5)
WBC: 6.2 10*3/uL (ref 4.0–10.5)
nRBC: 0 % (ref 0.0–0.2)

## 2021-12-14 LAB — URINALYSIS, ROUTINE W REFLEX MICROSCOPIC
Bilirubin Urine: NEGATIVE
Glucose, UA: NEGATIVE mg/dL
Hgb urine dipstick: NEGATIVE
Ketones, ur: NEGATIVE mg/dL
Nitrite: POSITIVE — AB
Protein, ur: NEGATIVE mg/dL
Specific Gravity, Urine: 1.013 (ref 1.005–1.030)
pH: 5 (ref 5.0–8.0)

## 2021-12-14 LAB — COMPREHENSIVE METABOLIC PANEL
ALT: 15 U/L (ref 0–44)
AST: 19 U/L (ref 15–41)
Albumin: 3.4 g/dL — ABNORMAL LOW (ref 3.5–5.0)
Alkaline Phosphatase: 73 U/L (ref 38–126)
Anion gap: 9 (ref 5–15)
BUN: 8 mg/dL (ref 6–20)
CO2: 24 mmol/L (ref 22–32)
Calcium: 9.1 mg/dL (ref 8.9–10.3)
Chloride: 107 mmol/L (ref 98–111)
Creatinine, Ser: 1 mg/dL (ref 0.44–1.00)
GFR, Estimated: >60 mL/min (ref >60)
Glucose, Bld: 114 mg/dL — ABNORMAL HIGH (ref 70–99)
Potassium: 4.3 mmol/L (ref 3.5–5.1)
Sodium: 140 mmol/L (ref 135–145)
Total Bilirubin: 0.5 mg/dL (ref 0.3–1.2)
Total Protein: 7.2 g/dL (ref 6.5–8.1)

## 2021-12-14 LAB — I-STAT BETA HCG BLOOD, ED (MC, WL, AP ONLY): I-stat hCG, quantitative: <5 m[IU]/mL (ref <5)

## 2021-12-14 LAB — LIPASE, BLOOD: Lipase: 21 U/L (ref 11–51)

## 2021-12-14 LAB — RESP PANEL BY RT-PCR (FLU A&B, COVID) ARPGX2
Influenza A by PCR: NEGATIVE
Influenza B by PCR: NEGATIVE
SARS Coronavirus 2 by RT PCR: NEGATIVE

## 2021-12-14 MED ORDER — IBUPROFEN 400 MG PO TABS
600.0000 mg | ORAL_TABLET | Freq: Once | ORAL | Status: AC
  Administered 2021-12-14: 600 mg via ORAL
  Filled 2021-12-14: qty 1

## 2021-12-14 MED ORDER — CETIRIZINE HCL 10 MG PO TABS: 10.0000 mg | ORAL_TABLET | Freq: Every day | ORAL | 0 refills | Status: AC

## 2021-12-14 MED ORDER — CEPHALEXIN 500 MG PO CAPS: 500.0000 mg | ORAL_CAPSULE | Freq: Three times a day (TID) | ORAL | 0 refills | Status: AC

## 2021-12-14 MED ORDER — ACETAMINOPHEN 325 MG PO TABS: 650.0000 mg | ORAL_TABLET | Freq: Four times a day (QID) | ORAL | Status: DC | PRN

## 2021-12-14 MED ORDER — ACETAMINOPHEN 650 MG RE SUPP: 650.0000 mg | Freq: Once | RECTAL | Status: DC

## 2021-12-14 NOTE — ED Provider Triage Note (Signed)
Emergency Medicine Provider Triage Evaluation Note  Sabrina Harding , a 41 y.o. female  was evaluated in triage.  Pt complains of multiple complaints.  Patient complains of headache, cough, sore throat, left-sided "pinching" abdominal pain.  Patient says that headache began gradual in onset yesterday and feels similar in severity and location as prior headaches.  She has taken 1 dose of at home Tylenol with minimal relief but states this is per baseline.  Denies visual changes, slurred speech, facial droop, weakness/sensory deficits.  She states that cough, congestion, sore throat began 2 days ago as well.  She has been able to tolerate oral liquids and foods without difficulty.  Denies difficulty breathing.  She states that left-sided abdominal pain has been going on for "years and years".  Denies fever, chills, night sweats, chest pain, shortness of breath, nausea, vomiting, urinary/vaginal symptoms, change in bowel habits.  Review of Systems  Positive: See above Negative:   Physical Exam  BP (!) 140/101 (BP Location: Right Arm)   Pulse 80   Temp 98.1 F (36.7 C) (Oral)   Resp 16   LMP 09/01/2012 Comment: Pt states she has not had a period since she was "22-something"  SpO2 97%  Gen:   Awake, no distress   Resp:  Normal effort  MSK:   Moves extremities without difficulty  Other:  Mild left-sided flank tenderness.  No CVA tenderness bilaterally.  Cranial nerve III through XII grossly intact.  Uvula midline and rises symmetrically with phonation.  Mild posterior pharyngeal erythema but no obvious exudate.  No stridor upon auscultation.  Lungs clear to auscultation bilaterally.  Medical Decision Making  Medically screening exam initiated at 12:33 PM.  Appropriate orders placed.  Sabrina Harding was informed that the remainder of the evaluation will be completed by another provider, this initial triage assessment does not replace that evaluation, and the importance of remaining in the  ED until their evaluation is complete.     Wilnette Kales, [place] 12/14/21 1235

## 2021-12-14 NOTE — ED Triage Notes (Signed)
Patient complains of generalized body aches, headache, and a "pinching sensation" on her left abdomen that started a few days ago. Denies vomiting, denies diarrhea. Patient is alert, oriented, speaking in complete sentences, afebrile in triage, and in no apparent distress at this time

## 2021-12-14 NOTE — ED Notes (Signed)
ED Provider at bedside. 

## 2021-12-14 NOTE — ED Provider Notes (Signed)
[facility] Provider Note   CSN: [id number] Arrival date & time: 12/14/21  1109     History  Chief Complaint  Patient presents with   Headache    Sabrina Harding is a 41 y.o. female with Hx of PID, prior pyelonephritis, and MDD.  Presenting with multiple complaints today.  With body aches, left-sided intermittent abdominal pain, cough, sore throat over the last 3 days.  States the throat feels sore because of her myalgias, not due to "something like strep".  Specifies abdominal pain has been ongoing over the last few years.  Also with headache that began gradually yesterday and feels similar in severity and location of prior headaches.  Tried 1 dose of Tylenol at home yesterday with some relief.  States sometimes her headaches respond to 1 dose of Tylenol and sometimes they still linger, at baseline.  Denies fever, chills, flank pain, neck stiffness, shortness of breath, chest pain, N/V/D, vaginal symptoms, or changes in urinary or bowel habits.  Hx of gallstones, cholecystectomy, and prior tubal ligation.  The history is provided by the patient and medical records.  Headache Associated symptoms: abdominal pain (Chronic), congestion and sore throat      Home Medications Prior to Admission medications   Medication Sig Start Date End Date Taking? Authorizing Provider  acetaminophen (TYLENOL) 500 MG tablet Take 500 mg by mouth every 6 (six) hours as needed for moderate pain.   Yes [provider]  cephALEXin (KEFLEX) 500 MG capsule Take 1 capsule (500 mg total) by mouth 3 (three) times daily for 5 days. 12/14/21 12/19/21 Yes Prince Rome, PA-C  cetirizine (ZYRTEC ALLERGY) 10 MG tablet Take 1 tablet (10 mg total) by mouth daily. 12/14/21  Yes Prince Rome, PA-C  hydrOXYzine (ATARAX/VISTARIL) 25 MG tablet Take 1 tablet (25 mg total) by mouth 3 (three) times daily as needed for anxiety. Patient not taking: Reported on 12/14/2021  09/21/20   Sherlon Handing, NP  ondansetron (ZOFRAN-ODT) 4 MG disintegrating tablet Take 1 tablet (4 mg total) by mouth every 8 (eight) hours as needed for nausea or vomiting. Patient not taking: Reported on 12/14/2021 04/21/21   Fredia Sorrow, MD  Oxcarbazepine (TRILEPTAL) 300 MG tablet Take 1 tablet (300 mg total) by mouth daily. Patient not taking: Reported on 12/14/2021 09/22/20   Sherlon Handing, NP  sertraline (ZOLOFT) 50 MG tablet Take 1 tablet (50 mg total) by mouth daily. Patient not taking: Reported on 12/14/2021 09/22/20   Sherlon Handing, NP      Allergies    Patient has no known allergies.    Review of Systems   Review of Systems  HENT:  Positive for congestion and sore throat.   Gastrointestinal:  Positive for abdominal pain (Chronic).  Neurological:  Positive for headaches.    Physical Exam Updated Vital Signs BP (!) 140/80 (BP Location: Right Arm)   Pulse 81   Temp 98.3 F (36.8 C) (Oral)   Resp 18   LMP 09/01/2012 Comment: Pt states she has not had a period since she was "22-something"  SpO2 100%  Physical Exam Vitals and nursing note reviewed.  Constitutional:      General: She is not in acute distress.    Appearance: She is well-developed. She is obese. She is not ill-appearing, toxic-appearing or diaphoretic.  HENT:     Head: Normocephalic and atraumatic.     Right Ear: Tympanic membrane, ear canal and external ear normal.  Left Ear: Tympanic membrane, ear canal and external ear normal.     Nose: Congestion and rhinorrhea present. Rhinorrhea is clear.     Mouth/Throat:     Mouth: Mucous membranes are moist. No angioedema.     Tongue: No lesions. Tongue does not deviate from midline.     Palate: No mass.     Pharynx: Oropharynx is clear. Uvula midline. No pharyngeal swelling, oropharyngeal exudate, posterior oropharyngeal erythema or uvula swelling.     Tonsils: No tonsillar exudate or tonsillar abscesses.  Eyes:     Extraocular Movements:  Extraocular movements intact.     Conjunctiva/sclera: Conjunctivae normal.     Pupils: Pupils are equal, round, and reactive to light.  Neck:     Comments: No meningismus or torticollis Cardiovascular:     Rate and Rhythm: Normal rate and regular rhythm.     Heart sounds: No murmur heard. Pulmonary:     Effort: Pulmonary effort is normal. No respiratory distress.     Breath sounds: Normal breath sounds. No stridor. No wheezing or rales.  Chest:     Chest wall: No tenderness.  Abdominal:     General: There is no distension.     Palpations: Abdomen is soft. There is no mass.     Tenderness: There is no abdominal tenderness. There is no guarding.  Musculoskeletal:        General: No swelling.     Cervical back: Neck supple. No rigidity.  Lymphadenopathy:     Cervical: No cervical adenopathy.  Skin:    General: Skin is warm and dry.     Capillary Refill: Capillary refill takes less than 2 seconds.     Coloration: Skin is not cyanotic or pale.     Findings: No rash.  Neurological:     Mental Status: She is alert and oriented to person, place, and time.     GCS: GCS eye subscore is 4. GCS verbal subscore is 5. GCS motor subscore is 6.     Cranial Nerves: No dysarthria or facial asymmetry.     Motor: No weakness.     Coordination: Coordination normal.     Gait: Gait normal.     Comments: No truncal deviation, dysarthria, facial asymmetry.  Coordination and strength and range of motion appears grossly intact.  Psychiatric:        Mood and Affect: Mood normal.     ED Results / Procedures / Treatments   Labs (all labs ordered are listed, but only abnormal results are displayed) Labs Reviewed  COMPREHENSIVE METABOLIC PANEL - Abnormal; Notable for the following components:      Result Value   Glucose, Bld 114 (*)    Albumin 3.4 (*)    All other components within normal limits  CBC - Abnormal; Notable for the following components:   RBC 5.12 (*)    MCV 78.1 (*)    MCH 24.8 (*)     All other components within normal limits  URINALYSIS, ROUTINE W REFLEX MICROSCOPIC - Abnormal; Notable for the following components:   APPearance HAZY (*)    Nitrite POSITIVE (*)    Leukocytes,Ua SMALL (*)    Bacteria, UA MANY (*)    All other components within normal limits  RESP PANEL BY RT-PCR (FLU A&B, COVID) ARPGX2  LIPASE, BLOOD  I-STAT BETA HCG BLOOD, ED ([facility], [facility], AP ONLY)    EKG None  Radiology DG Chest 2 View  Result Date: 12/14/2021 CLINICAL DATA:  Cough. EXAM: CHEST -  2 VIEW COMPARISON:  None Available. FINDINGS: The heart size and mediastinal contours are within normal limits. Both lungs are clear. No visible pleural effusions or pneumothorax. No acute osseous abnormality. IMPRESSION: No active cardiopulmonary disease. Electronically Signed   By: Margaretha Sheffield M.D.   On: 12/14/2021 13:05    Procedures Procedures    Medications Ordered in ED Medications  acetaminophen (TYLENOL) tablet 650 mg (has no administration in time range)  ibuprofen (ADVIL) tablet 600 mg (600 mg Oral Given 12/14/21 1855)    ED Course/ Medical Decision Making/ A&P                           Medical Decision Making Amount and/or Complexity of Data Reviewed Labs: ordered.  Risk OTC drugs.   41 y.o. female presents to the ED for concern of Headache   This involves an extensive number of treatment options, and is a complaint that carries with it a high risk of complications and morbidity.  The emergent differential diagnosis prior to evaluation includes, but is not limited to: Acute COVID-19 infection, upper respiratory infection, UTI, renal calculi, pyelonephritis  This is not an exhaustive differential.   Past Medical History / Co-morbidities / Social History: Hx of PID, prior pyelonephritis, MDD, prior gonorrhea and trichomonas infection Social Determinants of Health include: None  Additional History:  Obtained by chart review.  Notably seen in ED on 04/20/2021 for similar  symptoms, however symptoms less severe today  Lab Tests: I ordered, and personally interpreted labs.  The pertinent results include:   UA indicative of UTI, WBC 11-20, many bacteria, nitrite positive, small leukocytes WBC 6.2, no anemia I-STAT beta-hCG negative Lipase 21 COVID/flu negative  Imaging Studies: I ordered imaging studies including CXR.   I independently visualized and interpreted imaging which showed no evidence of pneumonia, bronchitis, pneumothorax, or cardiomegaly I agree with the radiologist interpretation.  ED Course: Pt well-appearing on exam.  Presenting with multiple nonspecific symptoms.  These include global myalgias, congestion, left-sided abdominal pain along the mid axillary line, mild nonproductive cough, mild sore throat, mild intermittent headache over the last 48 hours.  Afebrile, nonseptic, nontoxic appearing in NAD.  AAOx4.  No recent injury or trauma.  Mild congestion on exam.  Without CVA tenderness or flank pain.  Abdomen soft and globally non-TTP.  Without urinary symptoms or constipation or diarrhea.  When palpating throat, patient states it feels the same as myalgias.  No evidence of posterior oropharynx erythema, tonsillar swelling or exudate, petechia, or dysphagia.  Able to swallow without difficulty.  Exam nonconcerning for RPA or PTA.  Passed p.o. challenge.  Without elevated white count or leukocytosis.  UA nitrite positive with WBCs, indicative of UTI.  No new sexual partners.  Lungs CTAB.  Communicates without any difficulty.  Equal chest rise.  COVID and flu negative.  Negative pregnancy.  Clinically without evidence of acute appendicitis, pyelonephritis, or obstructing renal calculi.  Ordered Tylenol and ibuprofen, plan to reassess.  If without improvement, may consider imaging at that time. Upon reevaluation, notable improvement with Tylenol and ibuprofen.  Due to patient's hemodynamic stability, without evidence of sepsis, identified UTI, and  otherwise unremarkable lab and physical exam findings, I believe further imaging may be deferred at this time.  Shared decision making with patient.  Pt elects to defer at this time, with strict return precautions discussed.  Recommend short course of antibiotics for UTI treatment with close PCP follow-up for reevaluation and continued  medical management.  Patient reports satisfaction with today's encounter.  Requests note for work to return back Wednesday due to work policy.  Work note given.  Patient in NAD and in good condition at time of discharge.  Disposition: After consideration the patient's encounter today, I do not feel today's workup suggests an emergent condition requiring admission or immediate intervention beyond what has been performed at this time.  Safe for discharge; instructed to return immediately for worsening symptoms, change in symptoms or any other concerns.  I have reviewed the patients home medicines and have made adjustments as needed.  Discussed course of treatment with the patient, whom demonstrated understanding.  Patient in agreement and has no further questions.    I discussed this case with my attending physician Dr. Ronnald Nian, who agreed with the proposed treatment course and cosigned this note including patient's presenting symptoms, physical exam, and planned diagnostics and interventions.  Attending physician stated agreement with plan or made changes to plan which were implemented.     This chart was dictated using voice recognition software.  Despite best efforts to proofread, errors can occur which can change the documentation meaning.         Final Clinical Impression(s) / ED Diagnoses Final diagnoses:  Acute upper respiratory infection  Bad headache  Urinary tract infection without hematuria, site unspecified    Rx / DC Orders ED Discharge Orders          Ordered    cephALEXin (KEFLEX) 500 MG capsule  3 times daily        12/14/21 1850     cetirizine (ZYRTEC ALLERGY) 10 MG tablet  Daily        12/14/21 [place], Dulcinea Kinser M, Hershal Coria 82/70/78 [place], [place], DO 12/14/21 2155

## 2021-12-14 NOTE — Discharge Instructions (Addendum)
Your COVID and flu test today were negative, however your urine showed evidence of UTI.  An antibiotic has been called in your pharmacy by the name of Keflex.  Take 1 capsule every 8 hours for the next 5 days.  Always take with plenty of food and water.  You may take a probiotic capsule daily or probiotic yogurt daily during the course of antibiotics to reduce the risk of an antibiotic associated yeast infection.  Continue to manage your symptoms from home.  This includes utilizing Tylenol and ibuprofen for headaches and bodily aches, taking Zyrtec daily to help reduce congestion, and focusing on hydration, adequate nutritional intake, and plenty of rest.  Please follow-up with your PCP within the next 2 to 3 days for reevaluation continued medical management.  If you do not have a doctor see the list below.  RESOURCE GUIDE  Chronic Pain Problems: Contact [facility]  [phone] Patients need to be referred by their primary care doctor.  Insufficient Money for Medicine: Contact [employer]:  call "211" or [place] [phone].  No Primary Care Doctor: Call [facility]  [phone] - can help you locate a primary care doctor that  accepts your insurance, provides certain services, etc. Physician Referral Service- [phone] Agencies that provide inexpensive medical care: [facility]  [facility]  [phone] [facility]  [phone] [facility]  [phone] [facility]  [phone] [facility]  [phone]  [place] Providers: [facility]- [address], [address]  [phone], Mon-Fri 9am-7pm, Sat 9am-1pm [place], [address] [place], [address] [place]  [place]- [address]  [employer], [address], [phone]  Only accepts [place] patients after they have their name  applied to their card  Self Pay (no insurance) in [facility]: Sickle Cell Patients: Dr Kevan Ny, [facility]  [place], [facility]- [place]  [facility] [place]- [address], [facility]- see information above (Speak to [employer] if you do not have insurance)       -  [facility]- [place], [place],  [facility] [place], [place]  Dr Vista Lawman-  [address], [place], [facility]- [address], [phone]       -  [facility]- [address], [place], also [address], [phone]       -    [facility]- [address], [place], 1st & 3rd Saturday   every month, 10am-1pm  1) Find a Doctor and Pay Out of Pocket Although you won't have to find out who is covered by your insurance plan, it is a good idea to ask around and get recommendations. You will then need to call the office and see  if the doctor you have chosen will accept you as a new patient and what types of options they offer for patients who are self-pay. Some doctors offer discounts or will set up payment plans for their patients who do not have insurance, but you will need to ask so you aren't surprised when you get to your appointment.  2) Contact Your Local Health Department Not all health departments have doctors that can see patients for sick visits, but many do, so it is worth a call to see if yours does. If you don't know where your local health department is, you can check in your phone book. The CDC also has a tool to help you locate your state's health department, and many state websites also  have listings of all of their local health departments.  3) Find a [facility] If your illness is not likely to be very severe or complicated, you may want to try a walk in clinic. These are popping up all over the country in pharmacies, drugstores, and shopping centers. They're usually staffed by nurse practitioners or physician assistants that have been trained to treat common illnesses and complaints. They're usually fairly quick and inexpensive. However, if you have serious medical issues or chronic medical problems, these are probably not your best option  [facility] [place], [facility], [address] [place], phone [phone] or [phone].  Monday - Friday, call for an appointment. [place], [facility], [address], [place], phone [phone] or [phone].  Monday - Friday, call for an appointment.  Abuse/Neglect: [place] [phone] [place] [phone] (After Hours)  Emergency Shelter:  [facility] [phone]  Maternity Homes: [facility] [place] [phone] [place] [phone]  MRSA Hotline #:   717-082-2038  [facility] Dept. [address]      [place] Phone:  [phone]                                   Phone:  [phone]                 Phone:  [place], [place] in [place], [address]  [phone], [place] [phone] or [phone] (After Hours)  [place]  Substance Abuse Resources: Alcohol and Drug Services  [place] [phone] [facility] [place] Chinita Pester [phone] Residential & Outpatient Substance Abuse Program  281-582-1972  Psychological Services: [place]  [phone] [place]  [employer], [place] [address] [place], [place]: [phone] or [phone], [employer]  Dental Assistance  Patients with Medicaid: [place] ([phone] [address] Phone:  [phone]                                                  Phone:  [phone]  If unable to pay or uninsured, contact:  [facility] or [facility]. to become qualified for the adult dental clinic.  Patients with Medicaid: [facility] [phone] [address], [place] [address], [phone]  If unable to pay, or uninsured, contact [facility] [phone]) or [place] [phone] in [place], [place] in [facility] [place]) to become qualified for the adult dental clinic  Other [place]- [place], [place], [place], [address], [place], [place], 2nd and 4th Thursday of the month at 6:30am.  10 clients each day by appointment, can sometimes see walk-in patients if someone does not show for an appointment. [facility]- [address] Hillard Danker [place], [place], [address], [place], [place], [place], [address], [place] Department- [phone] [place] [facility]  [phone]

## 2021-12-14 NOTE — ED Notes (Signed)
All discharge instructions including follow up care and prescriptions reviewed with patient and patient verbalized understanding of same. Patient stable and ambulatory at time of discharge.  

## 2022-11-02 ENCOUNTER — Emergency Department (HOSPITAL_COMMUNITY): Payer: Self-pay

## 2022-11-02 ENCOUNTER — Encounter (HOSPITAL_COMMUNITY): Payer: Self-pay | Admitting: Emergency Medicine

## 2022-11-02 ENCOUNTER — Other Ambulatory Visit: Payer: Self-pay

## 2022-11-02 ENCOUNTER — Emergency Department (HOSPITAL_COMMUNITY)
Admission: EM | Admit: 2022-11-02 | Discharge: 2022-11-02 | Disposition: A | Discharge status: Home / Self Care | Payer: Self-pay | Attending: Emergency Medicine | Admitting: Emergency Medicine

## 2022-11-02 DIAGNOSIS — R519 Headache, unspecified: Secondary | ICD-10-CM | POA: Insufficient documentation

## 2022-11-02 DIAGNOSIS — R0789 Other chest pain: Secondary | ICD-10-CM | POA: Insufficient documentation

## 2022-11-02 DIAGNOSIS — N3000 Acute cystitis without hematuria: Secondary | ICD-10-CM

## 2022-11-02 DIAGNOSIS — K5901 Slow transit constipation: Secondary | ICD-10-CM | POA: Insufficient documentation

## 2022-11-02 LAB — BASIC METABOLIC PANEL
Anion gap: 9 (ref 5–15)
BUN: 8 mg/dL (ref 6–20)
CO2: 24 mmol/L (ref 22–32)
Calcium: 9 mg/dL (ref 8.9–10.3)
Chloride: 104 mmol/L (ref 98–111)
Creatinine, Ser: 0.84 mg/dL (ref 0.44–1.00)
GFR, Estimated: >60 mL/min (ref >60)
Glucose, Bld: 117 mg/dL — ABNORMAL HIGH (ref 70–99)
Potassium: 3.9 mmol/L (ref 3.5–5.1)
Sodium: 137 mmol/L (ref 135–145)

## 2022-11-02 LAB — URINALYSIS, ROUTINE W REFLEX MICROSCOPIC
Bilirubin Urine: NEGATIVE
Glucose, UA: NEGATIVE mg/dL
Hgb urine dipstick: NEGATIVE
Ketones, ur: NEGATIVE mg/dL
Nitrite: POSITIVE — AB
Protein, ur: NEGATIVE mg/dL
Specific Gravity, Urine: 1.016 (ref 1.005–1.030)
pH: 5 (ref 5.0–8.0)

## 2022-11-02 LAB — TROPONIN I (HIGH SENSITIVITY)
Troponin I (High Sensitivity): 3 ng/L (ref <18)
Troponin I (High Sensitivity): 4 ng/L (ref <18)

## 2022-11-02 LAB — POC URINE PREG, ED: Preg Test, Ur: NEGATIVE

## 2022-11-02 LAB — HEPATIC FUNCTION PANEL
ALT: 14 U/L (ref 0–44)
AST: 16 U/L (ref 15–41)
Albumin: 3.1 g/dL — ABNORMAL LOW (ref 3.5–5.0)
Alkaline Phosphatase: 79 U/L (ref 38–126)
Bilirubin, Direct: <0.1 mg/dL (ref 0.0–0.2)
Total Bilirubin: 0.5 mg/dL (ref 0.3–1.2)
Total Protein: 7.3 g/dL (ref 6.5–8.1)

## 2022-11-02 LAB — CBC
HCT: 38.8 % (ref 36.0–46.0)
Hemoglobin: 12.4 g/dL (ref 12.0–15.0)
MCH: 25.3 pg — ABNORMAL LOW (ref 26.0–34.0)
MCHC: 32 g/dL (ref 30.0–36.0)
MCV: 79 fL — ABNORMAL LOW (ref 80.0–100.0)
Platelets: 357 10*3/uL (ref 150–400)
RBC: 4.91 MIL/uL (ref 3.87–5.11)
RDW: 15.4 % (ref 11.5–15.5)
WBC: 7.5 10*3/uL (ref 4.0–10.5)
nRBC: 0 % (ref 0.0–0.2)

## 2022-11-02 LAB — LIPASE, BLOOD: Lipase: 23 U/L (ref 11–51)

## 2022-11-02 MED ORDER — KETOROLAC TROMETHAMINE 15 MG/ML IJ SOLN
15.0000 mg | Freq: Once | INTRAMUSCULAR | Status: AC
  Administered 2022-11-02: 15 mg via INTRAVENOUS
  Filled 2022-11-02: qty 1

## 2022-11-02 MED ORDER — METOCLOPRAMIDE HCL 5 MG/ML IJ SOLN
10.0000 mg | Freq: Once | INTRAMUSCULAR | Status: AC
  Administered 2022-11-02: 10 mg via INTRAVENOUS
  Filled 2022-11-02: qty 2

## 2022-11-02 MED ORDER — DEXAMETHASONE SODIUM PHOSPHATE 10 MG/ML IJ SOLN
10.0000 mg | Freq: Once | INTRAMUSCULAR | Status: AC
  Administered 2022-11-02: 10 mg via INTRAVENOUS
  Filled 2022-11-02: qty 1

## 2022-11-02 MED ORDER — PANTOPRAZOLE SODIUM 40 MG IV SOLR
40.0000 mg | Freq: Once | INTRAVENOUS | Status: AC
  Administered 2022-11-02: 40 mg via INTRAVENOUS
  Filled 2022-11-02: qty 10

## 2022-11-02 MED ORDER — DIPHENHYDRAMINE HCL 50 MG/ML IJ SOLN
25.0000 mg | Freq: Once | INTRAMUSCULAR | Status: AC
  Administered 2022-11-02: 25 mg via INTRAVENOUS
  Filled 2022-11-02: qty 1

## 2022-11-02 MED ORDER — SODIUM CHLORIDE 0.9 % IV BOLUS
1000.0000 mL | Freq: Once | INTRAVENOUS | Status: AC
  Administered 2022-11-02: 1000 mL via INTRAVENOUS

## 2022-11-02 MED ORDER — CEPHALEXIN 500 MG PO CAPS: 500.0000 mg | ORAL_CAPSULE | Freq: Four times a day (QID) | ORAL | 0 refills | Status: DC

## 2022-11-02 MED ORDER — OXYCODONE-ACETAMINOPHEN 5-325 MG PO TABS
1.0000 | ORAL_TABLET | Freq: Once | ORAL | Status: AC
  Administered 2022-11-02: 1 via ORAL
  Filled 2022-11-02: qty 1

## 2022-11-02 MED ORDER — LACTATED RINGERS IV BOLUS
1000.0000 mL | Freq: Once | INTRAVENOUS | Status: AC
  Administered 2022-11-02: 1000 mL via INTRAVENOUS

## 2022-11-02 MED ORDER — DICYCLOMINE HCL 10 MG PO CAPS
10.0000 mg | ORAL_CAPSULE | Freq: Once | ORAL | Status: AC
  Administered 2022-11-02: 10 mg via ORAL
  Filled 2022-11-02: qty 1

## 2022-11-02 NOTE — ED Notes (Signed)
PT advised nothing to eat or drink until results received.

## 2022-11-02 NOTE — ED Triage Notes (Signed)
Pt arrives via POV from home with headache for the last 3 days. Pt also reports some nausea and upper abdominal pain over the last week.

## 2022-11-02 NOTE — ED Notes (Signed)
Pt on 5-lead

## 2022-11-02 NOTE — ED Provider Notes (Signed)
Care assumed from previous provider.  See note for full HPI  Plan to follow-up on UA given CT scan shows bladder wall thickening Physical Exam  BP 126/81 (BP Location: Left Arm)   Pulse 80   Temp 98 F (36.7 C) (Oral)   Resp 14   Ht 5\' 5"  (1.651 m)   Wt 99.8 kg   LMP  (LMP Unknown) Comment: Pt states she has not had a period since she was "22-something"  SpO2 99%   BMI 36.61 kg/m   Physical Exam Vitals and nursing note reviewed.  Constitutional:      General: She is not in acute distress.    Appearance: She is well-developed. She is not ill-appearing.  HENT:     Head: Atraumatic.  Eyes:     Pupils: Pupils are equal, round, and reactive to light.  Cardiovascular:     Rate and Rhythm: Normal rate.  Pulmonary:     Effort: No respiratory distress.  Abdominal:     General: There is no distension.  Musculoskeletal:        General: Normal range of motion.     Cervical back: Normal range of motion.  Skin:    General: Skin is warm and dry.  Neurological:     General: No focal deficit present.     Mental Status: She is alert.  Psychiatric:        Mood and Affect: Mood normal.     Procedures  Procedures Labs Reviewed  BASIC METABOLIC PANEL - Abnormal; Notable for the following components:      Result Value   Glucose, Bld 117 (*)    All other components within normal limits  CBC - Abnormal; Notable for the following components:   MCV 79.0 (*)    MCH 25.3 (*)    All other components within normal limits  HEPATIC FUNCTION PANEL - Abnormal; Notable for the following components:   Albumin 3.1 (*)    All other components within normal limits  URINALYSIS, ROUTINE W REFLEX MICROSCOPIC - Abnormal; Notable for the following components:   APPearance HAZY (*)    Nitrite POSITIVE (*)    Leukocytes,Ua TRACE (*)    Bacteria, UA FEW (*)    All other components within normal limits  LIPASE, BLOOD  POC URINE PREG, ED  TROPONIN I (HIGH SENSITIVITY)  TROPONIN I (HIGH SENSITIVITY)    CT ABDOMEN PELVIS WO CONTRAST  Result Date: 11/02/2022 CLINICAL DATA:  LLQ abdominal pain and epigastric pain EXAM: CT ABDOMEN AND PELVIS WITHOUT CONTRAST TECHNIQUE: Multidetector CT imaging of the abdomen and pelvis was performed following the standard protocol without IV contrast. RADIATION DOSE REDUCTION: This exam was performed according to the departmental dose-optimization program which includes automated exposure control, adjustment of the mA and/or kV according to patient size and/or use of iterative reconstruction technique. COMPARISON:  04/20/2021 FINDINGS: Lower chest: No acute abnormality. Hepatobiliary: No focal liver abnormality is seen. Status post cholecystectomy. No biliary dilatation. Pancreas: Unremarkable. No pancreatic ductal dilatation or surrounding inflammatory changes. Spleen: Normal in size without focal abnormality. Adrenals/Urinary Tract: Adrenal glands are unremarkable. Kidneys are normal, without renal calculi, solid lesion, or hydronephrosis. Urinary bladder is decompressed, limiting its evaluation. However, the wall of the urinary bladder does appear to be somewhat thickened and there is mild perivesicular fat stranding. Stomach/Bowel: Stomach is within normal limits. Air-filled, noninflamed appendix in the right lower quadrant (series 3, image 69). No evidence of bowel wall thickening, distention, or inflammatory changes. Moderate volume stool within  the colon. Vascular/Lymphatic: No significant vascular findings are present. No enlarged abdominal or pelvic lymph nodes. Reproductive: Uterus and bilateral adnexa are unremarkable. Other: No free fluid. No abdominopelvic fluid collection. No pneumoperitoneum. No abdominal wall hernia. Musculoskeletal: No acute or significant osseous findings. Joint space narrowing of both hips. IMPRESSION: 1. Urinary bladder is decompressed, limiting its evaluation. However, the wall of the urinary bladder does appear to be somewhat thickened and  there is mild perivesicular fat stranding. Correlate with urinalysis to exclude cystitis. 2. Otherwise, no acute abdominopelvic findings. 3. Moderate volume stool within the colon. Electronically Signed   By: Duanne Guess D.O.   On: 11/02/2022 13:11   DG Chest 2 View  Result Date: 11/02/2022 CLINICAL DATA:  42 year old female with history of chest pain. EXAM: CHEST - 2 VIEW COMPARISON:  Chest x-ray 12/14/2021. FINDINGS: Lung volumes are normal. New linear density in the left mid lung. No consolidative airspace disease. No pleural effusions. No pneumothorax. No pulmonary nodule or mass noted. Pulmonary vasculature and the cardiomediastinal silhouette are within normal limits. Surgical clips project over the right upper quadrant of the abdomen, likely from prior cholecystectomy. IMPRESSION: 1. Small amount of subsegmental atelectasis or scarring in the left mid lung. No radiographic evidence of acute cardiopulmonary disease otherwise noted. Electronically Signed   By: Trudie Reed M.D.   On: 11/02/2022 11:42    ED Course / MDM   Clinical Course as of 11/02/22 1948  Sat Nov 02, 2022  1549 CP, abd x months, bladder wall thickening, pending UA. No UA symptoms. No pelvic pain, dc concern for STD, torsion. Dc at UA [BH]    Clinical Course User Index [BH] Tariah Transue A, PA-C   Medical Decision Making Amount and/or Complexity of Data Reviewed External Data Reviewed: labs, radiology, ECG and notes. Labs: ordered. Decision-making details documented in ED Course. Radiology: ordered and independent interpretation performed. Decision-making details documented in ED Course. ECG/medicine tests: ordered and independent interpretation performed. Decision-making details documented in ED Course.  Risk OTC drugs. Prescription drug management. Parenteral controlled substances. Decision regarding hospitalization. Diagnosis or treatment significantly limited by social determinants of health.   UA  positive for UTI, started on antibiotics.  She is tolerating p.o. intake here..  Her chest and abdominal pain have been recurring for quite some time.  At this time low suspicion for acute ACS, PE, dissection, acute intra-abdominal etiology such as cholecystitis, obstruction, perforation, torsion, PID  DC home with symptomatic management       Payton Prinsen A, PA-C 11/02/22 1949    Pricilla Loveless, MD 11/02/22 2254

## 2022-11-02 NOTE — ED Provider Notes (Signed)
[place] EMERGENCY DEPARTMENT AT [facility] Provider Note   CSN: [id number] Arrival date & time: 11/02/22  1029     History  Chief Complaint  Patient presents with   Headache   Chest Pain    Sabrina Harding is a 42 y.o. female with past medical history anemia who presents to the ED with multiple complaints as below.  She says that she has not been able to be seen by a PCP in several years.   Patient complains of bilateral frontal headache with associated photophobia and phonophobia.  Has these intermittently chronically.  Associated nausea as well.  No fall or head injury.  She also complains of left-sided chest pain that has been intermittent as well for likely weeks to months.  Associated dyspnea that will wake her from her sleep.  No calf pain or leg swelling.  No history of DVT/PE.  No hormone use, recent travel, recent surgery, history of DVT.  No recent cough, congestion, fever, or known sick contacts.  She also states that in addition to the left-sided chest pain she has had both epigastric and left lower quadrant abdominal pain.  Previous abdominal surgeries include cholecystectomy and tubal ligation.  She denies vaginal discharge, bleeding, or concern for STDs.  States that over the last few days she has had 2-3 episodes of nonbloody emesis.  No diarrhea.  States that she has a known history of GERD but has never had symptoms as severe.  Not currently on any medications for this or any other daily prescription medications.  Does state that she frequently takes [facility] powders to help with her headaches and other associated pains.      Home Medications No daily medications  Allergies    Patient has no known allergies.    Review of Systems   Review of Systems  All other systems reviewed and are negative.   Physical Exam Updated Vital Signs BP 127/88   Pulse 90   Temp 98.2 F (36.8 C) (Oral)   Resp 15   Ht 5\' 5"  (1.651 m)   Wt 99.8 kg   LMP  (LMP  Unknown) Comment: Pt states she has not had a period since she was "22-something"  SpO2 100%   BMI 36.61 kg/m  Physical Exam Vitals and nursing note reviewed.  Constitutional:      General: She is not in acute distress.    Appearance: Normal appearance. She is not ill-appearing, toxic-appearing or diaphoretic.  HENT:     Head: Normocephalic and atraumatic.     Mouth/Throat:     Comments: Slightly dry mucous membranes Eyes:     Extraocular Movements: Extraocular movements intact.     Right eye: No nystagmus.     Left eye: No nystagmus.     Conjunctiva/sclera: Conjunctivae normal.     Pupils: Pupils are equal, round, and reactive to light.  Cardiovascular:     Rate and Rhythm: Normal rate and regular rhythm.     Heart sounds: No murmur heard. Pulmonary:     Effort: Pulmonary effort is normal. No respiratory distress.     Breath sounds: Normal breath sounds. No stridor. No wheezing, rhonchi or rales.  Chest:     Chest wall: No tenderness.  Abdominal:     General: Abdomen is flat. There is no distension.     Palpations: Abdomen is soft. There is no mass or pulsatile mass.     Tenderness: There is abdominal tenderness (mild epigastric, LLQ). There is no  right CVA tenderness, left CVA tenderness, guarding or rebound. Negative signs include Murphy's sign, Rovsing's sign and McBurney's sign.  Musculoskeletal:        General: Normal range of motion.     Cervical back: Normal range of motion and neck supple. No rigidity.     Right lower leg: No edema.     Left lower leg: No edema.  Skin:    General: Skin is warm and dry.     Capillary Refill: Capillary refill takes less than 2 seconds.  Neurological:     Mental Status: She is alert and oriented to person, place, and time.     GCS: GCS eye subscore is 4. GCS verbal subscore is 5. GCS motor subscore is 6.     Cranial Nerves: Cranial nerves 2-12 are intact. No cranial nerve deficit, dysarthria or facial asymmetry.     Sensory: Sensation  is intact.     Motor: Motor function is intact.     Coordination: Coordination is intact.  Psychiatric:        Mood and Affect: Mood is anxious.        Speech: Speech normal.        Behavior: Behavior is cooperative.     ED Results / Procedures / Treatments   Labs (all labs ordered are listed, but only abnormal results are displayed) Labs Reviewed  BASIC METABOLIC PANEL - Abnormal; Notable for the following components:      Result Value   Glucose, Bld 117 (*)    All other components within normal limits  CBC - Abnormal; Notable for the following components:   MCV 79.0 (*)    MCH 25.3 (*)    All other components within normal limits  HEPATIC FUNCTION PANEL - Abnormal; Notable for the following components:   Albumin 3.1 (*)    All other components within normal limits  LIPASE, BLOOD  URINALYSIS, ROUTINE W REFLEX MICROSCOPIC  POC URINE PREG, ED  TROPONIN I (HIGH SENSITIVITY)  TROPONIN I (HIGH SENSITIVITY)    EKG None  Radiology CT ABDOMEN PELVIS WO CONTRAST  Result Date: 11/02/2022 CLINICAL DATA:  LLQ abdominal pain and epigastric pain EXAM: CT ABDOMEN AND PELVIS WITHOUT CONTRAST TECHNIQUE: Multidetector CT imaging of the abdomen and pelvis was performed following the standard protocol without IV contrast. RADIATION DOSE REDUCTION: This exam was performed according to the departmental dose-optimization program which includes automated exposure control, adjustment of the mA and/or kV according to patient size and/or use of iterative reconstruction technique. COMPARISON:  04/20/2021 FINDINGS: Lower chest: No acute abnormality. Hepatobiliary: No focal liver abnormality is seen. Status post cholecystectomy. No biliary dilatation. Pancreas: Unremarkable. No pancreatic ductal dilatation or surrounding inflammatory changes. Spleen: Normal in size without focal abnormality. Adrenals/Urinary Tract: Adrenal glands are unremarkable. Kidneys are normal, without renal calculi, solid lesion, or  hydronephrosis. Urinary bladder is decompressed, limiting its evaluation. However, the wall of the urinary bladder does appear to be somewhat thickened and there is mild perivesicular fat stranding. Stomach/Bowel: Stomach is within normal limits. Air-filled, noninflamed appendix in the right lower quadrant (series 3, image 69). No evidence of bowel wall thickening, distention, or inflammatory changes. Moderate volume stool within the colon. Vascular/Lymphatic: No significant vascular findings are present. No enlarged abdominal or pelvic lymph nodes. Reproductive: Uterus and bilateral adnexa are unremarkable. Other: No free fluid. No abdominopelvic fluid collection. No pneumoperitoneum. No abdominal wall hernia. Musculoskeletal: No acute or significant osseous findings. Joint space narrowing of both hips. IMPRESSION: 1. Urinary bladder is  decompressed, limiting its evaluation. However, the wall of the urinary bladder does appear to be somewhat thickened and there is mild perivesicular fat stranding. Correlate with urinalysis to exclude cystitis. 2. Otherwise, no acute abdominopelvic findings. 3. Moderate volume stool within the colon. Electronically Signed   By: Duanne Guess D.O.   On: 11/02/2022 13:11   DG Chest 2 View  Result Date: 11/02/2022 CLINICAL DATA:  42 year old female with history of chest pain. EXAM: CHEST - 2 VIEW COMPARISON:  Chest x-ray 12/14/2021. FINDINGS: Lung volumes are normal. New linear density in the left mid lung. No consolidative airspace disease. No pleural effusions. No pneumothorax. No pulmonary nodule or mass noted. Pulmonary vasculature and the cardiomediastinal silhouette are within normal limits. Surgical clips project over the right upper quadrant of the abdomen, likely from prior cholecystectomy. IMPRESSION: 1. Small amount of subsegmental atelectasis or scarring in the left mid lung. No radiographic evidence of acute cardiopulmonary disease otherwise noted. Electronically  Signed   By: Trudie Reed M.D.   On: 11/02/2022 11:42    Procedures Procedures    Medications Ordered in ED Medications  ketorolac (TORADOL) 15 MG/ML injection 15 mg (15 mg Intravenous Given 11/02/22 1208)  metoCLOPramide (REGLAN) injection 10 mg (10 mg Intravenous Given 11/02/22 1208)  dexamethasone (DECADRON) injection 10 mg (10 mg Intravenous Given 11/02/22 1208)  diphenhydrAMINE (BENADRYL) injection 25 mg (25 mg Intravenous Given 11/02/22 1208)  lactated ringers bolus 1,000 mL (1,000 mLs Intravenous New Bag/Given 11/02/22 1207)  pantoprazole (PROTONIX) injection 40 mg (40 mg Intravenous Given 11/02/22 1208)  oxyCODONE-acetaminophen (PERCOCET/ROXICET) 5-325 MG per tablet 1 tablet (1 tablet Oral Given 11/02/22 1359)  dicyclomine (BENTYL) capsule 10 mg (10 mg Oral Given 11/02/22 1359)  sodium chloride 0.9 % bolus 1,000 mL (1,000 mLs Intravenous New Bag/Given 11/02/22 1419)    ED Course/ Medical Decision Making/ A&P Clinical Course as of 11/02/22 1550  Sat Nov 02, 2022  1549 CP, abd x months, bladder wall thickening, pending UA. No UA symptoms. No pelvic pain, dc concern for STD, torsion. Dc at UA [BH]    Clinical Course User Index [BH] Henderly, Britni A, PA-C                             Medical Decision Making Amount and/or Complexity of Data Reviewed Labs: ordered. Decision-making details documented in ED Course. Radiology: ordered. Decision-making details documented in ED Course. ECG/medicine tests: ordered. Decision-making details documented in ED Course.  Risk Prescription drug management.   Medical Decision Making:   Sabrina Harding is a 42 y.o. female who presented to the ED today with chest pain / abdominal pain / headache detailed above.     Complete initial physical exam performed, notably the patient  was neurologically intact.  She had mild epigastric and left lower quadrant abdominal tenderness but no rebound, guarding, or peritoneal signs.  No CVA tenderness.   Patient nontoxic-appearing.  Regular rate and rhythm, lungs clear to auscultation.  No signs respiratory distress.    Reviewed and confirmed nursing documentation for past medical history, family history, social history.    Initial Assessment:   With the patient's presentation of abdominal pain, differential diagnosis includes but is not limited to AAA, mesenteric ischemia, appendicitis, diverticulitis, DKA, gastritis, gastroenteritis, AMI, nephrolithiasis, pancreatitis, peritonitis, adrenal insufficiency, intestinal ischemia, constipation, UTI, SBO/LBO, splenic rupture, biliary disease, IBD, IBS, PUD, hepatitis, STD, ovarian/testicular torsion, electrolyte disturbance, DKA, dehydration, acute kidney injury, renal failure, cholecystitis, cholelithiasis, choledocholithiasis,  abdominal pain of  unknown etiology, pregnancy, incomplete abortion, septic abortion, threatened abortion, ectopic pregnancy, PID. The emergent differential diagnosis of chest pain includes: Acute coronary syndrome, pericarditis, aortic dissection, pulmonary embolism, tension pneumothorax, and esophageal rupture. Other urgent/non-acute considerations include, but are not limited to: chronic angina, aortic stenosis, cardiomyopathy, myocarditis, mitral valve prolapse, pulmonary hypertension, hypertrophic obstructive cardiomyopathy (HOCM), aortic insufficiency, right ventricular hypertrophy, pneumonia, pleuritis, bronchitis, pneumothorax, tumor, gastroesophageal reflux disease (GERD), esophageal spasm, Mallory-Weiss syndrome, peptic ulcer disease, biliary disease, pancreatitis, functional gastrointestinal pain, cervical or thoracic disk disease or arthritis, shoulder arthritis, costochondritis, subacromial bursitis, anxiety or panic attack, herpes zoster, breast disorders, chest wall tumors, thoracic outlet syndrome, mediastinitis. Emergent considerations for headache include subarachnoid hemorrhage, meningitis, temporal arteritis, glaucoma,  cerebral ischemia, carotid/vertebral dissection, intracranial tumor, Venous sinus thrombosis, carbon monoxide poisoning, acute or chronic subdural hemorrhage.  Other considerations include: Migraine, Cluster headache, Hypertension, Caffeine, alcohol, or drug withdrawal, Pseudotumor cerebri, Arteriovenous malformation, Head injury, Neurocysticercosis, Post-lumbar puncture, Preeclampsia, Tension headache, Sinusitis, Cervical arthritis, Refractive error causing strain, Dental abscess, Otitis media, Temporomandibular joint syndrome, Depression, Somatoform disorder (eg, somatization) Trigeminal neuralgia, Glossopharyngeal neuralgia.     Initial Plan:  Screening labs including CBC and Metabolic panel to evaluate for infectious or metabolic etiology of disease.  Lipase to evaluate for pancreatitis Urinalysis with reflex culture ordered to evaluate for UTI or relevant urologic/nephrologic pathology.  CT abd/pelvis to evaluate for intra-abdominal pathology EKG and troponin to evaluate for cardiac pathology Chest x-ray to assess for intrathoracic pathology Hepatic function panel to assess for liver disease Symptomatic treatment Objective evaluation as reviewed   Initial Study Results:   Laboratory  All laboratory results reviewed without evidence of clinically relevant pathology.    EKG EKG was reviewed independently. ST segments without concerns for elevations.   EKG: normal sinus rhythm.   Radiology:  All images reviewed independently. Agree with radiology report at this time.   CT ABDOMEN PELVIS WO CONTRAST  Result Date: 11/02/2022 CLINICAL DATA:  LLQ abdominal pain and epigastric pain EXAM: CT ABDOMEN AND PELVIS WITHOUT CONTRAST TECHNIQUE: Multidetector CT imaging of the abdomen and pelvis was performed following the standard protocol without IV contrast. RADIATION DOSE REDUCTION: This exam was performed according to the departmental dose-optimization program which includes automated exposure  control, adjustment of the mA and/or kV according to patient size and/or use of iterative reconstruction technique. COMPARISON:  04/20/2021 FINDINGS: Lower chest: No acute abnormality. Hepatobiliary: No focal liver abnormality is seen. Status post cholecystectomy. No biliary dilatation. Pancreas: Unremarkable. No pancreatic ductal dilatation or surrounding inflammatory changes. Spleen: Normal in size without focal abnormality. Adrenals/Urinary Tract: Adrenal glands are unremarkable. Kidneys are normal, without renal calculi, solid lesion, or hydronephrosis. Urinary bladder is decompressed, limiting its evaluation. However, the wall of the urinary bladder does appear to be somewhat thickened and there is mild perivesicular fat stranding. Stomach/Bowel: Stomach is within normal limits. Air-filled, noninflamed appendix in the right lower quadrant (series 3, image 69). No evidence of bowel wall thickening, distention, or inflammatory changes. Moderate volume stool within the colon. Vascular/Lymphatic: No significant vascular findings are present. No enlarged abdominal or pelvic lymph nodes. Reproductive: Uterus and bilateral adnexa are unremarkable. Other: No free fluid. No abdominopelvic fluid collection. No pneumoperitoneum. No abdominal wall hernia. Musculoskeletal: No acute or significant osseous findings. Joint space narrowing of both hips. IMPRESSION: 1. Urinary bladder is decompressed, limiting its evaluation. However, the wall of the urinary bladder does appear to be somewhat thickened and there is mild perivesicular fat stranding. Correlate with  urinalysis to exclude cystitis. 2. Otherwise, no acute abdominopelvic findings. 3. Moderate volume stool within the colon. Electronically Signed   By: Duanne Guess D.O.   On: 11/02/2022 13:11   DG Chest 2 View  Result Date: 11/02/2022 CLINICAL DATA:  42 year old female with history of chest pain. EXAM: CHEST - 2 VIEW COMPARISON:  Chest x-ray 12/14/2021.  FINDINGS: Lung volumes are normal. New linear density in the left mid lung. No consolidative airspace disease. No pleural effusions. No pneumothorax. No pulmonary nodule or mass noted. Pulmonary vasculature and the cardiomediastinal silhouette are within normal limits. Surgical clips project over the right upper quadrant of the abdomen, likely from prior cholecystectomy. IMPRESSION: 1. Small amount of subsegmental atelectasis or scarring in the left mid lung. No radiographic evidence of acute cardiopulmonary disease otherwise noted. Electronically Signed   By: Trudie Reed M.D.   On: 11/02/2022 11:42      Final Assessment and Plan:   42 year old female presents to the ED with multiple complaints as above, most notably headache, chest pain, and abdominal pain.  All symptoms seem to have somewhat of a chronic nature.  Patient not currently established with outpatient primary care team.  She is neurologically intact.  Nontoxic-appearing.  No acute distress.  Lungs clear to auscultation.  No respiratory distress.  Vital signs reassuring.  Workup initiated as above for further assessment.  She did have epigastric as well as left lower quadrant abdominal tenderness.  CT abdomen pelvis obtained due to this.  It showed possible bladder wall thickening and moderate constipation but no signs of an acute abdomen or other infectious process.  Troponin normal x 2.  No acute changes on EKG.  Chest x-ray normal.  Low suspicion for ACS.  Patient's headache much improved following headache cocktail and fluids.  With findings of bladder wall thickening on CT scan, pending urinalysis for correlation.  Patient has no dysuria, no hematuria, no fever.  Will treat if UA is positive.  Care transition to oncoming PA-C, Britni Henderly, pending UA and anticipated discharge home.    Clinical Impression:  1. Slow transit constipation   2. Atypical chest pain      Data Unavailable           Final Clinical Impression(s) /  ED Diagnoses Final diagnoses:  Slow transit constipation  Atypical chest pain    Rx / DC Orders ED Discharge Orders     None         Tonette Lederer, PA-C 11/02/22 1550    Arby Barrette, MD 11/19/22 1408

## 2022-11-02 NOTE — Discharge Instructions (Addendum)
Mix 4 doses of miralax into 32 oz of a sports drink like Gatorade. Drink over 2 hours or until you have a good bowel movement. You may repeat once more if needed.

## 2022-11-02 NOTE — ED Notes (Signed)
Patient transported to CT 

## 2022-11-02 NOTE — ED Notes (Signed)
Hepatic function panel added by lab

## 2022-11-02 NOTE — ED Notes (Signed)
Pt taken to CT scan.

## 2023-01-25 ENCOUNTER — Encounter (HOSPITAL_COMMUNITY): Payer: Self-pay | Admitting: *Deleted

## 2023-01-25 ENCOUNTER — Other Ambulatory Visit: Payer: Self-pay

## 2023-01-25 ENCOUNTER — Emergency Department (HOSPITAL_COMMUNITY)
Admission: EM | Admit: 2023-01-25 | Discharge: 2023-01-25 | Disposition: A | Discharge status: Home / Self Care | Payer: Self-pay | Attending: Emergency Medicine | Admitting: Emergency Medicine

## 2023-01-25 DIAGNOSIS — T23202A Burn of second degree of left hand, unspecified site, initial encounter: Secondary | ICD-10-CM

## 2023-01-25 DIAGNOSIS — X088XXA Exposure to other specified smoke, fire and flames, initial encounter: Secondary | ICD-10-CM | POA: Insufficient documentation

## 2023-01-25 DIAGNOSIS — T23102A Burn of first degree of left hand, unspecified site, initial encounter: Secondary | ICD-10-CM

## 2023-01-25 DIAGNOSIS — T23232A Burn of second degree of multiple left fingers (nail), not including thumb, initial encounter: Secondary | ICD-10-CM | POA: Insufficient documentation

## 2023-01-25 DIAGNOSIS — T310 Burns involving less than 10% of body surface: Secondary | ICD-10-CM | POA: Insufficient documentation

## 2023-01-25 DIAGNOSIS — Z23 Encounter for immunization: Secondary | ICD-10-CM | POA: Insufficient documentation

## 2023-01-25 MED ORDER — IBUPROFEN 600 MG PO TABS: 600.0000 mg | ORAL_TABLET | Freq: Four times a day (QID) | ORAL | 0 refills | Status: DC | PRN

## 2023-01-25 MED ORDER — TETANUS-DIPHTH-ACELL PERTUSSIS 5-2.5-18.5 LF-MCG/0.5 IM SUSY
0.5000 mL | PREFILLED_SYRINGE | Freq: Once | INTRAMUSCULAR | Status: AC
  Administered 2023-01-25: 0.5 mL via INTRAMUSCULAR
  Filled 2023-01-25: qty 0.5

## 2023-01-25 MED ORDER — OXYCODONE-ACETAMINOPHEN 5-325 MG PO TABS
1.0000 | ORAL_TABLET | Freq: Once | ORAL | Status: AC
  Administered 2023-01-25: 1 via ORAL
  Filled 2023-01-25: qty 1

## 2023-01-25 MED ORDER — SILVER SULFADIAZINE 1 % EX CREA
TOPICAL_CREAM | Freq: Once | CUTANEOUS | Status: AC
  Filled 2023-01-25: qty 85

## 2023-01-25 MED ORDER — ACETAMINOPHEN 500 MG PO TABS: 500.0000 mg | ORAL_TABLET | Freq: Four times a day (QID) | ORAL | 0 refills | Status: AC | PRN

## 2023-01-25 MED ORDER — OXYCODONE-ACETAMINOPHEN 5-325 MG PO TABS
1.0000 | ORAL_TABLET | Freq: Three times a day (TID) | ORAL | 0 refills | Status: DC | PRN
Start: 2023-01-25 — End: 2023-08-03

## 2023-01-25 MED ORDER — BACITRACIN ZINC 500 UNIT/GM EX OINT: TOPICAL_OINTMENT | Freq: Two times a day (BID) | CUTANEOUS | Status: DC

## 2023-01-25 MED ORDER — BACITRACIN ZINC 500 UNIT/GM EX OINT: 1.0000 | TOPICAL_OINTMENT | Freq: Two times a day (BID) | CUTANEOUS | 0 refills | Status: DC
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
## 2023-04-09 ENCOUNTER — Emergency Department (HOSPITAL_COMMUNITY)
Admission: EM | Admit: 2023-04-09 | Discharge: 2023-04-09 | Disposition: A | Discharge status: Home / Self Care | Payer: Self-pay | Attending: Emergency Medicine | Admitting: Emergency Medicine

## 2023-04-09 ENCOUNTER — Emergency Department (HOSPITAL_COMMUNITY): Payer: Self-pay

## 2023-04-09 ENCOUNTER — Other Ambulatory Visit: Payer: Self-pay

## 2023-04-09 DIAGNOSIS — J40 Bronchitis, not specified as acute or chronic: Secondary | ICD-10-CM

## 2023-04-09 DIAGNOSIS — R519 Headache, unspecified: Secondary | ICD-10-CM | POA: Insufficient documentation

## 2023-04-09 DIAGNOSIS — Z79899 Other long term (current) drug therapy: Secondary | ICD-10-CM | POA: Insufficient documentation

## 2023-04-09 DIAGNOSIS — R1110 Vomiting, unspecified: Secondary | ICD-10-CM | POA: Insufficient documentation

## 2023-04-09 DIAGNOSIS — Z20822 Contact with and (suspected) exposure to covid-19: Secondary | ICD-10-CM | POA: Insufficient documentation

## 2023-04-09 DIAGNOSIS — R059 Cough, unspecified: Secondary | ICD-10-CM | POA: Insufficient documentation

## 2023-04-09 DIAGNOSIS — J029 Acute pharyngitis, unspecified: Secondary | ICD-10-CM | POA: Insufficient documentation

## 2023-04-09 DIAGNOSIS — R0981 Nasal congestion: Secondary | ICD-10-CM | POA: Insufficient documentation

## 2023-04-09 LAB — RESP PANEL BY RT-PCR (RSV, FLU A&B, COVID)  RVPGX2
Influenza A by PCR: NEGATIVE
Influenza B by PCR: NEGATIVE
Resp Syncytial Virus by PCR: NEGATIVE
SARS Coronavirus 2 by RT PCR: NEGATIVE

## 2023-04-09 LAB — GROUP A STREP BY PCR: Group A Strep by PCR: NOT DETECTED

## 2023-04-09 MED ORDER — IPRATROPIUM-ALBUTEROL 0.5-2.5 (3) MG/3ML IN SOLN
3.0000 mL | Freq: Once | RESPIRATORY_TRACT | Status: AC
  Administered 2023-04-09: 3 mL via RESPIRATORY_TRACT
  Filled 2023-04-09: qty 3

## 2023-04-09 MED ORDER — AEROCHAMBER PLUS FLO-VU LARGE MISC
1.0000 | Freq: Once | Status: AC
  Administered 2023-04-09: 1

## 2023-04-09 MED ORDER — BENZONATATE 100 MG PO CAPS: 100.0000 mg | ORAL_CAPSULE | Freq: Three times a day (TID) | ORAL | 0 refills | Status: DC | PRN

## 2023-04-09 MED ORDER — LIDOCAINE VISCOUS HCL 2 % MT SOLN
15.0000 mL | Freq: Once | OROMUCOSAL | Status: DC
  Filled 2023-04-09: qty 15

## 2023-04-09 MED ORDER — MAGNESIUM SULFATE 2 GM/50ML IV SOLN
2.0000 g | Freq: Once | INTRAVENOUS | Status: AC
  Administered 2023-04-09: 2 g via INTRAVENOUS
  Filled 2023-04-09: qty 50

## 2023-04-09 MED ORDER — METHYLPREDNISOLONE SODIUM SUCC 125 MG IJ SOLR
125.0000 mg | Freq: Once | INTRAMUSCULAR | Status: AC
  Administered 2023-04-09: 125 mg via INTRAVENOUS
  Filled 2023-04-09: qty 2

## 2023-04-09 MED ORDER — PREDNISONE 20 MG PO TABS: 60.0000 mg | ORAL_TABLET | Freq: Once | ORAL | Status: DC

## 2023-04-09 MED ORDER — PREDNISONE 20 MG PO TABS: 40.0000 mg | ORAL_TABLET | Freq: Every day | ORAL | 0 refills | Status: AC

## 2023-04-09 MED ORDER — ALBUTEROL SULFATE (2.5 MG/3ML) 0.083% IN NEBU
20.0000 mg/h | INHALATION_SOLUTION | Freq: Once | RESPIRATORY_TRACT | Status: AC
  Administered 2023-04-09: 20 mg/h via RESPIRATORY_TRACT
  Filled 2023-04-09: qty 24

## 2023-04-09 MED ORDER — ALBUTEROL SULFATE HFA 108 (90 BASE) MCG/ACT IN AERS
1.0000 | INHALATION_SPRAY | Freq: Once | RESPIRATORY_TRACT | Status: AC
  Administered 2023-04-09: 1 via RESPIRATORY_TRACT
  Filled 2023-04-09: qty 6.7

## 2023-04-09 MED ORDER — AEROCHAMBER PLUS FLO-VU MEDIUM MISC
1.0000 | Freq: Once | Status: DC
Start: 2023-04-09 — End: 2023-04-09

## 2023-04-09 NOTE — ED Provider Notes (Signed)
 Physical Exam  BP (!) 118/93   Pulse 95   Temp 99 F (37.2 C) (Oral)   Resp 18   SpO2 96%   Physical Exam Vitals and nursing note reviewed.  Constitutional:      General: She is not in acute distress.    Appearance: She is not toxic-appearing.  HENT:     Nose: Congestion present.     Mouth/Throat:     Mouth: Mucous membranes are moist.     Comments: No pharyngeal erythema, edema, or exudate noted.  Uvula midline.  Airway patent.  Hoarse voice. Cardiovascular:     Rate and Rhythm: Normal rate.  Pulmonary:     Effort: Pulmonary effort is normal. No respiratory distress.     Comments: Significant prolonged expiratory phase with significant expiratory wheezing heard throughout all lung fields.  No stridor.  Satting well on room air without increased work of breathing. Skin:    General: Skin is warm and dry.  Neurological:     Mental Status: She is alert.     Procedures  Procedures  ED Course / MDM    Medical Decision Making Amount and/or Complexity of Data Reviewed Radiology: ordered.  Risk Prescription drug management.   Accepted handoff at shift change from [facility]. Please see prior provider note for more detail.   Briefly: Patient is 43 y.o. F presents the emerged from today for evaluation of coughing and wheezing.  She reports some frontal sinus pressure as well as some nasal congestion as well.  Nursing note and previous redness mention sore throat however I confirmed patient that she is not feeling any sore throat.  She denies any chest pain.  DDX: concern for viral illness  Plan: Follow-up on labs  I independently reviewed and interpreted patient's labs.  Patient is negative for COVID, flu, RSV, strep.  CXR no acute cardiopulmonary process. Per radiologist's interpretation.   On my evaluation of the patient, she has significant audible wheezing on exam.  She is a prolonged expiratory phase with expiratory wheezing throughout all lung fields.  I have  changed her p.o. prednisone  to IV Solu-Medrol  as well as giving her some IV magnesium .  Given her DuoNeb.  On reevaluation, the patient reports that she is feeling better.  Still has significant expiratory wheezing with prolonged Tory phase however duplicates improved since previous.  Will order her additional DuoNeb.  On reevaluation, patient's wheezing has improved however still has expiratory wheezing phase however wheezing has improved.  Patient again reports that she is feeling better.  Will start her on hour-long albuterol  treatment.  On reevaluation, patient is not having any wheezing.  She reports that she feels significantly better.  Still having hoarse voice, likely having some bronchitis/laryngitis.  She is not having any stridor.  She is satting well room air without increased work of breathing.  She has some mild tachycardia however is likely due to her repeated albuterol  treatments.  She is controlling secretions been eating and drinking without difficulty.  Will send her home with an albuterol  inhaler, prednisone , and some Tessalon  Perles.  I given her a work note as well as an ration for a primary care doctor.  We discussed return precautions of red flag symptoms.  Patient verbalized understanding and agrees to the plan.  Patient is stable and being discharged home in good condition.  DG Chest 2 View Result Date: 04/09/2023 CLINICAL DATA:  cough EXAM: CHEST - 2 VIEW COMPARISON:  Chest x-ray 11/02/2022, CT chest 11/22/2005 FINDINGS:  The heart and mediastinal contours are within normal limits. No focal consolidation. No pulmonary edema. No pleural effusion. No pneumothorax. No acute osseous abnormality. IMPRESSION: No active cardiopulmonary disease. Electronically Signed   By: Morgane  Naveau M.D.   On: 04/09/2023 16:50       Bernis Ernst, PA-C 04/10/23 0015    Laurice Maude BROCKS, MD 04/10/23 0130

## 2023-04-09 NOTE — ED Provider Notes (Signed)
 [place] EMERGENCY DEPARTMENT AT [facility] Provider Note   CSN: [id number] Arrival date & time: 04/09/23  1030     History  Chief Complaint  Patient presents with   Sore Throat   Hoarse   Nasal Congestion    Sabrina Harding is a 43 y.o. female who presents with 2 days of congestion, sore throat, nonproductive cough, and headache.  Endorses 1 episode of vomiting.  No abdominal pain or chest pain.    Sore Throat       Home Medications Prior to Admission medications   Medication Sig Start Date End Date Taking? Authorizing Provider  acetaminophen  (TYLENOL ) 500 MG tablet Take 1 tablet (500 mg total) by mouth every 6 (six) hours as needed. 01/25/23   Charlyn Sora, MD  bacitracin  ointment Apply 1 Application topically 2 (two) times daily. 01/25/23   Charlyn Sora, MD  cephALEXin  (KEFLEX ) 500 MG capsule Take 1 capsule (500 mg total) by mouth 4 (four) times daily. 11/02/22   Henderly, Britni A, PA-C  cetirizine  (ZYRTEC  ALLERGY) 10 MG tablet Take 1 tablet (10 mg total) by mouth daily. 12/14/21   Cockerham, Alicia M, PA-C  hydrOXYzine  (ATARAX /VISTARIL ) 25 MG tablet Take 1 tablet (25 mg total) by mouth 3 (three) times daily as needed for anxiety. Patient not taking: Reported on 12/14/2021 09/21/20   Tory Velia FALCON, NP  ibuprofen  (ADVIL ) 600 MG tablet Take 1 tablet (600 mg total) by mouth every 6 (six) hours as needed. 01/25/23   Charlyn Sora, MD  ondansetron  (ZOFRAN -ODT) 4 MG disintegrating tablet Take 1 tablet (4 mg total) by mouth every 8 (eight) hours as needed for nausea or vomiting. Patient not taking: Reported on 12/14/2021 04/21/21   Zackowski, Scott, MD  Oxcarbazepine  (TRILEPTAL ) 300 MG tablet Take 1 tablet (300 mg total) by mouth daily. Patient not taking: Reported on 12/14/2021 09/22/20   Tory Velia FALCON, NP  oxyCODONE -acetaminophen  (PERCOCET/ROXICET) 5-325 MG tablet Take 1 tablet by mouth every 8 (eight) hours as needed for severe pain (pain score 7-10).  01/25/23   Charlyn Sora, MD  sertraline  (ZOLOFT ) 50 MG tablet Take 1 tablet (50 mg total) by mouth daily. Patient not taking: Reported on 12/14/2021 09/22/20   Tory Velia FALCON, NP      Allergies    Patient has no known allergies.    Review of Systems   Review of Systems  HENT:  Positive for sore throat.     Physical Exam Updated Vital Signs BP (!) 118/93   Pulse 95   Temp 99 F (37.2 C) (Oral)   Resp 18   SpO2 96%  Physical Exam Vitals and nursing note reviewed.  Constitutional:      General: She is not in acute distress.    Appearance: She is well-developed.     Comments: Speaks with a hoarse voice  HENT:     Head: Normocephalic and atraumatic.     Nose: Congestion present.     Mouth/Throat:     Mouth: No oral lesions.     Pharynx: Uvula midline. No pharyngeal swelling, oropharyngeal exudate, posterior oropharyngeal erythema or uvula swelling.     Tonsils: No tonsillar exudate or tonsillar abscesses.  Eyes:     Conjunctiva/sclera: Conjunctivae normal.  Cardiovascular:     Rate and Rhythm: Normal rate and regular rhythm.     Heart sounds: No murmur heard. Pulmonary:     Effort: Pulmonary effort is normal. No respiratory distress.     Breath sounds: Normal breath sounds.  Comments: Diffuse expiratory wheeze Abdominal:     Palpations: Abdomen is soft.     Tenderness: There is no abdominal tenderness.  Musculoskeletal:        General: No swelling.     Cervical back: Neck supple.  Lymphadenopathy:     Cervical: Cervical adenopathy present.  Skin:    General: Skin is warm and dry.     Capillary Refill: Capillary refill takes less than 2 seconds.  Neurological:     Mental Status: She is alert.  Psychiatric:        Mood and Affect: Mood normal.     ED Results / Procedures / Treatments   Labs (all labs ordered are listed, but only abnormal results are displayed) Labs Reviewed  RESP PANEL BY RT-PCR (RSV, FLU A&B, COVID)  [id number]  GROUP A STREP BY PCR     EKG None  Radiology No results found.  Procedures Procedures    Medications Ordered in ED Medications  methylPREDNISolone  sodium succinate (SOLU-MEDROL ) 125 mg/2 mL injection 125 mg (has no administration in time range)  ipratropium-albuterol  (DUONEB) 0.5-2.5 (3) MG/3ML nebulizer solution 3 mL (has no administration in time range)    ED Course/ Medical Decision Making/ A&P                                 Medical Decision Making  This patient presents to the ED with chief complaint(s) of sore throat, cough, congestion.  The complaint involves an extensive differential diagnosis and also carries with it a high risk of complications and morbidity.   pertinent past medical history as listed in HPI  The differential diagnosis includes  Pharyngitis, Ludwig's angina, retropharyngeal abscess, peritonsillar abscess, mono, viral URI The initial plan is to  Respiratory panel ordered in triage, will add on strep test and prednisone  Additional history obtained: Records reviewed previous admission documents  Initial Assessment:   Patient is nontoxic-appearing presenting sore throat, congestion.  She has anterior bilateral cervical lymphadenopathy on exam, no obvious exudate appreciated, no evidence of peritonsillar or retropharyngeal abscess, no trismus or pooling of secretions.  She has wheezing on exam without tachypnea or increased respiratory effort.  Saturation is 96%.  No history of asthma or COPD.  Independent ECG interpretation:  None  Independent labs interpretation:  The following labs were independently interpreted:  Respiratory panel negative  Independent visualization and interpretation of imaging: none  Treatment and Reassessment: Patient given prednisone  60 mg p.o. following first assessment  Consultations obtained:   None  Disposition:   Patient disposition is pending workup.  Signout given to PA [employer].   Social Determinants of Health:    none  This note was dictated with voice recognition software.  Despite best efforts at proofreading, errors may have occurred which can change the documentation meaning.          Final Clinical Impression(s) / ED Diagnoses Final diagnoses:  Sore throat    Rx / DC Orders ED Discharge Orders     None         Donnajean Lynwood DEL, PA-C 04/09/23 1534    Emil Share, DO 04/15/23 [phone]

## 2023-04-09 NOTE — ED Notes (Signed)
 RN provided pt w/ education regarding use of rescue inhalator and spacer. Pt verbalized and demonstrated understanding of both.

## 2023-04-09 NOTE — ED Notes (Signed)
 Patient transported to x-ray. ?

## 2023-04-09 NOTE — Discharge Instructions (Addendum)
 You were seen in the emerged from today for evaluation of your cough and cold symptoms.  I likely think that you are having a viral illness that is caused you to have some bronchitis.  I am glad that you are feeling better.  I am sending you home with 2 medications.  One is called prednisone  that you will take daily for the next 5 days.  The other is called Tessalon  which you can take as needed for cough.  If the Tessalon  Perles do not help you, you can take Robitussin instead.  You can use the albuterol  inhaler given to you today to help with your wheezing and cough as well.  Use every 4-6 hours as needed.  I given you a work note as well.  Please make sure you are resting.  If you have any concerns, new or worsening symptoms, please return to your nearest emergency room for evaluation.  I have listed information for a primary care doctor into the discharge report.  Please call to schedule appointment.  Contact a doctor if: Your symptoms do not get better in 2 weeks. You have trouble coughing up the mucus. Your cough keeps you awake at night. You have a fever. Get help right away if: You cough up blood. You have chest pain. You have very bad shortness of breath. You faint or keep feeling like you are going to faint. You have a very bad headache. Your fever or chills get worse. These symptoms may be an emergency. Get help right away. Call your local emergency services (911 in the [place] Do not wait to see if the symptoms will go away. Do not drive yourself to the hospital.

## 2023-04-09 NOTE — ED Triage Notes (Signed)
 Pt with two days of nasal congestion, sore throat, hoarse voice, cough, and headache.

## 2023-08-03 ENCOUNTER — Encounter (HOSPITAL_COMMUNITY): Payer: Self-pay

## 2023-08-03 ENCOUNTER — Ambulatory Visit (HOSPITAL_COMMUNITY)
Admission: EM | Admit: 2023-08-03 | Discharge: 2023-08-03 | Disposition: A | Discharge status: Home / Self Care | Payer: Self-pay | Attending: Emergency Medicine | Admitting: Emergency Medicine

## 2023-08-03 ENCOUNTER — Other Ambulatory Visit: Payer: Self-pay

## 2023-08-03 DIAGNOSIS — M62838 Other muscle spasm: Secondary | ICD-10-CM

## 2023-08-03 DIAGNOSIS — M542 Cervicalgia: Secondary | ICD-10-CM

## 2023-08-03 LAB — POCT FASTING CBG KUC MANUAL ENTRY: POCT Glucose (KUC): 86 mg/dL (ref 70–99)

## 2023-08-03 MED ORDER — METHOCARBAMOL 500 MG PO TABS: 500.0000 mg | ORAL_TABLET | Freq: Four times a day (QID) | ORAL | 0 refills | Status: DC

## 2023-08-03 NOTE — ED Triage Notes (Signed)
 Pt states that Thursday she started having a migraine, body aches, chest pain, and fatigue. Pt c/o of pain of 7/0. Pt tried OTC Tylenol , Ibuprofen , BC Powder with no relief.

## 2023-08-03 NOTE — Discharge Instructions (Signed)
 Schedule appointment for primary care for complete physical.

## 2023-08-03 NOTE — ED Provider Notes (Signed)
 [facility]    CSN: [id number] Arrival date & time: 08/03/23  1353      History   Chief Complaint Chief Complaint  Patient presents with   Migraine    fatigu   Fatigue   Generalized Body Aches   Chest Pain    HPI Sabrina Harding is a 43 y.o. female.   Patient has multiple complaints.  Patient reports she has been experiencing a headache and bodyaches.  Patient reports that she has pain in her neck.  Patient reports she has had pain in her neck for an extended.  Of time.  She reports the pain becomes worse when she is at work.  She reports having her coworkers tried to rub the area to help with the discomfort.  Patient states that she feels like she has something wrong.  Patient denies any history of any serious medical problems.  She has had a cholecystectomy and a tubal ligation.  Patient denies having any fever or chills she has not any chest pain she has not had any abdominal pain she denies any burning with urination.   Migraine Associated symptoms include chest pain.  Chest Pain   Past Medical History:  Diagnosis Date   Anemia    Gall stones    Gonorrhea    Headache(784.0)    Heart murmur    childhood   Trichomonas contact     Patient Active Problem List   Diagnosis Date Noted   MDD (major depressive disorder) 09/17/2020   Severe major depression (HCC) 09/16/2020   Oral herpes 01/10/2016   Pelvic inflammatory disease (PID) 01/07/2016   Gonorrhea in female 01/07/2016   Pyelonephritis 01/06/2016    Past Surgical History:  Procedure Laterality Date   CHOLECYSTECTOMY  10/17/2011   Procedure: LAPAROSCOPIC CHOLECYSTECTOMY;  Surgeon: Rogena Class, MD;  Location: [facility];  Service: General;  Laterality: N/A;   TONSILLECTOMY     TUBAL LIGATION      OB History     Gravida  6   Para  0   Term  0   Preterm      AB  3   Living  3      SAB  3   IAB      Ectopic      Multiple      Live Births  3            Home  Medications    Prior to Admission medications   Medication Sig Start Date End Date Taking? Authorizing Provider  methocarbamol  (ROBAXIN ) 500 MG tablet Take 1 tablet (500 mg total) by mouth 4 (four) times daily. 08/03/23  Yes Sanyiah Kanzler K, PA-C  acetaminophen  (TYLENOL ) 500 MG tablet Take 1 tablet (500 mg total) by mouth every 6 (six) hours as needed. 01/25/23   Deatra Face, MD  bacitracin  ointment Apply 1 Application topically 2 (two) times daily. 01/25/23   Deatra Face, MD  benzonatate  (TESSALON ) 100 MG capsule Take 1 capsule (100 mg total) by mouth 3 (three) times daily as needed for cough. 04/09/23   Spence Dux, PA-C  cephALEXin  (KEFLEX ) 500 MG capsule Take 1 capsule (500 mg total) by mouth 4 (four) times daily. 11/02/22   Henderly, Britni A, PA-C  cetirizine  (ZYRTEC  ALLERGY) 10 MG tablet Take 1 tablet (10 mg total) by mouth daily. 12/14/21   Cockerham, Alicia M, PA-C  ibuprofen  (ADVIL ) 600 MG tablet Take 1 tablet (600 mg total) by mouth every 6 (six) hours as  needed. 01/25/23   Deatra Face, MD    Family History Family History  Problem Relation Age of Onset   Hypertension Father    Healthy Mother     Social History Social History   Tobacco Use   Smoking status: Every Day    Current packs/day: 1.00    Average packs/day: 1 pack/day for 8.0 years (8.0 ttl pk-yrs)    Types: Cigarettes   Smokeless tobacco: Never  Vaping Use   Vaping status: Never Used  Substance Use Topics   Alcohol use: Yes    Alcohol/week: 0.0 standard drinks of alcohol    Comment: occasional   Drug use: No     Allergies   Patient has no known allergies.   Review of Systems Review of Systems  Cardiovascular:  Positive for chest pain.  All other systems reviewed and are negative.    Physical Exam Triage Vital Signs ED Triage Vitals  Encounter Vitals Group     BP 08/03/23 1513 (!) 132/94     Systolic BP Percentile --      Diastolic BP Percentile --      Pulse Rate 08/03/23 1513 78      Resp --      Temp 08/03/23 1513 98.4 F (36.9 C)     Temp Source 08/03/23 1513 Oral     SpO2 08/03/23 1513 97 %     Weight --      Height --      Head Circumference --      Peak Flow --      Pain Score 08/03/23 1510 7     Pain Loc --      Pain Education --      Exclude from Growth Chart --    No data found.  Updated Vital Signs BP (!) 132/94 (BP Location: Right Arm)   Pulse 78   Temp 98.4 F (36.9 C) (Oral)   SpO2 97%   Visual Acuity Right Eye Distance:   Left Eye Distance:   Bilateral Distance:    Right Eye Near:   Left Eye Near:    Bilateral Near:     Physical Exam Vitals and nursing note reviewed.  Constitutional:      Appearance: She is well-developed.  HENT:     Head: Normocephalic.  Cardiovascular:     Rate and Rhythm: Normal rate.     Heart sounds: Normal heart sounds.  Pulmonary:     Effort: Pulmonary effort is normal.  Abdominal:     General: There is no distension.     Palpations: Abdomen is soft.  Musculoskeletal:        General: Normal range of motion.     Cervical back: Normal range of motion.  Skin:    General: Skin is warm.  Neurological:     General: No focal deficit present.     Mental Status: She is alert and oriented to person, place, and time.      UC Treatments / Results  Labs (all labs ordered are listed, but only abnormal results are displayed) Labs Reviewed  POCT FASTING CBG [facility] MANUAL ENTRY - Normal    EKG   Radiology No results found.  Procedures Procedures (including critical care time)  Medications Ordered in UC Medications - No data to display  Initial Impression / Assessment and Plan / UC Course  I have reviewed the triage vital signs and the nursing notes.  Pertinent labs & imaging results that were available during my care  of the patient were reviewed by me and considered in my medical decision making (see chart for details).     I obtained a CBG which is normal.  Patient symptoms seem mostly related  to muscle spasm in her upper back lower neck and in the trapezius area.  I will try patient on a muscle relaxer.  Patient is given a note to be out of work she is advised to rest I have advised her that she needs a complete physical with laboratory evaluation by primary care physician.  She is advised to follow-up with primary care.  Patient is discharged in stable condition Final Clinical Impressions(s) / UC Diagnoses   Final diagnoses:  Neck pain  Trapezius muscle spasm     Discharge Instructions      Schedule appointment for primary care for complete physical.     ED Prescriptions     Medication Sig Dispense Auth. Provider   methocarbamol  (ROBAXIN ) 500 MG tablet Take 1 tablet (500 mg total) by mouth 4 (four) times daily. 20 tablet Jaylianna Tatlock K, PA-C      PDMP not reviewed this encounter. An After Visit Summary was printed and given to the patient.       Sandi Crosby, PA-C 08/03/23 1635

## 2023-12-23 ENCOUNTER — Ambulatory Visit (HOSPITAL_COMMUNITY)
Admission: EM | Admit: 2023-12-23 | Discharge: 2023-12-23 | Disposition: A | Discharge status: Home / Self Care | Payer: Self-pay | Attending: Emergency Medicine | Admitting: Emergency Medicine

## 2023-12-23 ENCOUNTER — Encounter (HOSPITAL_COMMUNITY): Payer: Self-pay

## 2023-12-23 DIAGNOSIS — B349 Viral infection, unspecified: Secondary | ICD-10-CM

## 2023-12-23 LAB — POC COVID19/FLU A&B COMBO
Covid Antigen, POC: NEGATIVE
Influenza A Antigen, POC: NEGATIVE
Influenza B Antigen, POC: NEGATIVE

## 2023-12-23 MED ORDER — ACETAMINOPHEN 325 MG PO TABS
650.0000 mg | ORAL_TABLET | Freq: Once | ORAL | Status: AC
  Administered 2023-12-23: 650 mg via ORAL

## 2023-12-23 MED ORDER — ACETAMINOPHEN 325 MG PO TABS
ORAL_TABLET | ORAL | Status: AC
  Filled 2023-12-23: qty 2

## 2023-12-23 NOTE — ED Triage Notes (Signed)
 Fever,chills,fatigue and weakness started yesterday. Patient took Benadryl .

## 2023-12-23 NOTE — Discharge Instructions (Signed)
 Most likely you have a viral illness: no antibiotic is indicated at this time, May treat with OTC meds of choice(tylenol ,ibuprofen , chloraseptic throat lozenges, etc). Make sure to drink plenty of fluids to stay hydrated(gatorade, water, popsicles,jello,etc), avoid caffeine  products. Follow up with PCP. If you have new or worsening issues go to Er for further evaluation(chest pain,shortness of breath,palpitations, etc)

## 2023-12-23 NOTE — ED Provider Notes (Signed)
 [facility]    CSN: [id number] Arrival date & time: 12/23/23  9074      History   Chief Complaint Chief Complaint  Patient presents with   Chills   Fever   Fatigue    HPI Sabrina Harding is a 43 y.o. female.   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  Pt took Benadryl  for symptoms. 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  The history is provided by the patient. No language interpreter was used.    Past Medical History:  Diagnosis Date   Anemia    Gall stones    Gonorrhea    Headache(784.0)    Heart murmur    childhood   Trichomonas contact     Patient Active Problem List   Diagnosis Date Noted   Nonspecific syndrome suggestive of viral illness 12/23/2023   MDD (major depressive disorder) 09/17/2020   Severe major depression (HCC) 09/16/2020   Oral herpes 01/10/2016   Pelvic inflammatory disease (PID) 01/07/2016   Gonorrhea in female 01/07/2016   Pyelonephritis 01/06/2016    Past Surgical History:  Procedure Laterality Date   CHOLECYSTECTOMY  10/17/2011   Procedure: LAPAROSCOPIC CHOLECYSTECTOMY;  Surgeon: Vicenta DELENA Poli, MD;  Location: [facility];  Service: General;  Laterality: N/A;   TONSILLECTOMY     TUBAL LIGATION      OB History     Gravida  6   Para  0   Term  0   Preterm      AB  3   Living  3      SAB  3   IAB      Ectopic      Multiple      Live Births  3            Home Medications    Prior to Admission medications   Medication Sig Start Date End Date Taking? Authorizing Provider  acetaminophen  (TYLENOL ) 500 MG tablet Take 1 tablet (500 mg total) by mouth every 6 (six) hours as needed. 01/25/23   Charlyn Sora, MD  bacitracin  ointment Apply 1 Application topically 2 (two) times daily. 01/25/23   Charlyn Sora, MD  benzonatate  (TESSALON )  100 MG capsule Take 1 capsule (100 mg total) by mouth 3 (three) times daily as needed for cough. 04/09/23   Bernis Ernst, PA-C  cephALEXin  (KEFLEX ) 500 MG capsule Take 1 capsule (500 mg total) by mouth 4 (four) times daily. 11/02/22   Henderly, Britni A, PA-C  cetirizine  (ZYRTEC  ALLERGY) 10 MG tablet Take 1 tablet (10 mg total) by mouth daily. 12/14/21   Cockerham, Alicia M, PA-C  ibuprofen  (ADVIL ) 600 MG tablet Take 1 tablet (600 mg total) by mouth every 6 (six) hours as needed. 01/25/23   Charlyn Sora, MD  methocarbamol  (ROBAXIN ) 500 MG tablet Take 1 tablet (500 mg total) by mouth 4 (four) times daily. 08/03/23   Flint Sonny POUR, PA-C    Family History Family History  Problem Relation Age of Onset   Hypertension Father    Healthy Mother     Social History Social History   Tobacco Use   Smoking status: Every Day    Current packs/day: 1.00    Average packs/day: 1 pack/day for 8.0 years (8.0 ttl pk-yrs)    Types: Cigarettes   Smokeless tobacco: Never  Vaping Use   Vaping  status: Never Used  Substance Use Topics   Alcohol use: Yes    Alcohol/week: 0.0 standard drinks of alcohol    Comment: occasional   Drug use: No     Allergies   Patient has no known allergies.   Review of Systems Review of Systems  Constitutional:  Positive for chills, fatigue and fever.  Respiratory:  Negative for cough.   Gastrointestinal:  Negative for abdominal pain, diarrhea, nausea and vomiting.  Neurological:  Positive for weakness.  All other systems reviewed and are negative.    Physical Exam Triage Vital Signs ED Triage Vitals [12/23/23 1039]  Encounter Vitals Group     BP 132/85     Girls Systolic BP Percentile      Girls Diastolic BP Percentile      Boys Systolic BP Percentile      Boys Diastolic BP Percentile      Pulse Rate 83     Resp 18     Temp (!) 101.2 F (38.4 C)     Temp Source Oral     SpO2 94 %     Weight      Height      Head Circumference      Peak Flow       Pain Score      Pain Loc      Pain Education      Exclude from Growth Chart    No data found.  Updated Vital Signs BP 132/85 (BP Location: Left Arm)   Pulse 83   Temp (!) 101.2 F (38.4 C) (Oral)   Resp 18   SpO2 94%   Visual Acuity Right Eye Distance:   Left Eye Distance:   Bilateral Distance:    Right Eye Near:   Left Eye Near:    Bilateral Near:     Physical Exam Vitals and nursing note reviewed.  Constitutional:      General: She is not in acute distress.    Appearance: She is well-developed.  HENT:     Head: Normocephalic.     Right Ear: Tympanic membrane is retracted.     Left Ear: Tympanic membrane is retracted.     Nose: Congestion present.     Mouth/Throat:     Lips: Pink.     Mouth: Mucous membranes are moist.     Pharynx: Oropharynx is clear.  Eyes:     General: Lids are normal.     Conjunctiva/sclera: Conjunctivae normal.     Pupils: Pupils are equal, round, and reactive to light.  Neck:     Trachea: No tracheal deviation.  Cardiovascular:     Rate and Rhythm: Normal rate and regular rhythm.     Heart sounds: Normal heart sounds. No murmur heard. Pulmonary:     Effort: Pulmonary effort is normal.     Breath sounds: Normal breath sounds and air entry.  Abdominal:     General: Bowel sounds are normal.     Palpations: Abdomen is soft.     Tenderness: There is no abdominal tenderness.  Musculoskeletal:        General: Normal range of motion.     Cervical back: Normal range of motion.  Lymphadenopathy:     Cervical: No cervical adenopathy.  Skin:    General: Skin is warm and dry.     Findings: No rash.  Neurological:     General: No focal deficit present.     Mental Status: She is alert and oriented to  person, place, and time.     GCS: GCS eye subscore is 4. GCS verbal subscore is 5. GCS motor subscore is 6.  Psychiatric:        Attention and Perception: Attention normal.        Mood and Affect: Mood normal.        Speech: Speech normal.         Behavior: Behavior normal. Behavior is cooperative.      UC Treatments / Results  Labs (all labs ordered are listed, but only abnormal results are displayed) Labs Reviewed  POC COVID19/FLU A&B COMBO    EKG   Radiology No results found.  Procedures Procedures (including critical care time)  Medications Ordered in UC Medications  acetaminophen  (TYLENOL ) tablet 650 mg (650 mg Oral Given 12/23/23 1045)    Initial Impression / Assessment and Plan / UC Course  I have reviewed the triage vital signs and the nursing notes.  Pertinent labs & imaging results that were available during my care of the patient were reviewed by me and considered in my medical decision making (see chart for details).  Clinical Course as of 12/23/23 1123  Tue Dec 23, 2023  1116 Covid and flu is negative, tylenol  given for temp 101.2.  [JD]    Clinical Course User Index [JD] Gennie Dib, Rilla, NP   Discussed exam findings and plan of care with patient, Covid/flu both negative, strict go to ER precautions given. Patient verbalized understanding to this provider.  Ddx: Viral illness, allergies Final Clinical Impressions(s) / UC Diagnoses   Final diagnoses:  Nonspecific syndrome suggestive of viral illness     Discharge Instructions      Most likely you have a viral illness: no antibiotic is indicated at this time, May treat with OTC meds of choice(tylenol ,ibuprofen , chloraseptic throat lozenges, etc). Make sure to drink plenty of fluids to stay hydrated(gatorade, water, popsicles,jello,etc), avoid caffeine  products. Follow up with PCP. If you have new or worsening issues go to Er for further evaluation(chest pain,shortness of breath,palpitations, etc)     ED Prescriptions   None    PDMP not reviewed this encounter.   Aminta Rilla, NP 12/23/23 1123

## 2024-02-14 ENCOUNTER — Ambulatory Visit (INDEPENDENT_AMBULATORY_CARE_PROVIDER_SITE_OTHER): Payer: Self-pay

## 2024-02-14 ENCOUNTER — Encounter (HOSPITAL_COMMUNITY): Payer: Self-pay

## 2024-02-14 ENCOUNTER — Ambulatory Visit (HOSPITAL_COMMUNITY)
Admission: EM | Admit: 2024-02-14 | Discharge: 2024-02-14 | Disposition: A | Discharge status: Home / Self Care | Payer: Self-pay | Attending: Family Medicine | Admitting: Family Medicine

## 2024-02-14 DIAGNOSIS — M25562 Pain in left knee: Secondary | ICD-10-CM

## 2024-02-14 DIAGNOSIS — M546 Pain in thoracic spine: Secondary | ICD-10-CM

## 2024-02-14 MED ORDER — NAPROXEN 500 MG PO TABS: 500.0000 mg | ORAL_TABLET | Freq: Two times a day (BID) | ORAL | 0 refills | Status: AC

## 2024-02-14 MED ORDER — TIZANIDINE HCL 4 MG PO TABS: 4.0000 mg | ORAL_TABLET | Freq: Three times a day (TID) | ORAL | 0 refills | Status: AC | PRN

## 2024-02-14 MED ORDER — IBUPROFEN 800 MG PO TABS
800.0000 mg | ORAL_TABLET | Freq: Once | ORAL | Status: AC
  Administered 2024-02-14: 800 mg via ORAL

## 2024-02-14 MED ORDER — IBUPROFEN 800 MG PO TABS
ORAL_TABLET | ORAL | Status: AC
  Filled 2024-02-14: qty 1

## 2024-02-14 NOTE — ED Provider Notes (Signed)
 [facility]    CSN: [id number] Arrival date & time: 02/14/24  1039      History   Chief Complaint Chief Complaint  Patient presents with   Back Pain   Knee Pain    HPI Sabrina Harding is a 43 y.o. female.    Back Pain Knee Pain Associated symptoms: back pain    Patient is here for knee and back pain.  Started with left knee pain several days ago.  No known injury.  She states she does have h/o left knee issues.  She thinks she has swelling.  Pain is mostly with standing and walking.  She then started right mid back pain yesterday.  This is more painful with walking.  She did take motrin  for pain without much help.        Past Medical History:  Diagnosis Date   Anemia    Gall stones    Gonorrhea    Headache(784.0)    Heart murmur    childhood   Trichomonas contact     Patient Active Problem List   Diagnosis Date Noted   Nonspecific syndrome suggestive of viral illness 12/23/2023   MDD (major depressive disorder) 09/17/2020   Severe major depression (HCC) 09/16/2020   Oral herpes 01/10/2016   Pelvic inflammatory disease (PID) 01/07/2016   Gonorrhea in female 01/07/2016   Pyelonephritis 01/06/2016    Past Surgical History:  Procedure Laterality Date   CHOLECYSTECTOMY  10/17/2011   Procedure: LAPAROSCOPIC CHOLECYSTECTOMY;  Surgeon: Vicenta DELENA Poli, MD;  Location: [facility];  Service: General;  Laterality: N/A;   TONSILLECTOMY     TUBAL LIGATION      OB History     Gravida  6   Para  0   Term  0   Preterm      AB  3   Living  3      SAB  3   IAB      Ectopic      Multiple      Live Births  3            Home Medications    Prior to Admission medications   Medication Sig Start Date End Date Taking? Authorizing Provider  acetaminophen  (TYLENOL ) 500 MG tablet Take 1 tablet (500 mg total) by mouth every 6 (six) hours as needed. 01/25/23   Charlyn Sora, MD  cetirizine  (ZYRTEC  ALLERGY) 10 MG tablet Take 1  tablet (10 mg total) by mouth daily. 12/14/21   Renae Bernarda HERO, PA-C    Family History Family History  Problem Relation Age of Onset   Hypertension Father    Healthy Mother     Social History Social History   Tobacco Use   Smoking status: Every Day    Current packs/day: 1.00    Average packs/day: 1 pack/day for 8.0 years (8.0 ttl pk-yrs)    Types: Cigarettes   Smokeless tobacco: Never  Vaping Use   Vaping status: Never Used  Substance Use Topics   Alcohol use: Yes    Alcohol/week: 0.0 standard drinks of alcohol    Comment: occasional   Drug use: No     Allergies   Patient has no known allergies.   Review of Systems Review of Systems  Constitutional: Negative.   HENT: Negative.    Respiratory: Negative.    Cardiovascular: Negative.   Gastrointestinal: Negative.   Genitourinary: Negative.   Musculoskeletal:  Positive for arthralgias and back pain.     Physical  Exam Triage Vital Signs ED Triage Vitals  Encounter Vitals Group     BP 02/14/24 1101 124/87     Girls Systolic BP Percentile --      Girls Diastolic BP Percentile --      Boys Systolic BP Percentile --      Boys Diastolic BP Percentile --      Pulse Rate 02/14/24 1101 89     Resp 02/14/24 1101 16     Temp 02/14/24 1101 98.6 F (37 C)     Temp Source 02/14/24 1101 Oral     SpO2 02/14/24 1101 94 %     Weight --      Height --      Head Circumference --      Peak Flow --      Pain Score 02/14/24 1059 8     Pain Loc --      Pain Education --      Exclude from Growth Chart --    No data found.  Updated Vital Signs BP 124/87 (BP Location: Right Arm)   Pulse 89   Temp 98.6 F (37 C) (Oral)   Resp 16   LMP  (LMP Unknown) Comment: LMP age 36. unknown cause  SpO2 94%   Visual Acuity Right Eye Distance:   Left Eye Distance:   Bilateral Distance:    Right Eye Near:   Left Eye Near:    Bilateral Near:     Physical Exam Constitutional:      General: She is not in acute distress.     Appearance: Normal appearance. She is normal weight. She is not ill-appearing or toxic-appearing.  Cardiovascular:     Rate and Rhythm: Normal rate and regular rhythm.  Pulmonary:     Effort: Pulmonary effort is normal.     Breath sounds: Normal breath sounds.  Musculoskeletal:     Comments: +spinous tenderness to the mid back;  +TTP to the right lateral rib area;  The left knee appears swollen; no redness/warmth noted;  there is TTP to the lateral knee, and superiorly/inferiorly as well;  limited ROM with flexion/extension due to pain.   Neurological:     General: No focal deficit present.     Mental Status: She is alert.  Psychiatric:        Mood and Affect: Mood normal.      UC Treatments / Results  Labs (all labs ordered are listed, but only abnormal results are displayed) Labs Reviewed - No data to display  EKG   Radiology No results found.  Procedures Procedures (including critical care time)  Medications Ordered in UC Medications  ibuprofen  (ADVIL ) tablet 800 mg (has no administration in time range)    Initial Impression / Assessment and Plan / UC Course  I have reviewed the triage vital signs and the nursing notes.  Pertinent labs & imaging results that were available during my care of the patient were reviewed by me and considered in my medical decision making (see chart for details).    Final Clinical Impressions(s) / UC Diagnoses   Final diagnoses:  Acute pain of left knee  Acute right-sided thoracic back pain     Discharge Instructions      You were seen today for knee pain and back pain.  Your knee xray appears normal today.  If the radiologist reads this differently we will notify you.  I have given you motrin  today . I have sent out an anti-inflammatory to take twice/day,  and a muscle relaxer as well for your back.  This may make you tired/drowsy so please take when home and not driving.  If you continue with symptoms, you should follow up with  an orthopedist.  You may call [facility] at [phone] for an appointment.     ED Prescriptions     Medication Sig Dispense Auth. Provider   tiZANidine (ZANAFLEX) 4 MG tablet Take 1 tablet (4 mg total) by mouth every 8 (eight) hours as needed. 30 tablet Kamarie Veno, MD   naproxen  (NAPROSYN ) 500 MG tablet Take 1 tablet (500 mg total) by mouth 2 (two) times daily. 30 tablet Darral Longs, MD      PDMP not reviewed this encounter.   Darral Longs, MD 02/14/24 1209

## 2024-02-14 NOTE — ED Triage Notes (Signed)
 Patient here today with c/o left knee pain X 2 days. Patient has increased pain with weightbearing. Patient has taken Tylenol  and Ibuprofen  with no relief. No known injury but does having a h/o left knee pain.   Patient also started having right side low back pain yesterday.

## 2024-02-14 NOTE — Discharge Instructions (Signed)
 You were seen today for knee pain and back pain.  Your knee xray appears normal today.  If the radiologist reads this differently we will notify you.  I have given you motrin  today . I have sent out an anti-inflammatory to take twice/day, and a muscle relaxer as well for your back.  This may make you tired/drowsy so please take when home and not driving.  If you continue with symptoms, you should follow up with an orthopedist.  You may call [facility] at [phone] for an appointment.

## 2024-04-02 ENCOUNTER — Emergency Department (HOSPITAL_COMMUNITY)
Admission: EM | Admit: 2024-04-02 | Discharge: 2024-04-03 | Disposition: A | Discharge status: Home / Self Care | Payer: Self-pay | Attending: Emergency Medicine | Admitting: Emergency Medicine

## 2024-04-02 ENCOUNTER — Other Ambulatory Visit: Payer: Self-pay

## 2024-04-02 DIAGNOSIS — R519 Headache, unspecified: Secondary | ICD-10-CM

## 2024-04-02 DIAGNOSIS — G43809 Other migraine, not intractable, without status migrainosus: Secondary | ICD-10-CM | POA: Insufficient documentation

## 2024-04-02 NOTE — ED Triage Notes (Signed)
 Pt complains of right sided facial pain and swelling that radiates under chin and around to left side of face. Started yesterday. Denies injury.

## 2024-04-02 NOTE — ED Triage Notes (Signed)
 Patient reports facial pain and swelling on the right side.

## 2024-04-03 LAB — GROUP A STREP BY PCR: Group A Strep by PCR: NOT DETECTED

## 2024-04-03 MED ORDER — PENICILLIN V POTASSIUM 500 MG PO TABS: 500.0000 mg | ORAL_TABLET | Freq: Four times a day (QID) | ORAL | 0 refills | Status: AC

## 2024-04-03 MED ORDER — DEXAMETHASONE SOD PHOSPHATE PF 10 MG/ML IJ SOLN
10.0000 mg | Freq: Once | INTRAMUSCULAR | Status: AC
  Administered 2024-04-03: 10 mg via INTRAMUSCULAR

## 2024-04-03 MED ORDER — KETOROLAC TROMETHAMINE 15 MG/ML IJ SOLN
15.0000 mg | Freq: Once | INTRAMUSCULAR | Status: AC
  Administered 2024-04-03: 15 mg via INTRAMUSCULAR
  Filled 2024-04-03: qty 1

## 2024-04-03 NOTE — Discharge Instructions (Addendum)
 Take antibiotics as prescribed. You can gargle warm salt water, drink warm tea with honey, take the liquid version of Motrin  and Tylenol  for your throat pain. Recheck with your primary care provider.  If you do not have a primary care provider, please follow-up with [facility].  Consider follow-up with dentist. Return to the emergency room for severe concerning symptoms.

## 2024-04-03 NOTE — ED Provider Notes (Signed)
 " [place] [facility] AT [facility] Provider Note   CSN: [id number] Arrival date & time: 04/02/24  8095     Patient presents with: Facial Pain   Sabrina Harding is a 43 y.o. female.   43 year old female presents emergency room with complaint of migraine with right sided facial pain.  Symptoms started yesterday, progressively worsening and now with right sided sore throat and a tender knot located on the right side of her jaw.  She denies difficulty breathing or swallowing, fevers, any other complaints or concerns.       Prior to Admission medications  Medication Sig Start Date End Date Taking? Authorizing Provider  penicillin  v potassium (VEETID) 500 MG tablet Take 1 tablet (500 mg total) by mouth 4 (four) times daily for 10 days. 04/03/24 04/13/24 Yes Beverley Leita LABOR, PA-C  acetaminophen  (TYLENOL ) 500 MG tablet Take 1 tablet (500 mg total) by mouth every 6 (six) hours as needed. 01/25/23   Charlyn Sora, MD  cetirizine  (ZYRTEC  ALLERGY) 10 MG tablet Take 1 tablet (10 mg total) by mouth daily. 12/14/21   Renae Bernarda HERO, PA-C  naproxen  (NAPROSYN ) 500 MG tablet Take 1 tablet (500 mg total) by mouth 2 (two) times daily. 02/14/24   Piontek, Rocky, MD  tiZANidine  (ZANAFLEX ) 4 MG tablet Take 1 tablet (4 mg total) by mouth every 8 (eight) hours as needed. 02/14/24   Darral Rocky, MD    Allergies: Patient has no known allergies.    Review of Systems Negative except as per Updated Vital Signs BP (!) 157/110 (BP Location: Right Arm)   Pulse 65   Temp 98.2 F (36.8 C) (Oral)   Resp 18   Wt 95.3 kg   SpO2 100%   BMI 34.96 kg/m   Physical Exam Vitals and nursing note reviewed.  Constitutional:      General: She is not in acute distress.    Appearance: She is well-developed. She is not diaphoretic.  HENT:     Head: Normocephalic and atraumatic.     Jaw: No trismus.      Nose: Nose normal.     Mouth/Throat:     Mouth: Mucous membranes are moist.       Comments: Notable decay Eyes:     Conjunctiva/sclera: Conjunctivae normal.  Pulmonary:     Effort: Pulmonary effort is normal.  Musculoskeletal:     Cervical back: Neck supple. No tenderness.  Lymphadenopathy:     Cervical: No cervical adenopathy.  Skin:    General: Skin is warm and dry.     Findings: No erythema or rash.  Neurological:     Mental Status: She is alert and oriented to person, place, and time.  Psychiatric:        Behavior: Behavior normal.     (all labs ordered are listed, but only abnormal results are displayed) Labs Reviewed  GROUP A STREP BY PCR    EKG: None  Radiology: No results found.   Procedures   Medications Ordered in the ED  ketorolac  (TORADOL ) 15 MG/ML injection 15 mg (15 mg Intramuscular Given 04/03/24 0324)  dexamethasone  (DECADRON ) injection 10 mg (10 mg Intramuscular Given 04/03/24 0324)                                    Medical Decision Making  43 year old female with complaint of migraine, right sided facial pain, not along the right mandible and  sore throat.  She is found have decayed right lower molar, unsure if related to her acute symptoms.  Throat appears clear. Neurologically intact. Plan is for Decadron  and Toradol  for her migraine, may also help with her facial pain and sore throat. Provided with strep swab, can follow-up in MyChart later for results as she is provided with penicillin  for the dental problem which would cover strep. Referred to dental list for further care.  Strep negative.     Final diagnoses:  Facial pain  Other migraine without status migrainosus, not intractable    ED Discharge Orders          Ordered    penicillin  v potassium (VEETID) 500 MG tablet  4 times daily        04/03/24 0300               Beverley Leita LABOR, PA-C 04/03/24 0359    Theadore Ozell HERO, MD 04/03/24 [phone]  "
